# Patient Record
Sex: Female | Born: 1945 | Race: White | Hispanic: No | Smoking: Current some day smoker
Health system: Southern US, Community
[De-identification: ages and names within clinical notes are randomized; demographics above are authoritative.]

## PROBLEM LIST (undated history)

## (undated) ENCOUNTER — Emergency Department (HOSPITAL_COMMUNITY): Admission: EM | Source: Home / Self Care

## (undated) DIAGNOSIS — K219 Gastro-esophageal reflux disease without esophagitis: Secondary | ICD-10-CM

## (undated) DIAGNOSIS — R058 Other specified cough: Secondary | ICD-10-CM

## (undated) DIAGNOSIS — M1990 Unspecified osteoarthritis, unspecified site: Secondary | ICD-10-CM

## (undated) DIAGNOSIS — E785 Hyperlipidemia, unspecified: Secondary | ICD-10-CM

## (undated) DIAGNOSIS — K429 Umbilical hernia without obstruction or gangrene: Secondary | ICD-10-CM

## (undated) DIAGNOSIS — J449 Chronic obstructive pulmonary disease, unspecified: Secondary | ICD-10-CM

## (undated) DIAGNOSIS — F40240 Claustrophobia: Secondary | ICD-10-CM

## (undated) DIAGNOSIS — I739 Peripheral vascular disease, unspecified: Secondary | ICD-10-CM

## (undated) DIAGNOSIS — I82409 Acute embolism and thrombosis of unspecified deep veins of unspecified lower extremity: Secondary | ICD-10-CM

## (undated) DIAGNOSIS — E119 Type 2 diabetes mellitus without complications: Secondary | ICD-10-CM

## (undated) DIAGNOSIS — F32A Depression, unspecified: Secondary | ICD-10-CM

## (undated) DIAGNOSIS — R001 Bradycardia, unspecified: Secondary | ICD-10-CM

## (undated) DIAGNOSIS — R0989 Other specified symptoms and signs involving the circulatory and respiratory systems: Secondary | ICD-10-CM

## (undated) DIAGNOSIS — M79606 Pain in leg, unspecified: Secondary | ICD-10-CM

## (undated) DIAGNOSIS — I2510 Atherosclerotic heart disease of native coronary artery without angina pectoris: Secondary | ICD-10-CM

## (undated) DIAGNOSIS — I6529 Occlusion and stenosis of unspecified carotid artery: Secondary | ICD-10-CM

## (undated) DIAGNOSIS — F329 Major depressive disorder, single episode, unspecified: Secondary | ICD-10-CM

## (undated) DIAGNOSIS — R05 Cough: Secondary | ICD-10-CM

## (undated) DIAGNOSIS — F419 Anxiety disorder, unspecified: Secondary | ICD-10-CM

## (undated) HISTORY — DX: Atherosclerotic heart disease of native coronary artery without angina pectoris: I25.10

## (undated) HISTORY — DX: Pain in leg, unspecified: M79.606

## (undated) HISTORY — DX: Acute embolism and thrombosis of unspecified deep veins of unspecified lower extremity: I82.409

## (undated) HISTORY — DX: Bradycardia, unspecified: R00.1

## (undated) HISTORY — PX: CYSTECTOMY: SUR359

## (undated) HISTORY — DX: Unspecified osteoarthritis, unspecified site: M19.90

## (undated) HISTORY — PX: PAROTIDECTOMY: SUR1003

## (undated) HISTORY — PX: CARDIAC CATHETERIZATION: SHX172

## (undated) HISTORY — DX: Chronic obstructive pulmonary disease, unspecified: J44.9

## (undated) HISTORY — DX: Hyperlipidemia, unspecified: E78.5

## (undated) HISTORY — DX: Occlusion and stenosis of unspecified carotid artery: I65.29

## (undated) HISTORY — PX: TONSILLECTOMY AND ADENOIDECTOMY: SUR1326

## (undated) HISTORY — DX: Peripheral vascular disease, unspecified: I73.9

## (undated) HISTORY — DX: Other specified symptoms and signs involving the circulatory and respiratory systems: R09.89

## (undated) HISTORY — DX: Cough: R05

## (undated) HISTORY — DX: Other specified cough: R05.8

## (undated) HISTORY — PX: OTHER SURGICAL HISTORY: SHX169

---

## 1966-05-29 HISTORY — PX: ABDOMINAL HYSTERECTOMY: SHX81

## 1966-05-29 HISTORY — PX: SALPINGOOPHORECTOMY: SHX82

## 1975-05-30 HISTORY — PX: APPENDECTOMY: SHX54

## 2002-04-23 ENCOUNTER — Ambulatory Visit (HOSPITAL_COMMUNITY): Admission: RE | Admit: 2002-04-23 | Discharge: 2002-04-23 | Payer: Self-pay | Admitting: Pulmonary Disease

## 2002-05-01 ENCOUNTER — Ambulatory Visit (HOSPITAL_COMMUNITY): Admission: RE | Admit: 2002-05-01 | Discharge: 2002-05-01 | Payer: Self-pay | Admitting: Pulmonary Disease

## 2002-12-27 ENCOUNTER — Ambulatory Visit (HOSPITAL_COMMUNITY): Admission: RE | Admit: 2002-12-27 | Discharge: 2002-12-27 | Payer: Self-pay | Admitting: Pulmonary Disease

## 2003-06-06 ENCOUNTER — Emergency Department (HOSPITAL_COMMUNITY): Admission: EM | Admit: 2003-06-06 | Discharge: 2003-06-06 | Payer: Self-pay | Admitting: Emergency Medicine

## 2003-06-06 ENCOUNTER — Encounter: Payer: Self-pay | Admitting: Emergency Medicine

## 2003-06-18 ENCOUNTER — Ambulatory Visit (HOSPITAL_COMMUNITY): Admission: RE | Admit: 2003-06-18 | Discharge: 2003-06-18 | Payer: Self-pay | Admitting: Pulmonary Disease

## 2003-12-27 ENCOUNTER — Ambulatory Visit (HOSPITAL_COMMUNITY): Admission: RE | Admit: 2003-12-27 | Discharge: 2003-12-27 | Payer: Self-pay | Admitting: Pulmonary Disease

## 2005-05-10 ENCOUNTER — Emergency Department (HOSPITAL_COMMUNITY): Admission: EM | Admit: 2005-05-10 | Discharge: 2005-05-10 | Payer: Self-pay | Admitting: Emergency Medicine

## 2005-08-25 ENCOUNTER — Other Ambulatory Visit: Admission: RE | Admit: 2005-08-25 | Discharge: 2005-08-25 | Payer: Self-pay | Admitting: Otolaryngology

## 2005-09-13 ENCOUNTER — Other Ambulatory Visit: Admission: RE | Admit: 2005-09-13 | Discharge: 2005-09-13 | Payer: Self-pay | Admitting: Otolaryngology

## 2005-11-08 ENCOUNTER — Ambulatory Visit (HOSPITAL_COMMUNITY): Admission: RE | Admit: 2005-11-08 | Discharge: 2005-11-08 | Payer: Self-pay | Admitting: Otolaryngology

## 2006-01-24 ENCOUNTER — Ambulatory Visit (HOSPITAL_COMMUNITY): Admission: RE | Admit: 2006-01-24 | Discharge: 2006-01-24 | Payer: Self-pay | Admitting: Otolaryngology

## 2007-01-10 ENCOUNTER — Ambulatory Visit (HOSPITAL_COMMUNITY): Admission: RE | Admit: 2007-01-10 | Discharge: 2007-01-10 | Payer: Self-pay | Admitting: Pulmonary Disease

## 2007-03-25 ENCOUNTER — Ambulatory Visit: Payer: Self-pay | Admitting: Cardiology

## 2007-03-25 ENCOUNTER — Inpatient Hospital Stay (HOSPITAL_COMMUNITY): Admission: EM | Admit: 2007-03-25 | Discharge: 2007-03-27 | Payer: Self-pay | Admitting: Cardiology

## 2007-03-27 ENCOUNTER — Encounter: Payer: Self-pay | Admitting: Cardiology

## 2007-04-03 ENCOUNTER — Encounter: Payer: Self-pay | Admitting: Physician Assistant

## 2007-04-03 ENCOUNTER — Ambulatory Visit: Payer: Self-pay | Admitting: Cardiology

## 2007-05-09 ENCOUNTER — Encounter: Payer: Self-pay | Admitting: Physician Assistant

## 2007-12-03 ENCOUNTER — Encounter: Payer: Self-pay | Admitting: Cardiology

## 2008-01-04 ENCOUNTER — Ambulatory Visit (HOSPITAL_COMMUNITY): Admission: RE | Admit: 2008-01-04 | Discharge: 2008-01-04 | Payer: Self-pay | Admitting: Pulmonary Disease

## 2008-03-11 ENCOUNTER — Encounter (INDEPENDENT_AMBULATORY_CARE_PROVIDER_SITE_OTHER): Payer: Self-pay | Admitting: Interventional Radiology

## 2008-03-11 ENCOUNTER — Ambulatory Visit (HOSPITAL_COMMUNITY): Admission: RE | Admit: 2008-03-11 | Discharge: 2008-03-11 | Payer: Self-pay | Admitting: Otolaryngology

## 2008-05-16 ENCOUNTER — Inpatient Hospital Stay (HOSPITAL_COMMUNITY): Admission: AD | Admit: 2008-05-16 | Discharge: 2008-05-17 | Payer: Self-pay | Admitting: Otolaryngology

## 2008-05-16 ENCOUNTER — Encounter (INDEPENDENT_AMBULATORY_CARE_PROVIDER_SITE_OTHER): Payer: Self-pay | Admitting: Otolaryngology

## 2009-04-14 ENCOUNTER — Ambulatory Visit (HOSPITAL_COMMUNITY): Admission: RE | Admit: 2009-04-14 | Discharge: 2009-04-14 | Payer: Self-pay | Admitting: Pulmonary Disease

## 2009-06-12 IMAGING — CR DG LUMBAR SPINE COMPLETE 4+V
5 series · 5 of 5 positions shown · non-contrast
Comparison: CT abdomen 01/10/2007

CLINICAL DATA: Back pain, bilateral leg pain

LUMBAR SPINE - COMPLETE 4+ VIEW

[view not recorded (1 of 5)]
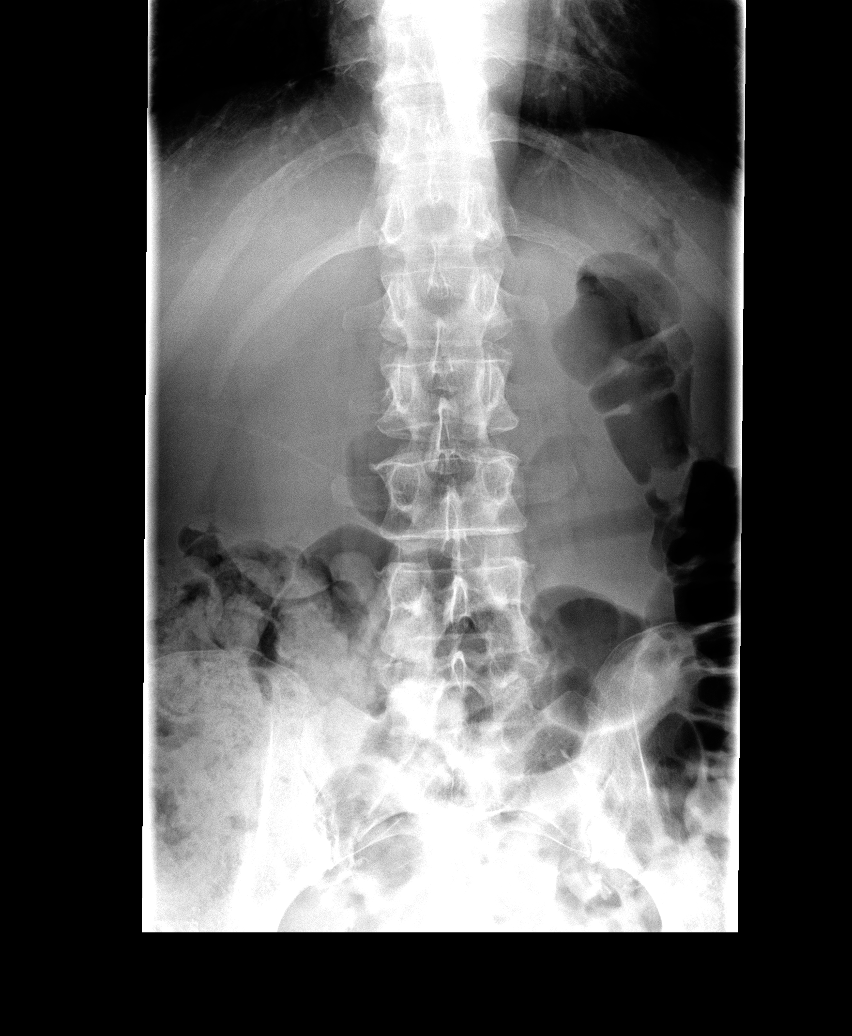

[view not recorded (2 of 5)]
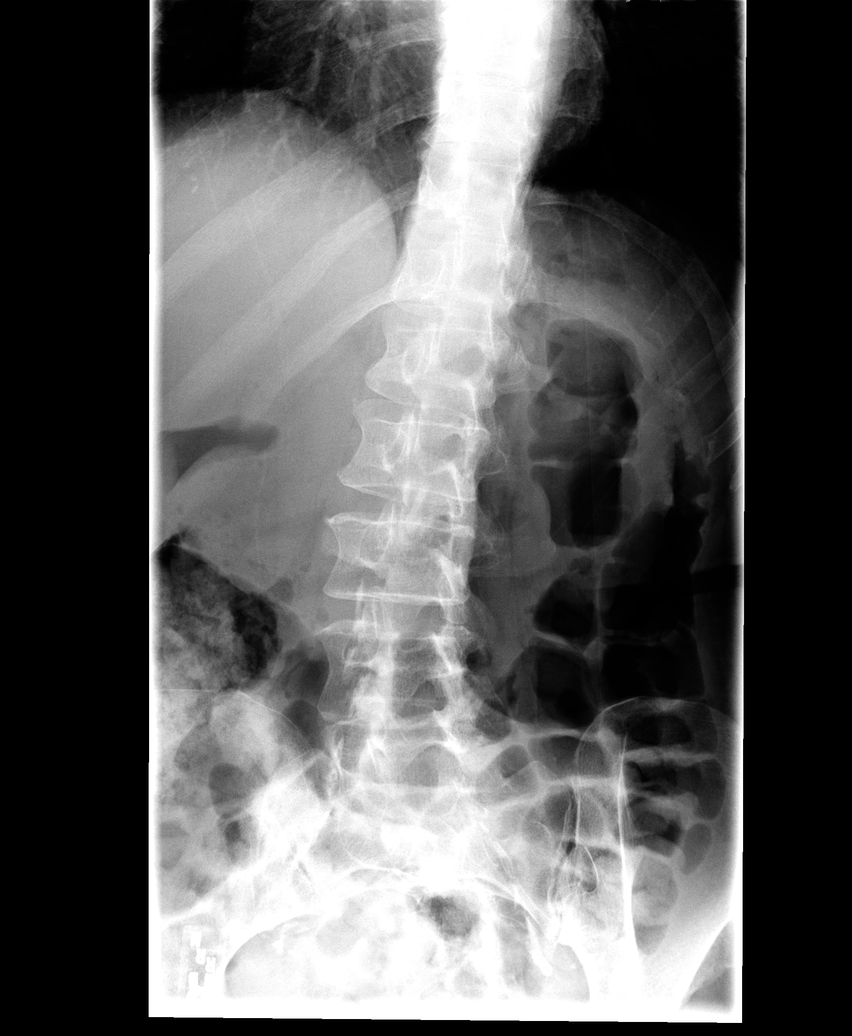

[view not recorded (3 of 5)]
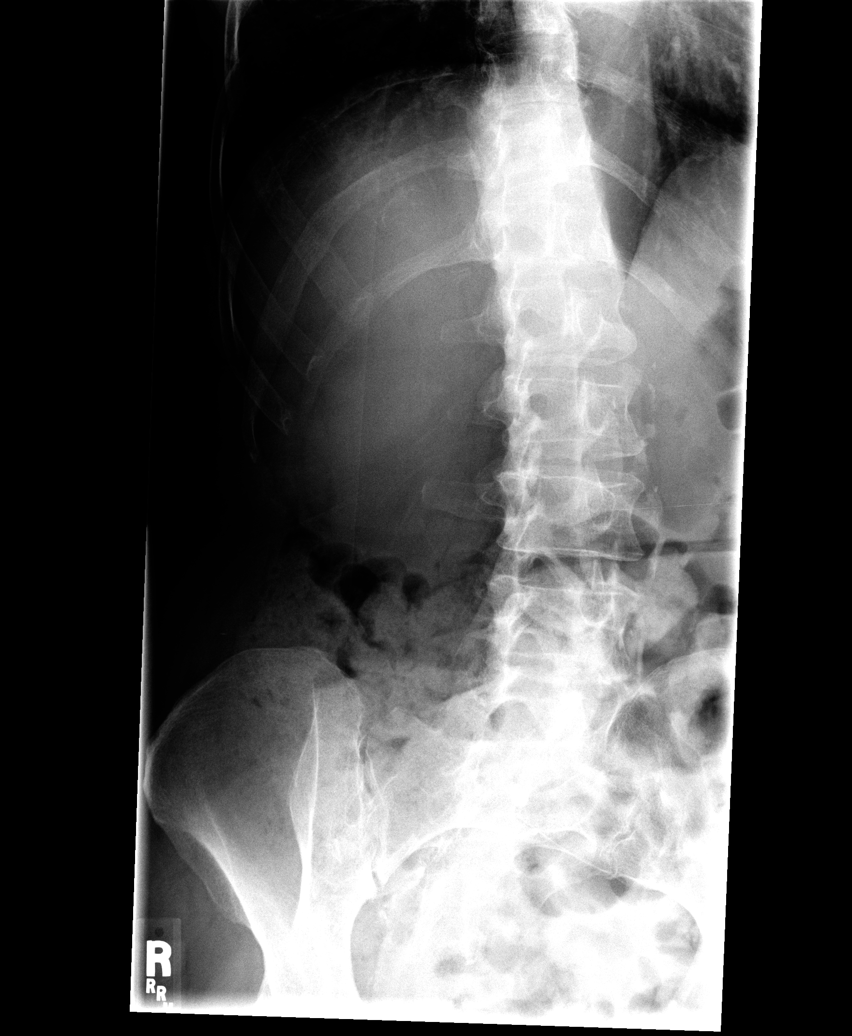

[view not recorded (4 of 5)]
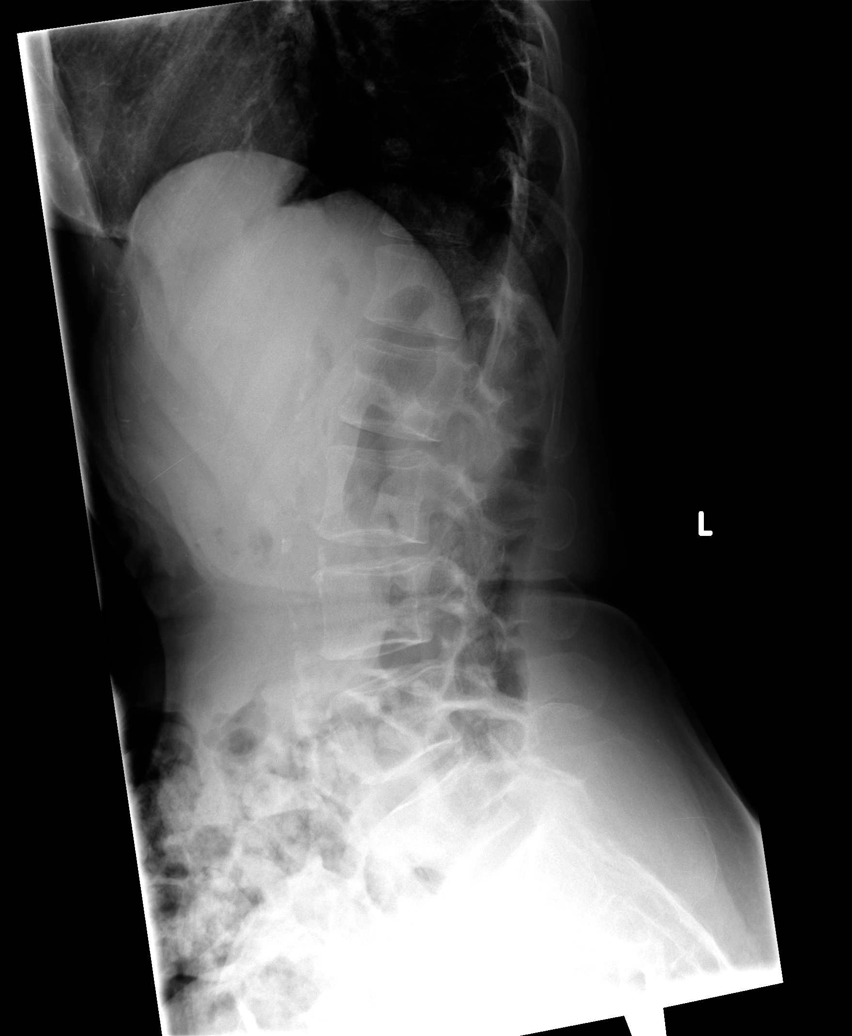

[view not recorded (5 of 5)]
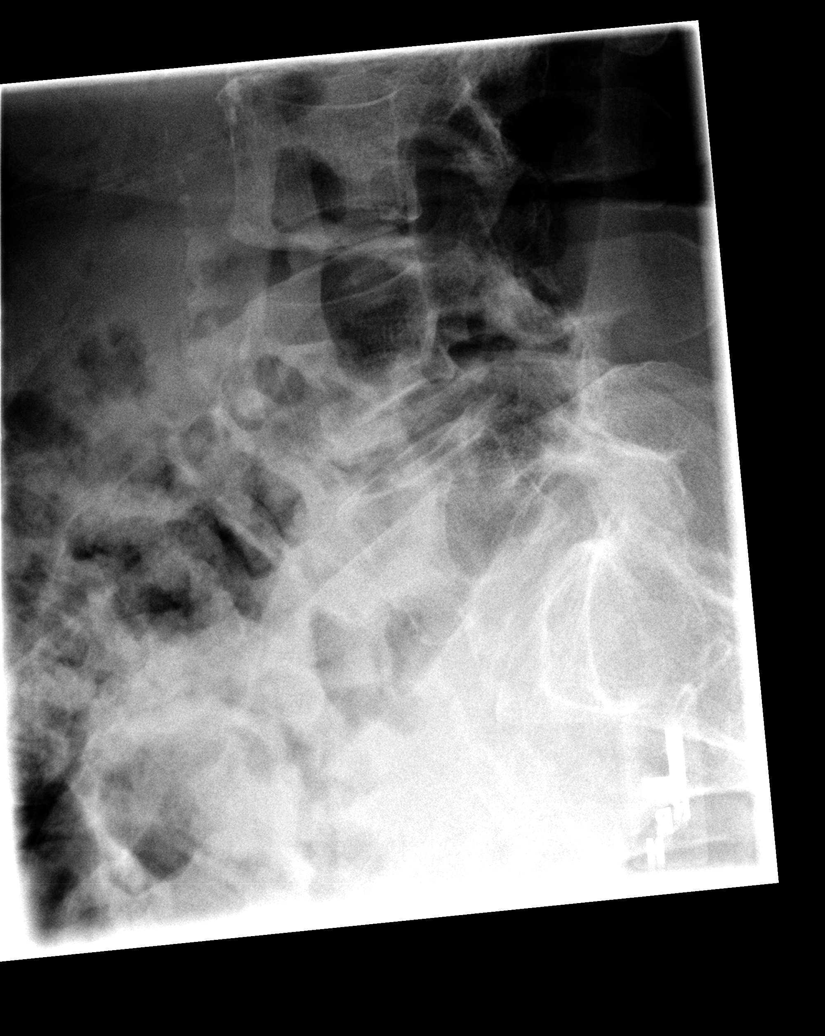

[5 of 5 positions shown; findings below may reference images not displayed]

FINDINGS: There are five lumbar-type vertebra.  No evidence of
subluxation on lateral projection.  No loss of disc height or
vertebral body height.  No evidence of pars defect on lateral
projection.  Calcification abdominal aorta noted.
IMPRESSION: No acute findings of the lumbar spine.

## 2009-07-08 ENCOUNTER — Ambulatory Visit (HOSPITAL_COMMUNITY): Admission: RE | Admit: 2009-07-08 | Discharge: 2009-07-08 | Payer: Self-pay | Admitting: Pulmonary Disease

## 2009-08-06 ENCOUNTER — Encounter: Payer: Self-pay | Admitting: Cardiology

## 2009-08-06 ENCOUNTER — Ambulatory Visit: Payer: Self-pay | Admitting: Vascular Surgery

## 2009-08-22 ENCOUNTER — Ambulatory Visit (HOSPITAL_COMMUNITY): Admission: RE | Admit: 2009-08-22 | Discharge: 2009-08-22 | Payer: Self-pay | Admitting: Vascular Surgery

## 2009-08-22 ENCOUNTER — Ambulatory Visit: Payer: Self-pay | Admitting: Vascular Surgery

## 2009-09-10 ENCOUNTER — Ambulatory Visit: Payer: Self-pay | Admitting: Vascular Surgery

## 2009-09-10 ENCOUNTER — Encounter: Payer: Self-pay | Admitting: Cardiology

## 2010-03-25 ENCOUNTER — Ambulatory Visit: Payer: Self-pay | Admitting: Vascular Surgery

## 2010-04-09 ENCOUNTER — Encounter (INDEPENDENT_AMBULATORY_CARE_PROVIDER_SITE_OTHER): Payer: Self-pay | Admitting: *Deleted

## 2010-04-09 ENCOUNTER — Ambulatory Visit: Payer: Self-pay | Admitting: Cardiology

## 2010-04-09 DIAGNOSIS — R072 Precordial pain: Secondary | ICD-10-CM

## 2010-04-09 DIAGNOSIS — R42 Dizziness and giddiness: Secondary | ICD-10-CM

## 2010-04-09 DIAGNOSIS — E119 Type 2 diabetes mellitus without complications: Secondary | ICD-10-CM | POA: Insufficient documentation

## 2010-04-09 DIAGNOSIS — I6529 Occlusion and stenosis of unspecified carotid artery: Secondary | ICD-10-CM

## 2010-04-09 DIAGNOSIS — I7389 Other specified peripheral vascular diseases: Secondary | ICD-10-CM

## 2010-04-14 ENCOUNTER — Encounter: Payer: Self-pay | Admitting: Cardiology

## 2010-04-14 ENCOUNTER — Ambulatory Visit: Payer: Self-pay | Admitting: Cardiology

## 2010-04-17 ENCOUNTER — Telehealth (INDEPENDENT_AMBULATORY_CARE_PROVIDER_SITE_OTHER): Payer: Self-pay | Admitting: *Deleted

## 2010-05-05 ENCOUNTER — Ambulatory Visit (HOSPITAL_COMMUNITY): Admission: RE | Admit: 2010-05-05 | Discharge: 2010-05-05 | Payer: Self-pay | Admitting: Pulmonary Disease

## 2010-05-21 ENCOUNTER — Ambulatory Visit: Payer: Self-pay | Admitting: Cardiology

## 2010-10-07 ENCOUNTER — Ambulatory Visit (HOSPITAL_COMMUNITY)
Admission: RE | Admit: 2010-10-07 | Discharge: 2010-10-07 | Payer: Self-pay | Source: Home / Self Care | Admitting: Pulmonary Disease

## 2010-10-07 ENCOUNTER — Encounter: Payer: Self-pay | Admitting: Cardiology

## 2010-10-20 ENCOUNTER — Ambulatory Visit: Payer: Self-pay | Admitting: Cardiology

## 2010-10-20 DIAGNOSIS — I82409 Acute embolism and thrombosis of unspecified deep veins of unspecified lower extremity: Secondary | ICD-10-CM | POA: Insufficient documentation

## 2010-12-16 ENCOUNTER — Ambulatory Visit
Admission: RE | Admit: 2010-12-16 | Discharge: 2010-12-16 | Payer: Self-pay | Source: Home / Self Care | Attending: Gastroenterology | Admitting: Gastroenterology

## 2010-12-16 ENCOUNTER — Encounter: Payer: Self-pay | Admitting: Internal Medicine

## 2010-12-16 ENCOUNTER — Ambulatory Visit (HOSPITAL_COMMUNITY)
Admission: RE | Admit: 2010-12-16 | Discharge: 2010-12-16 | Payer: Self-pay | Source: Home / Self Care | Attending: Internal Medicine | Admitting: Internal Medicine

## 2010-12-16 DIAGNOSIS — K5900 Constipation, unspecified: Secondary | ICD-10-CM | POA: Insufficient documentation

## 2010-12-17 ENCOUNTER — Encounter: Payer: Self-pay | Admitting: Internal Medicine

## 2010-12-25 LAB — BASIC METABOLIC PANEL
CO2: 30 mEq/L (ref 19–32)
Calcium: 9.5 mg/dL (ref 8.4–10.5)
Chloride: 95 mEq/L — ABNORMAL LOW (ref 96–112)
GFR calc Af Amer: >60 mL/min (ref >60)
Glucose, Bld: 139 mg/dL — ABNORMAL HIGH (ref 70–99)
Potassium: 4.8 mEq/L (ref 3.5–5.1)
Sodium: 134 mEq/L — ABNORMAL LOW (ref 135–145)

## 2010-12-29 NOTE — Letter (Signed)
Summary: [facility] D/C   [facility] D/C   Imported By: Zachary George 04/09/2010 09:49:42  _____________________________________________________________________  External Attachment:    Type:   Image     Comment:   External Document

## 2010-12-29 NOTE — Progress Notes (Signed)
Summary: panic attacks   Phone Note Call from Patient   Summary of Call: Message left on voice mail wanting to know if anything in stress test would cause her to have panic attacks.  Left message to return call.  Melissa Brunette, LPN  Apr 17, 2010 3:49 PM     Follow-up for Phone Call        Patient notified to f/u with PMD regarding her panic attacks.  States she will see her PMD on Monday.   (Dr. [name])

## 2010-12-29 NOTE — Assessment & Plan Note (Signed)
Summary: f/u - discuss poss. further testing  --agh   Visit Type:  Follow-up Primary Provider:  [name]   History of Present Illness: the patient is a 65 year old female with history of single vessel coronary artery disease with an occlusion of the RCA and distal collaterals. The patient also hypertension diabetes mellitus. She has severe peripheral vascular disease with right leg claudication. She also has left carotid artery disease and she is followed by Dr. Darrick Penna in [place].  The patient reports occasional atypical chest pain. However she reports no angina on exertion. Had a recent myocardial perfusion imaging study done which was somewhat nondiagnostic. There was a small fixed defect in the apical anterior and apical segment. However this was a small defect. There was also small defect in the mid inferior wall consistent with her prior history of RCA occlusion. Ejection fraction however is preserved at 67%. The patient describes some pain in the left side of the chest consistent musculoskeletal pain. She denies any orthopnea PND palpitations or syncope.  Preventive Screening-Counseling & Management  Alcohol-Tobacco     Smoking Status: current     Smoking Cessation Counseling: yes     Packs/Day: 1/2 PPD  Current Medications (verified): 1)  Simvastatin 40 Mg Tabs (Simvastatin) .... Take One Tablet By Mouth Daily At Bedtime 2)  Metformin Hcl 500 Mg Tabs (Metformin Hcl) .... Take 2 Tablet By Mouth Two Times A Day 3)  Aspir-Low 81 Mg Tbec (Aspirin) .... Take 1 Tablet By Mouth Once A Day 4)  Centrum Silver  Tabs (Multiple Vitamins-Minerals) .... Take 1 Tablet By Mouth Once A Day 5)  Hydrocodone-Acetaminophen 7.5-750 Mg Tabs (Hydrocodone-Acetaminophen) .... Take 1 Tablet By Mouth Four Times A Day 6)  Alprazolam 1 Mg Tabs (Alprazolam) .... Take 1 Tablet By Mouth Four Times A Day 7)  Albuterol Sulfate (2.5 Mg/43ml) 0.083% Nebu (Albuterol Sulfate) .... One Neb Tx Four Times A Day As  Needed 8)  Metoprolol Succinate 25 Mg Xr24h-Tab (Metoprolol Succinate) .... Take 1/2 Tablet By Mouth Once A Day 9)  Nitrostat 0.4 Mg Subl (Nitroglycerin) .... Use As Directed As Needed Chest Pain 10)  Furosemide 20 Mg Tabs (Furosemide) .... Take 1 Tablet By Mouth Once A Day As Needed 11)  Potassium Chloride Cr 10 Meq Cr-Caps (Potassium Chloride) .... Take 1 Tablet By Mouth Once A Day As Needed 12)  Proventil Hfa 108 (90 Base) Mcg/act Aers (Albuterol Sulfate) .... 2 Puffs Four Times A Day As Needed  Allergies (verified): 1)  ! Pcn 2)  ! Sulfa 3)  ! Paxil 4)  ! * Azithromycin 5)  ! * Chantix  Comments:  Nurse/Medical Assistant: The patient's medication list and allergies were reviewed with the patient and were updated in the Medication and Allergy Lists.  Past History:  Past Medical History: Last updated: 04/09/2010 severe single-vessel coronary artery disease 100% occluded right coronary artery with distal collateralization. Medical therapy Normal LV function Bilateral carotid bruits Sinus bradycardia Type 2 diabetes mellitus Dyslipidemia Severe COPD Anxiety disorder with history of panic attacks Remote portable as him and deep vein thrombosis  Family History: Last updated: 04/09/2010 noncontributory  Social History: Last updated: 04/09/2010 Retired   Risk Factors: Smoking Status: current (05/21/2010) Packs/Day: 1/2 PPD (05/21/2010)  Social History: Smoking Status:  current Packs/Day:  1/2 PPD  Vital Signs:  Patient profile:   65 year old female Height:      64 inches Weight:      122 pounds Pulse rate:   64 / minute  BP sitting:   150 / 70  (left arm) Cuff size:   regular  Vitals Entered By: Carlye Grippe (May 21, 2010 1:10 PM)  Physical Exam  Additional Exam:  General: Well-developed, well-nourished in no distress head: Normocephalic and atraumatic eyes PERRLA/EOMI intact, conjunctiva and lids normal nose: No deformity or lesions mouth normal  dentition, normal posterior pharynx neck: Supple, no JVD.  No masses, thyromegaly or abnormal cervical nodes, left carotid bruit lungs: Normal breath sounds bilaterally without wheezing.  Normal percussion heart: regular rate and rhythm with normal S1 and S2, no S3 or S4.  PMI is normal.  No pathological murmurs abdomen: Normal bowel sounds, abdomen is soft and nontender without masses, organomegaly or hernias noted.  No hepatosplenomegaly musculoskeletal: Back normal, normal gait muscle strength and tone normal pulsus:unable to palpate pulses dorsalis pedis and posterior tibial bilaterally Extremities: No peripheral pitting edema neurologic: Alert and oriented x 3 skin: Intact without lesions or rashes cervical nodes: No significant adenopathy psychologic: Normal affect    Impression & Recommendations:  Problem # 1:  PVD WITH CLAUDICATION (ICD-443.89) the patient has significant peripheral vascular disease including carotid artery disease. She is followed in [place] and if the patient needs surgery from a cardiac perspective she should be stable.  Problem # 2:  PRECORDIAL PAIN (ICD-786.51) the patient's chest pain is atypical. There may be a mild degree of angina which is associated with stress. The patient occasionally uses nitroglycerin. However her MPI studywas low risk. I will continue to manage her medically. Her updated medication list for this problem includes:    Aspir-low 81 Mg Tbec (Aspirin) .Marland Kitchen... Take 1 tablet by mouth once a day    Metoprolol Succinate 25 Mg Xr24h-tab (Metoprolol succinate) .Marland Kitchen... Take 1/2 tablet by mouth once a day    Nitrostat 0.4 Mg Subl (Nitroglycerin) ..... Use as directed as needed chest pain  Problem # 3:  CAROTID ARTERY DISEASE (ICD-433.10) Assessment: Comment Only  Her updated medication list for this problem includes:    Aspir-low 81 Mg Tbec (Aspirin) .Marland Kitchen... Take 1 tablet by mouth once a day  Problem # 4:  ORTHOSTATIC DIZZINESS  (ICD-780.4) Assessment: Comment Only resolved.  Patient Instructions: 1)  Your physician wants you to follow-up in: 6 months. You will receive a reminder letter in the mail one-two months in advance. If you don't receive a letter, please call our office to schedule the follow-up appointment. 2)  Your physician recommends that you continue on your current medications as directed. Please refer to the Current Medication list given to you today.

## 2010-12-29 NOTE — Letter (Signed)
Summary: External Correspondence/ OFFICE VISIT VASCULAR & VEIN  External Correspondence/ OFFICE VISIT VASCULAR & VEIN   Imported By: Dorise Hiss 04/29/2010 14:49:18  _____________________________________________________________________  External Attachment:    Type:   Image     Comment:   External Document

## 2010-12-29 NOTE — Assessment & Plan Note (Signed)
Summary: 6 MO FU   Visit Type:  Follow-up Primary Melissa Holland:  Melissa Holland   History of Present Illness: 2 weeks ago fell at [facility] 3 days later studies. 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  I discussed this with Dr. Darrick Penna.   100% occluded right coronary artery with distal collateralization. Medical therapy patient had recent carotid Dopplers done April 2011 right internal carotid artery 40-59% left internal carotid artery 60-79%. No significant change from prior study. Chest x-ray November hyperinflation venous Doppler studies done November 2011 with chronic DVT in right common femoral vein prominent peripheral vascular disease with severe stenosis of the popliteal artery. not sure why patient is not on Coumadin   the patient is a 65 year old female with history of single vessel coronary artery disease with an occlusion of the RCA and distal collaterals. The patient also hypertension diabetes mellitus. She has severe peripheral vascular disease with right leg claudication. She also has left carotid artery disease and she is followed by Dr. Darrick Penna in [place]. The patient reports occasional atypical chest pain. However she reports no angina on exertion. Had a recent myocardial perfusion imaging study done which was somewhat nondiagnostic. There was a small fixed defect in the apical anterior and apical segment. However this was a small defect. There was also small defect in the mid inferior wall consistent with her prior history of RCA occlusion. Ejection fraction however is preserved at 67%. The patient describes some pain in the left side of the chest consistent musculoskeletal pain. She denies any orthopnea PND palpitations or syncope.  Preventive Screening-Counseling & Management  Alcohol-Tobacco     Smoking Status: current     Smoking Cessation Counseling: yes     Packs/Day: 1/2 PPD  Current Medications (verified): 1)  Simvastatin 40 Mg  Tabs (Simvastatin) .... Take One Tablet By Mouth Daily At Bedtime 2)  Metformin Hcl 500 Mg Tabs (Metformin Hcl) .... Take 2 Tablet By Mouth Two Times A Day 3)  Aspir-Low 81 Mg Tbec (Aspirin) .... Take 1 Tablet By Mouth Once A Day 4)  Centrum Silver  Tabs (Multiple Vitamins-Minerals) .... Take 1 Tablet By Mouth Once A Day 5)  Hydrocodone-Acetaminophen 7.5-325 Mg Tabs (Hydrocodone-Acetaminophen) .... Take 1 Tablet By Mouth Four Times A Day As Needed 6)  Alprazolam 1 Mg Tabs (Alprazolam) .... Take 1 Tablet By Mouth Four Times A Day 7)  Albuterol Sulfate (2.5 Mg/57ml) 0.083% Nebu (Albuterol Sulfate) .... One Neb Tx Four Times A Day As Needed 8)  Metoprolol Succinate 25 Mg Xr24h-Tab (Metoprolol Succinate) .... Take 1/2 Tablet By Mouth Once A Day 9)  Nitrostat 0.4 Mg Subl (Nitroglycerin) .... Use As Directed As Needed Chest Pain 10)  Furosemide 20 Mg Tabs (Furosemide) .... Take 1 Tablet By Mouth Once A Day As Needed 11)  Potassium Chloride Cr 10 Meq Cr-Caps (Potassium Chloride) .... Take 1 Tablet By Mouth Once A Day As Needed 12)  Proventil Hfa 108 (90 Base) Mcg/act Aers (Albuterol Sulfate) .... 2 Puffs Four Times A Day As Needed 13)  Ibuprofen 800 Mg Tabs (Ibuprofen) .... Take 1 Tablet By Mouth Once A Day As Needed  Allergies (verified): 1)  ! Pcn 2)  ! Sulfa 3)  ! Paxil 4)  ! * Azithromycin 5)  ! * Chantix  Comments:  Nurse/Medical Assistant: The patient's medication list and allergies were reviewed with the patient and were updated in the Medication and Allergy Lists.  Past History:  Past Medical History:  Last updated: 04/09/2010 severe single-vessel coronary artery disease 100% occluded right coronary artery with distal collateralization. Medical therapy Normal LV function Bilateral carotid bruits Sinus bradycardia Type 2 diabetes mellitus Dyslipidemia Severe COPD Anxiety disorder with history of panic attacks Remote portable as him and deep vein thrombosis  Family  History: Last updated: 10/20/2010 noncontributory Negative FH of Diabetes, Hypertension, or Coronary Artery Disease  Social History: Last updated: 04/09/2010 Retired   Risk Factors: Smoking Status: current (10/20/2010) Packs/Day: 1/2 PPD (10/20/2010)  Family History: noncontributory Negative FH of Diabetes, Hypertension, or Coronary Artery Disease  Review of Systems       The patient complains of fatigue and shortness of breath.  The patient denies malaise, fever, weight gain/loss, vision loss, decreased hearing, hoarseness, chest pain, palpitations, prolonged cough, wheezing, sleep apnea, coughing up blood, abdominal pain, blood in stool, nausea, vomiting, diarrhea, heartburn, incontinence, blood in urine, muscle weakness, joint pain, leg swelling, rash, skin lesions, headache, fainting, dizziness, depression, anxiety, enlarged lymph nodes, easy bruising or bleeding, and environmental allergies.    Vital Signs:  Patient profile:   65 year old female Height:      64 inches Weight:      122 pounds Pulse rate:   57 / minute BP sitting:   133 / 76  (left arm) Cuff size:   regular  Vitals Entered By: Carlye Grippe (October 20, 2010 10:39 AM)  Physical Exam  Additional Exam:  General: Well-developed, well-nourished in no distress head: Normocephalic and atraumatic eyes PERRLA/EOMI intact, conjunctiva and lids normal nose: No deformity or lesions mouth normal dentition, normal posterior pharynx neck: Supple, no JVD.  No masses, thyromegaly or abnormal cervical nodes, left carotid bruit lungs: Normal breath sounds bilaterally without wheezing.  Normal percussion heart: regular rate and rhythm with normal S1 and S2, no S3 or S4.  PMI is normal.  No pathological murmurs abdomen: Normal bowel sounds, abdomen is soft and nontender without masses, organomegaly or hernias noted.  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  She is however asymptomatic.  I discussed this with Dr. Darrick Penna.  No indication for Coumadin currently.  Problem # 2:  CAROTID ARTERY DISEASE (ICD-433.10) stable carotid artery disease.  Followed by vascular surgery Her updated medication list for this problem includes:    Aspir-low 81 Mg Tbec (Aspirin) .Marland Kitchen... Take 1 tablet by mouth once a day  Problem # 3:  PRECORDIAL PAIN (ICD-786.51) the patient has a history of coronary artery disease.  However she appears to be stable.  I made no medication changes. Her updated medication list for this problem includes:    Aspir-low 81 Mg Tbec (Aspirin) .Marland Kitchen... Take 1 tablet by mouth once a day    Metoprolol Succinate 25 Mg Xr24h-tab (Metoprolol succinate) .Marland Kitchen... Take 1/2 tablet by mouth once a day    Nitrostat 0.4 Mg Subl (Nitroglycerin) ..... Use as directed as needed chest pain  Other Orders: EKG w/ Interpretation (93000)  Patient Instructions: 1)  Your physician recommends that you continue on your current medications as directed. Please refer to the Current Medication list given to you today. 2)  Follow up in  6 months

## 2010-12-29 NOTE — Assessment & Plan Note (Signed)
Summary: EST-CORN DISEASE   CHEST PAIN   Visit Type:  Follow-up Primary Provider:  [name]  CC:  follow-up visit.  History of Present Illness: the patient is a 65 year old female with a history of single-vessel coronary artery disease, with 100% occlusion of the RCA and distal collaterals. Her cardiac catheterization was several years ago. The patient also has hypertension and diabetes mellitus. She has been diagnosed with lymphadenopathy in the left neck. Currently she had a biopsy. She also describes a possible cyst drainage procedure. I do not have any records on this.  The patient in addition has severe peripheral vascular disease which significant right leg claudication. She saw Dr. Darrick Penna in [place] and not a candidate for percutaneous intervention . She may be a future bypass candidate. She also had recent ABIs and carotid Dopplers done. Unfortunately I do not have those results available either.  The patient has now been referred because of substernal chest pain. The patient describes left-sided chest pain sometimes with radiation to the left neck area and associated with headache. She states other symptoms occur particularly at exertion. She denies any resting chest pain. There is some radiation of the pain in the medial aspect of the left upper arm. There is associated shortness of breath. She denies any diaphoresis. Of note is that the patient has significant emphysema with chronic dyspnea.the patient also describes dizziness upon standing.she also describes upon standing a left-sided neck pain and shooting pain on the left side of the head, followed by pain alternating between both temples. She states when she exerts herself too much or is under stress, actually the pain will start in the chest and moving a similar pattern.  Current Medications (verified): 1)  Simvastatin 40 Mg Tabs (Simvastatin) .... Take One Tablet By Mouth Daily At Bedtime 2)  Metformin Hcl 500 Mg  Tabs (Metformin Hcl) .... Take 2 Tablet By Mouth Two Times A Day 3)  Aspir-Low 81 Mg Tbec (Aspirin) .... Take 1 Tablet By Mouth Once A Day 4)  Centrum Silver  Tabs (Multiple Vitamins-Minerals) .... Take 1 Tablet By Mouth Once A Day 5)  Hydrocodone-Acetaminophen 7.5-750 Mg Tabs (Hydrocodone-Acetaminophen) .... Take 1 Tablet By Mouth Four Times A Day 6)  Alprazolam 1 Mg Tabs (Alprazolam) .... Take 1 Tablet By Mouth Four Times A Day 7)  Albuterol Sulfate (2.5 Mg/6ml) 0.083% Nebu (Albuterol Sulfate) .... One Neb Tx Four Times A Day As Needed 8)  Metoprolol Succinate 25 Mg Xr24h-Tab (Metoprolol Succinate) .... Take 1/2 Tablet By Mouth Once A Day 9)  Nitrostat 0.4 Mg Subl (Nitroglycerin) .... Use As Directed As Needed Chest Pain 10)  Ibuprofen 800 Mg Tabs (Ibuprofen) .... Take 1 Tablet By Mouth Three Times A Day As Needed 11)  Furosemide 20 Mg Tabs (Furosemide) .... Take 1 Tablet By Mouth Once A Day As Needed 12)  Potassium Chloride Cr 10 Meq Cr-Caps (Potassium Chloride) .... Take 1 Tablet By Mouth Once A Day As Needed 13)  Proventil Hfa 108 (90 Base) Mcg/act Aers (Albuterol Sulfate) .... 2 Puffs Four Times A Day As Needed  Allergies (verified): 1)  ! Pcn 2)  ! Sulfa 3)  ! Paxil 4)  ! * Azithromycin 5)  ! * Chantix  Comments:  Nurse/Medical Assistant: The patient's medications and allergies were reviewed with the patient and were updated in the Medication and Allergy Lists. List reviewed.  Past History:  Past Medical History: severe single-vessel coronary artery disease 100% occluded right coronary artery with distal collateralization. Medical  therapy Normal LV function Bilateral carotid bruits Sinus bradycardia Type 2 diabetes mellitus Dyslipidemia Severe COPD Anxiety disorder with history of panic attacks Remote portable as him and deep vein thrombosis  Family History: noncontributory  Social History: Retired   Review of Systems       The patient complains of chest pain and  shortness of breath.  The patient denies fatigue, malaise, fever, weight gain/loss, vision loss, decreased hearing, hoarseness, palpitations, prolonged cough, wheezing, sleep apnea, coughing up blood, abdominal pain, blood in stool, nausea, vomiting, diarrhea, heartburn, incontinence, blood in urine, muscle weakness, joint pain, leg swelling, rash, skin lesions, headache, fainting, dizziness, depression, anxiety, enlarged lymph nodes, easy bruising or bleeding, and environmental allergies.    Vital Signs:  Patient profile:   65 year old female Height:      64 inches Weight:      123 pounds BMI:     21.19 Pulse rate:   57 / minute Pulse (ortho):   55 / minute BP sitting:   125 / 73  (left arm) BP standing:   110 / 69 Cuff size:   regular  Vitals Entered By: Carlye Grippe (Apr 09, 2010 10:59 AM)  Serial Vital Signs/Assessments:  Time      Position  BP       Pulse  Resp  Temp     By 12:15 PM  Lying RA  118/69   52                    [facility], LPN 32:44 PM  Lying [place]  123/75   52                    [place], LPN 01:02 PM  Sitting   108/68   54                    [place], LPN 72:53 PM  Standing  110/69   55                    [place], LPN  Comments: 66:44 PM 2 min:  98/64  58   :  114/73  59  By: Hoover Brunette, LPN   CC: follow-up visit   Physical Exam  Additional Exam:  General: Well-developed, well-nourished in no distress head: Normocephalic and atraumatic eyes PERRLA/EOMI intact, conjunctiva and lids normal nose: No deformity or lesions mouth normal dentition, normal posterior pharynx neck: Supple, no JVD.  No masses, thyromegaly or abnormal cervical nodes lungs: Normal breath sounds bilaterally without wheezing.  Normal percussion heart: regular rate and rhythm with normal S1 and S2, no S3 or S4.  PMI is normal.  No pathological murmurs abdomen: Normal bowel sounds, abdomen is soft and nontender without masses, organomegaly or hernias noted.  No  hepatosplenomegaly musculoskeletal: Back normal, normal gait muscle strength and tone normal pulsus:unable to palpate pulses dorsalis pedis and posterior tibial bilaterally Extremities: No peripheral pitting edema neurologic: Alert and oriented x 3 skin: Intact without lesions or rashes cervical nodes: No significant adenopathy psychologic: Normal affect    EKG  Procedure date:  04/09/2010  Findings:      sinus bradycardia, right bundle branch block.  Impression & Recommendations:  Problem # 1:  PRECORDIAL PAIN (ICD-786.51) patient is known coronary artery disease. Her symptoms appear to be consistent with angina. We will order a nuclear perfusion study to make sure that the patient doesn't have any large area of ischemia.  In this case catheterization may be warranted. Her updated medication list for this problem includes:    Aspir-low 81 Mg Tbec (Aspirin) .Marland Kitchen... Take 1 tablet by mouth once a day    Metoprolol Succinate 25 Mg Xr24h-tab (Metoprolol succinate) .Marland Kitchen... Take 1/2 tablet by mouth once a day    Nitrostat 0.4 Mg Subl (Nitroglycerin) ..... Use as directed as needed chest pain  Orders: Nuclear Med (Nuc Med)  Problem # 2:  CAD (ICD-414.00) Assessment: Comment Only  Her updated medication list for this problem includes:    Aspir-low 81 Mg Tbec (Aspirin) .Marland Kitchen... Take 1 tablet by mouth once a day    Metoprolol Succinate 25 Mg Xr24h-tab (Metoprolol succinate) .Marland Kitchen... Take 1/2 tablet by mouth once a day    Nitrostat 0.4 Mg Subl (Nitroglycerin) ..... Use as directed as needed chest pain  Orders: EKG w/ Interpretation (93000)  Problem # 3:  ORTHOSTATIC DIZZINESS (ICD-780.4) orthostatic blood pressure and heart rates will be performed.  Problem # 4:  CAROTID ARTERY DISEASE (ICD-433.10) followup with Dr. Darrick Penna. Her updated medication list for this problem includes:    Aspir-low 81 Mg Tbec (Aspirin) .Marland Kitchen... Take 1 tablet by mouth once a day  Problem # 5:  PVD WITH CLAUDICATION  (ICD-443.89) details [place] outlined above  Patient Instructions: 1)  Lexiscan stress test 2)  Follow up in  6 months

## 2010-12-29 NOTE — Letter (Signed)
Summary: Lexiscan or Dobutamine Pharmacist, community at [facility]  [address]   [place], [place] [address]   Phone: [phone]  Fax: [phone]      [facility] Cardiovascular Services  Lexiscan or Dobutamine Cardiolite Strss Test    Elliot Cousin  Appointment Date:_  Appointment Time:_  Your doctor has ordered a CARDIOLITE STRESS TEST using a medication to stimulate exercise so that you will not have to walk on the treadmill to determine the condition of your heart during stress. If you take blood pressure medication, ask your doctor if you should take it the day of your test. You should not have anything to eat or drink at least 4 hours before your test is scheduled, and no caffeine, including decaffeinated tea and coffee, chocolate, and soft drinks for 24 hours before your test.  You will need to register at the Outpatient/Main Entrance at the hospital 15 minutes before your appointment time. It is a good idea to bring a copy of your order with you. They will direct you to the Diagnostic Imaging (Radiology) Department.  You will be asked to undress from the waist up and given a hospital gown to wear, so dress comfortably from the waist down for example: Sweat pants, shorts, or skirt Rubber soled lace up shoes (tennis shoes)  Plan on about three hours from registration to release from the hospital

## 2010-12-31 ENCOUNTER — Ambulatory Visit (HOSPITAL_COMMUNITY)
Admission: RE | Admit: 2010-12-31 | Discharge: 2010-12-31 | Disposition: A | Discharge status: Home / Self Care | Payer: Medicare Other | Attending: Internal Medicine | Admitting: Internal Medicine

## 2010-12-31 ENCOUNTER — Encounter: Payer: Self-pay | Admitting: Internal Medicine

## 2010-12-31 ENCOUNTER — Other Ambulatory Visit: Payer: Self-pay | Admitting: Internal Medicine

## 2010-12-31 ENCOUNTER — Encounter: Payer: Medicare Other | Admitting: Internal Medicine

## 2010-12-31 DIAGNOSIS — K5909 Other constipation: Secondary | ICD-10-CM | POA: Insufficient documentation

## 2010-12-31 DIAGNOSIS — R1031 Right lower quadrant pain: Secondary | ICD-10-CM

## 2010-12-31 DIAGNOSIS — Z7982 Long term (current) use of aspirin: Secondary | ICD-10-CM | POA: Insufficient documentation

## 2010-12-31 DIAGNOSIS — J449 Chronic obstructive pulmonary disease, unspecified: Secondary | ICD-10-CM | POA: Insufficient documentation

## 2010-12-31 DIAGNOSIS — E119 Type 2 diabetes mellitus without complications: Secondary | ICD-10-CM | POA: Insufficient documentation

## 2010-12-31 DIAGNOSIS — K5900 Constipation, unspecified: Secondary | ICD-10-CM

## 2010-12-31 DIAGNOSIS — R109 Unspecified abdominal pain: Secondary | ICD-10-CM | POA: Insufficient documentation

## 2010-12-31 DIAGNOSIS — J4489 Other specified chronic obstructive pulmonary disease: Secondary | ICD-10-CM | POA: Insufficient documentation

## 2010-12-31 HISTORY — PX: COLONOSCOPY: SHX174

## 2010-12-31 NOTE — Letter (Signed)
Summary: REFERRAL FROM DR [name]  REFERRAL FROM DR HAWKINS   Imported By: Rexene Alberts 12/17/2010 13:54:49  _____________________________________________________________________  External Attachment:    Type:   Image     Comment:   External Document

## 2010-12-31 NOTE — Letter (Signed)
Summary: [facility] ORDERS  [facility] ORDERS   Imported By: Rexene Alberts 12/16/2010 14:16:11  _____________________________________________________________________  External Attachment:    Type:   Image     Comment:   External Document

## 2010-12-31 NOTE — Letter (Signed)
Summary: [facility] ORDER  [facility] ORDER   Imported By: Ave Filter 12/16/2010 12:41:10  _____________________________________________________________________  External Attachment:    Type:   Image     Comment:   External Document
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
## 2011-01-05 ENCOUNTER — Encounter (HOSPITAL_COMMUNITY): Payer: Self-pay

## 2011-01-05 ENCOUNTER — Ambulatory Visit (HOSPITAL_COMMUNITY)
Admission: RE | Admit: 2011-01-05 | Discharge: 2011-01-05 | Disposition: A | Discharge status: Home / Self Care | Payer: Medicare Other | Source: Ambulatory Visit | Attending: Internal Medicine | Admitting: Internal Medicine

## 2011-01-05 DIAGNOSIS — K862 Cyst of pancreas: Secondary | ICD-10-CM | POA: Insufficient documentation

## 2011-01-05 DIAGNOSIS — E119 Type 2 diabetes mellitus without complications: Secondary | ICD-10-CM | POA: Insufficient documentation

## 2011-01-05 DIAGNOSIS — R1900 Intra-abdominal and pelvic swelling, mass and lump, unspecified site: Secondary | ICD-10-CM | POA: Insufficient documentation

## 2011-01-05 DIAGNOSIS — R1031 Right lower quadrant pain: Secondary | ICD-10-CM

## 2011-01-05 DIAGNOSIS — I708 Atherosclerosis of other arteries: Secondary | ICD-10-CM | POA: Insufficient documentation

## 2011-01-05 MED ORDER — IOHEXOL 300 MG/ML  SOLN
100.0000 mL | Freq: Once | INTRAMUSCULAR | Status: AC | PRN
  Administered 2011-01-05: 100 mL via INTRAVENOUS

## 2011-01-06 ENCOUNTER — Telehealth (INDEPENDENT_AMBULATORY_CARE_PROVIDER_SITE_OTHER): Payer: Self-pay

## 2011-01-06 NOTE — Letter (Signed)
Summary: RAD ORDER CT ABD/PEL W/IV&ORAL CM  RAD ORDER CT ABD/PEL W/IV&ORAL CM   Imported By: Rexene Alberts 12/31/2010 11:15:56  _____________________________________________________________________  External Attachment:    Type:   Image     Comment:   External Document

## 2011-01-06 NOTE — Miscellaneous (Signed)
Summary: Orders Update  Clinical Lists Changes  Problems: Added new problem of SCREENING FOR UNSPECIFIED CONDITION (ICD-V82.9) Orders: Added new Test order of T-Creatinine Blood ([id number]) - Signed 

## 2011-01-14 NOTE — Progress Notes (Addendum)
Summary: phone note/ requests for CT results  Phone Note Call from Patient   Caller: Patient Summary of Call: Pt called and is very anxious to hear from her CT. Said she is so tired of not being able to eat anything but jello. Please advise! Initial call taken by: Cloria Spring LPN,  January 06, 2011 9:37 AM     Appended Document: phone note/ requests for CT results spoke to pt. Very anxious. today is [birth year] birthday. very talkative, difficult to give recommendations as she was focused on constipation. Discussed options to include miralax. Does not want to do miralax, states it is difficult to tolerate. dulcolax worked in past. told her she may do that, enema as needed, will have Dr. [name] review CT for further rec's to come. as of note no large vessel occlusion, but pt is at risk for mesenteric ischemia. no acute findings per radiologist. large stool load. Pt instructed to seek medical attention if severe abd pain, N/V. Further rec's to follow.   Appended Document: phone note/ requests for CT results Nodthing to explain abdominal pain on Ct??constipation, adhesions....doubt ischemia at this time. ned more effective treatment for constipation. Suggest trial of Amitiza ; begin 8 micrograms dialy during BF x 5 days; then increase to two times a day therafter; warn about SE of diarrhea, nausea and ha Rx #60 w 3 refills.  Appended Document: phone note/ requests for CT results Informed pt of the above. Rx called to [facility].   Appended Document: phone note/ requests for CT results spoke to pt extensively. pt called stating amitiza made her sick, despite taking with food. +indigestion, severe nausea. Has resolved since stopping. very upset she had to pay 59$ for prescription. I reviewed multiple options of agents for constipation. She refuses miralax, and actually refuses anything liquid as she states she can't handle it. Only will do pills. Dulcolax has worked in past. Informed to take  dulcolax. Will add probiotic. Pt offered appt, states unable to come because feeling weak. Will leave probiotic at front desk. Pt to call back when ready for appt.

## 2011-01-19 NOTE — Op Note (Signed)
  NAME:  Melissa Holland, MULLAN NO.:  [id number]  MEDICAL RECORD NO.:  [id number]           PATIENT TYPE:  O  LOCATION:  DAY                           FACILITY:  [facility]  PHYSICIAN:  R. Roetta Sessions, M.D. DATE OF BIRTH:  [birth year]  DATE OF PROCEDURE:  12/31/2010 DATE OF DISCHARGE:                              OPERATIVE REPORT   INDICATIONS FOR PROCEDURE:  A 65 year old lady with chronic constipation of nonspecific migratory, abdominal pain worse since reported mesh placed for a pelvic procedure previously.  It has been well over 10 years that she had a colonoscopy.  Recent plain films suggested increased stool of colon consistent with constipation.  Colonoscopy is now being done.  Risks, benefits, limitations, imponderables and alternatives have been discussed because of polypharmacy difficulty with sedation previously she is being done in the OR under propofol.  PROCEDURE NOTE:  I placed her on left lateral decubitus position. Monitoring and propofol per anesthesia staff.  INSTRUMENT:  Pentax video chip system.  FINDINGS:  Digital rectal exam revealed no abnormalities.  Endoscopic finding:  Prep was suboptimal, quite a bit of liquid stool with greasy colonic effluent in the right colon and otherwise the prep was acceptable at the colon.  Colon:  Colonic mucosa was surveyed from the rectosigmoid junction through the left transverse right colon to the appendiceal orifice, ileocecal valve/cecum.  These structures were seen and photographed for the record.  From this level, the scope was slowly and cautiously withdrawn.  All previously mentioned mucosal surfaces were again seen.  The colonic mucosa appeared normal. The colon was elongated and tortuous requiring external abdominal pressure to reach the cecum.  The colonic effluent was greasy on the right side which clouded the scope lens was difficult to wash off as did compromise the exam.  No colonic mucosal  abnormalities were observed.  The scope was pulled down into the rectum, where a thorough examination of the rectal mucosa including retroflexed view of the anal verge demonstrated no abnormalities.  The patient tolerated the procedure well, was taken to PACU in stable condition.  Cecal withdrawal time 10 minutes.  IMPRESSION: 1. Normal rectum. 2. Long tortuous, but otherwise normal-appearing colon.  Suboptimal     prep and exam were difficult.  RECOMMENDATIONS: 1. Abdominal pain, nonspecific maybe related to constipation.  We will     go ahead and do a CT of the abdomen and pelvis.  It may be that her     symptoms are also related to constipation.  A component of     postsurgical pain is not excluded at this time. 2. A contrast CT of the abdomen pelvis in the near future. 3. Increase MiraLax 17 g orally daily p.r.n.  No bowel movement on any     given day. 4. Continue Colace 100 mg orally twice daily. 5. Further recommendations to follow.     Jonathon Bellows, M.D.     RMR/MEDQ  D:  12/31/2010  T:  12/31/2010  Job:  [id number]  cc:   Ramon Dredge L. [name], M.D. Fax: [phone]  Electronically Signed by Lorrin Goodell M.D. on 01/19/2011 02:32:00 PM

## 2011-02-12 ENCOUNTER — Encounter: Payer: Self-pay | Admitting: Internal Medicine

## 2011-02-16 NOTE — Letter (Signed)
Summary: MEDICAL RECORD REQ  MEDICAL RECORD REQ   Imported By: Rexene Alberts 02/12/2011 11:29:44  _____________________________________________________________________  External Attachment:    Type:   Image     Comment:   External Document

## 2011-03-05 LAB — POCT I-STAT, CHEM 8
Creatinine, Ser: 0.8 mg/dL (ref 0.4–1.2)
Glucose, Bld: 151 mg/dL — ABNORMAL HIGH (ref 70–99)
HCT: 42 % (ref 36.0–46.0)
Hemoglobin: 14.3 g/dL (ref 12.0–15.0)
Potassium: 4.3 mEq/L (ref 3.5–5.1)
Sodium: 134 mEq/L — ABNORMAL LOW (ref 135–145)
TCO2: 29 mmol/L (ref 0–100)

## 2011-04-08 ENCOUNTER — Other Ambulatory Visit (INDEPENDENT_AMBULATORY_CARE_PROVIDER_SITE_OTHER): Payer: Medicare Other

## 2011-04-08 ENCOUNTER — Ambulatory Visit (INDEPENDENT_AMBULATORY_CARE_PROVIDER_SITE_OTHER): Payer: Medicare Other | Admitting: Vascular Surgery

## 2011-04-08 ENCOUNTER — Encounter (INDEPENDENT_AMBULATORY_CARE_PROVIDER_SITE_OTHER): Payer: Medicare Other

## 2011-04-08 DIAGNOSIS — I6529 Occlusion and stenosis of unspecified carotid artery: Secondary | ICD-10-CM

## 2011-04-08 DIAGNOSIS — I70219 Atherosclerosis of native arteries of extremities with intermittent claudication, unspecified extremity: Secondary | ICD-10-CM

## 2011-04-08 DIAGNOSIS — I739 Peripheral vascular disease, unspecified: Secondary | ICD-10-CM

## 2011-04-09 NOTE — Assessment & Plan Note (Signed)
OFFICE VISIT  Melissa Holland, Melissa Holland DOB:  1946/11/16                                       04/08/2011 [id number]#:[id number]  CHIEF COMPLAINT:  Neck pain, leg pain and constipation.  HISTORY OF PRESENT ILLNESS:  The patient is a 65 year old female that we had previously followed for peripheral arterial disease consisting primarily of moderate carotid stenosis and claudication symptoms in her right leg.  She was last seen in October of 2010.  She presents today with multiple complaints related to multiple vascular beds.  PROBLEM #1:  The patient complains of intermittent pain and swelling in her left neck.  She had some type of excisional biopsy in her left neck previously by Dr. [name] with ENT.  She states that intermittently she has pain and swelling in her left neck.  She denies any fever or chills. She apparently was seen by Dr. [name] recently and did not really know if any further followup had been scheduled.  She denies any symptoms of TIA, amaurosis or stroke.  She is currently on aspirin 81 mg once a day. She did not know if her previous operation in her left neck had been for cancer.  PROBLEM #2:  She has a history of intermittent claudication in her right lower extremity.  Today she complains of pain in both legs.  She states that the pain is worse in the left leg and she begins to experience some cramping and tight sensation in her calf after walking approximately 1/2 block.  She denies rest pain.  She denies any nonhealing wounds on the foot.  Previous arteriogram had shown a right popliteal artery occlusion with some distal tibial disease.  PROBLEM #3:  The patient has had intermittent episodes of chronic constipation and has been under evaluation for this.  She recently had a colonoscopy by Dr. [name] and has been prescribed several medications to try to improve her constipation symptoms.  Colonoscopy was fairly unremarkable except for being long  and tortuous.  She subsequently had a CT scan of the abdomen and pelvis which showed extensive atherosclerosis and calcification of the abdominal aorta with a large amount of stool throughout the colon.  There was also some calcification near the mesenteric vessels.  The patient denies postprandial abdominal pain.  However, she states she does get some bloating and frequent symptoms of constipation.  She denies food fear.  She denies weight loss.  CHRONIC MEDICAL PROBLEMS:  Include diabetes, COPD both of which are currently controlled and followed by Dr. [name].  She also continues to smoke approximately 1 pack of cigarettes per day.  PAST SURGICAL HISTORY:  Vein stripping procedure, tonsillectomy, left neck operation as mentioned above, hysterectomy, appendectomy.  SOCIAL HISTORY:  She is widowed.  She has 2 children, 1 is deceased. Smoking history is as listed above.  She does not consume alcohol regularly.  FAMILY HISTORY:  She has no history of vascular disease at age less than 16.  REVIEW OF SYSTEMS:  Full 12 point review of systems was performed with the patient today.  She is 5 feet 5 inches, 118 pounds. VASCULAR:  Is as listed above. CARDIAC:  She occasionally has some chest tightness and pressure. GI:  Constipation as mentioned above. NEUROLOGIC:  She has occasional headaches. PULMONARY:  She has a chronic productive cough. URINARY:  She has some frequency. PSYCHIATRIC:  She  has mild anxiety and depression. MUSCULOSKELETAL:  She has multiple joint and arthritis pain. ENT and hematologic review of systems were negative.  Skin was negative.  PHYSICAL EXAM:  Vital signs:  Blood pressure 110/63 in the left arm, heart 65 and regular, respirations 16.  HEENT:  Unremarkable.  Neck: Has 2+ carotid pulses with a left-sided carotid bruit.  She has some pain on palpation over the left carotid artery but there does not seem to be any lymph node palpable in this area.  She has a  well-healed submandibular area of scar.  The carotid pulse is quite prominent bilaterally.  Chest:  Clear to auscultation.  Cardiac:  Regular rate and rhythm without murmur.  Abdomen:  Soft, nontender, nondistended.  No bruits.  No masses.  Musculoskeletal:  She has no major joint deformities.  No significant edema.  Neurological:  She has symmetric upper extremity and lower extremity motor strength which is 5/5.  The patient has pressured speech and tends to drift from idea to idea and is difficult to keep on task as far as the interview is concerned.  Skin: She has no open ulcers or rashes.  Peripheral vascular:  She has 2+ femoral with absent popliteal and pedal pulses bilaterally.  Feet are pink, warm and well-perfused.  MEDICATIONS: 1. Metformin 500 mg twice a day. 2. Aspirin 81 mg once a day. 3. Centrum. 4. Hydrocodone. 5. Alprazolam. 6. Albuterol inhaler. 7. Albuterol nebulizer. 8. Simvastatin. 9. Metoprolol. 10.NitroQuick.  ALLERGIES:  She has an allergy listed to contrast which is actually an interaction with her metformin.  She also has allergy listed to penicillin and sulfa which she states makes it that she cannot breathe and Alupent causes her to hyperventilate.  She had bilateral ABIs performed today which I reviewed and interpreted. ABI on the left side was 0.95, on the right was 0.49.  These are similar to her previous ABIs and essentially unchanged from April of 2011.  She also had a bilateral carotid duplex exam which I have reviewed and interpreted.  It shows a 40%-60% right internal carotid artery stenosis, a 40%-60% left internal carotid artery stenosis which again is really no significant change from April of 2011.  I also reviewed the images from her CT scan of the abdomen and pelvis. This does show some calcification of the abdominal aorta.  However, this was not a dedicated study to look at the mesenteric vessels.  There is some calcification near the  mesenteric vessels but a discrete stenosis cannot be well-defined.  In summary, as far as her peripheral arterial disease and claudication symptoms are concerned I believe her symptoms are stable.  Her ABIs are also stable.  I believe the best option for this is continued conservative management with walking and again emphasized smoking cessation.  As far as her carotid disease is concerned she still has a moderate carotid stenosis.  I do not believe this explains the pain and swelling she occasionally has in her left neck.  This was not present today. Again, I believe continued surveillance with a repeat carotid duplex ultrasound in 6 months and continued antiplatelet therapy with aspirin is the best management.  Finally as far as her chronic constipation symptoms are concerned I do not believe this is necessarily related to mesenteric occlusive disease. However, certainly the patient is at risk from this and does have some anatomic findings that could be suggestive of this.  However, her symptomatology is not really classic for mesenteric ischemia.  I  believe the best option for this would be continued observation.  If her symptoms become progressively worse over time we could obtain a dedicated CT angiogram of the abdomen and pelvis.  Otherwise I have scheduled her for a mesenteric duplex ultrasound in 6 months' time.    Janetta Hora. Kota Ciancio, MD Electronically Signed  CEF/MEDQ  D:  04/09/2011  T:  04/09/2011  Job:  [id number]  cc:   Ramon Dredge L. [name], M.D. Jonathon Bellows, M.D.

## 2011-04-13 NOTE — Assessment & Plan Note (Signed)
OFFICE VISIT   Melissa Holland, Melissa Holland  DOB:  13-Jan-1946                                       08/06/2009  ZOXWR#:[id number]   The patient is a 65 year old female referred by Dr. [name] for  evaluation of lower extremity claudication.  She states that currently  after walking approximately 10 yards she develops tightness in her calf,  right leg greater than left.  She also states that she has pain in her  right foot that wakes her from sleep occasionally.  Her atherosclerotic  risk factors include diabetes, elevated cholesterol and coronary artery  disease.  She also is a current smoker of half a pack of cigarettes per  day.  She has tried to quit on several prior occasions but has been  unsuccessful.  We discussed for greater than 3 minutes today smoking  cessation techniques and the possibility of her quitting in the future.   As far as her coronary artery disease is concerned she still gets chest  pain approximately one to two times per week.  She last saw Dr. Andee Lineman  18 months ago.   She has no history of open ulcerations or nonhealing wounds on the foot.   PAST SURGICAL HISTORY:  She had a left neck mass removed by Dr. [name].  She has had a left leg vein stripping, tonsillectomy, hysterectomy,  appendectomy.   MEDICATIONS:  1. Metformin 500 mg 2 tablets twice a day.  2. Aspirin 81 mg once a day.  3. Centrum Silver once a day.  4. Hydrocodone 7.5/750 four times a day.  5. Alprazolam 4 mg 4 times a day.  6. Albuterol inhaler 2 puffs 4 times a day p.r.n.  7. Albuterol nebulizer 4 times a day p.r.n.  8. Simvastatin 40 mg once in the evening.  9. Metoprolol 25 mg half tablet in the morning.  10.NitroQuick 0.4 mg p.r.n. chest pain.   FAMILY HISTORY:  Unremarkable.   SOCIAL HISTORY:  She is widowed, has one living child.  Smoking history  is as above.  She does not consume alcohol regularly.   REVIEW OF SYSTEMS:  She is 5 feet 5 inches, 120 pounds.   She has had  some weight loss recently.  CARDIAC:  She has occasional chest pain as mentioned above.  PULMONARY:  She has a productive cough with chronic bronchitis, asthma,  wheezing.  GI:  She has a history of umbilical hernia and recurrent constipation.  RENAL:  She has urinary frequency.  NEURO:  She has occasional headaches and dizziness.  ORTHOPEDIC:  She has multiple joint, arthritis and muscle pain.  PSYCHIATRIC:  She has mild depression, anxiety.  ENT and hematologic systems are negative.   ALLERGIES:  Penicillin causes her to have trouble breathing.  Sulfa,  trouble breathing.  Alupent hyperventilation.   PHYSICAL EXAM:  Vital signs:  Heart rate is 80.  HEENT:  Unremarkable.  Neck:  Has 2+ carotid pulses with a left-sided carotid bruit.  Chest:  Clear to auscultation.  Cardiac:  Regular rate and rhythm without  murmur.  Abdomen:  Is soft, nontender, nondistended.  No masses.  Extremities:  She has 2+ radial, 2+ femoral pulses bilaterally.  She has  no popliteal or pedal pulses.  Feet are pink, warm and reasonably well-  perfused although there is some scaling and dryness of the skin  bilaterally.  There are no open ulcerations.  There is no edema.  Neurological:  Shows symmetric upper extremity and lower extremity,  motor strength is 5/5 bilaterally.   She had bilateral ABIs performed at [facility] on August 10  showed an ABI on the right side of 0.44 and on the left of 0.89.   We also performed a carotid duplex scan on her today because of her left  carotid bruit.  This showed a 60-80% left internal carotid artery  stenosis with a 40-60% right internal carotid artery stenosis.   In summary, the patient has bilateral lower extremity claudication,  right greater than left.  Her ABIs on the right side have decreased  significantly and I believe that some of the pain in her right foot is  probably rest pain in origin.  I believe that she needs some sort of   revascularization in the right leg to improve circulation to prevent  limb loss.  We have scheduled her for an aortogram and bilateral lower  extremity runoff on Friday 08/15/2009.  We will consider angioplasty and  stenting of her right leg at that time.  If she is not a percutaneous  candidate and requires open revascularization instead then we would need  to have her revisit Dr. Andee Lineman preoperatively for cardiac risk  stratification.  As far as her carotid disease is concerned she will  need a repeat carotid duplex exam in 6 months to make sure that she has  not progressed greater than 80% since currently her stenosis is  asymptomatic.   Janetta Hora. Fields, MD  Electronically Signed   CEF/MEDQ  D:  08/06/2009  T:  08/07/2009  Job:  [id number]   cc:   Ramon Dredge L. [name], M.D.  Learta Codding, MD,FACC

## 2011-04-13 NOTE — Procedures (Signed)
CAROTID DUPLEX EXAM   INDICATION:  Carotid bruit.   HISTORY:  Diabetes:  Yes.  Cardiac:  Yes.  Hypertension:  No.  Smoking:  Yes.  Previous Surgery:  No carotid surgery.  CV History:  Amaurosis Fugax No, Paresthesias No, Hemiparesis No                                       RIGHT             LEFT  Brachial systolic pressure:         140               138  Brachial Doppler waveforms:         WNL               WNL  Vertebral direction of flow:        Antegrade         Antegrade  DUPLEX VELOCITIES (cm/sec)  CCA peak systolic                   88                114  ECA peak systolic                   54                84  ICA peak systolic                   196               297  ICA end diastolic                   54                87  PLAQUE MORPHOLOGY:                  Mixed             Mixed  PLAQUE AMOUNT:                      Moderate          Moderate / severe  PLAQUE LOCATION:                    ICA/CCA           ICA/CCA   IMPRESSION:  1. Right internal carotid artery shows evidence of 40-59% stenosis      (top end of range).  2. Left internal carotid artery shows evidence of 60-79% stenosis.  3. Bilateral bifurcations appear high L>R.   ___________________________________________  Janetta Hora. Fields, MD   AS/MEDQ  D:  08/06/2009  T:  08/06/2009  Job:  [id number]

## 2011-04-13 NOTE — Assessment & Plan Note (Signed)
OFFICE VISIT   MARGA, GRAMAJO  DOB:  1946-10-25                                       09/10/2009  EAVWU#:[id number]   The patient is a 65 year old female who underwent aortogram with  bilateral lower extremity runoff on 08/22/2009.  This was done for  primarily claudication symptoms in the right leg.  Unfortunately her  arterial tree is such that a percutaneous revascularization was not an  option.  She has primarily an occlusion of the right popliteal artery  with some distal tibial disease.  She would be a candidate for right  femoral to below knee popliteal bypass.  However, for claudication  symptoms alone I would not consider this for now.  If she develops rest  pain or nonhealing ulcerations we could consider this in the future.  I  believe the best option for now would be smoking cessation with a  walking program and overall risk factor modification.   On exam today the patient was seen in the office.  She has no  significant changes in her feet.  She has no open ulcerations on the  feet.  The feet are pink, warm and reasonably well-perfused.  All these  findings were discussed with the patient.  Smoking cessation techniques  were also discussed with the patient at length today greater than 3  minutes.  She will follow with me in six months' time for repeat ABIs.  If she develops rest pain or nonhealing ulcer before then she will  return for followup sooner.   Janetta Hora. Fields, MD  Electronically Signed   CEF/MEDQ  D:  09/10/2009  T:  09/11/2009  Job:  [id number]   cc:   Ramon Dredge L. [name], M.D.  Learta Codding, MD,FACC

## 2011-04-13 NOTE — Op Note (Signed)
NAMEAUDREE, SCHRECENGOST NO.:  [id number]   MEDICAL RECORD NO.:  [id number]          PATIENT TYPE:  OIB   LOCATION:  5125                         FACILITY:  [facility]   PHYSICIAN:  Suzanna Obey, M.D.       DATE OF BIRTH:  [birth year]   DATE OF PROCEDURE:  05/16/2008  DATE OF DISCHARGE:                               OPERATIVE REPORT   PREOPERATIVE DIAGNOSIS:  Left parotid mass.   POSTOPERATIVE DIAGNOSIS:  Left parotid mass.   SURGICAL PROCEDURES:  Left parotidectomy.   ANESTHESIA:  General.   ESTIMATED BLOOD LOSS:  Less than 20 mL.   SURGEON:  Suzanna Obey, MD.   ASSISTANTZola Button T. [facility], MD.   This is a 65 year old has had a mass in the left parotid region in the  tail that has continued to swell and cause her what she perceives as  pain in the area from this mass.  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  She was informed  of the risks and benefits of the procedure and the procedure was  discussed.  Options were discussed.  All her questions were answered and  consent was obtained.   OPERATION:  The patient was taken to the operating room and placed in  supine position after adequate general endotracheal tube anesthesia.  The facial nerve monitor was positioned and calibrated with good  impedance.  The patient was prepped and draped in the usual sterile  manner.  The Blair incision was performed of the from the ear dissecting  around the lobule then into the neck and the anterior flap was dissected  with the electrocautery and then the earlobe was then dissected with a  flap posteriorly.  The dissection was carried along the cartilage of the  ear down to the tympanomastoid suture line and the tragal pointer where  the facial nerve was easily identified.  The trunk of the nerve was  dissected, dissecting up to the pes with the [facility]  The  parotid tissue was taken off laterally and once the  inferior branch was  completely dissected, the mass was dissected off of the  sternocleidomastoid muscle and there was some motor oil color material  coming from the mass which probably was consistent with a Wharton's  tumor.  This was removed nicely with a good margin visually.  The wound  was copiously irrigated with saline.  The facial nerve had been  dissected with intact branches.  The wound was then  closed with interrupted 4-0 chromic.  A #7 JP drain was placed and  secured a 3-0 nylon.  The skin was closed with a running 5-0 nylon.  The  patient was then awakened and brought to recovery in stable condition.  Counts were correct.           ______________________________  Suzanna Obey, M.D.     JB/MEDQ  D:  05/16/2008  T:  05/17/2008  Job:  [id number]   cc:   Ramon Dredge L. [name], M.D.

## 2011-04-13 NOTE — Procedures (Signed)
CAROTID DUPLEX EXAM   INDICATION:  Follow up carotid artery disease.   HISTORY:  Diabetes:  Yes.  Cardiac:  Yes.  Hypertension:  No.  Smoking:  Yes.  Previous Surgery:  No carotid surgery.  CV History:  Asymptomatic.  Amaurosis Fugax No, Paresthesias No, Hemiparesis No.                                       RIGHT             LEFT  Brachial systolic pressure:         128               122  Brachial Doppler waveforms:         WNL               WNL  Vertebral direction of flow:        Antegrade         Antegrade  DUPLEX VELOCITIES (cm/sec)  CCA peak systolic                   97                104  ECA peak systolic                   87                95  ICA peak systolic                   192               311  ICA end diastolic                   52                81  PLAQUE MORPHOLOGY:                  Mixed             Mixed  PLAQUE AMOUNT:                      Moderate          Moderate  PLAQUE LOCATION:                    ICA/CCA           ICA/CCA   IMPRESSION:  1. Right internal carotid artery shows evidence of 40% to 59% stenosis      (top end of range).  2. Left internal carotid artery shows evidence of 60% to 79% stenosis.  3. No significant changes from previous study.   ___________________________________________  Janetta Hora Fields, MD   AS/MEDQ  D:  03/25/2010  T:  03/25/2010  Job:  [id number]

## 2011-04-16 NOTE — Cardiovascular Report (Signed)
[name], Melissa Holland NO.:  [id number]   MEDICAL RECORD NO.:  [id number]          PATIENT TYPE:  INP   LOCATION:  2030                         FACILITY:  [facility]   PHYSICIAN:  Rollene Rotunda, MD, FACCDATE OF BIRTH:  [birth year]   DATE OF PROCEDURE:  03/27/2007  DATE OF DISCHARGE:                            CARDIAC CATHETERIZATION   PRIMARY CARE PHYSICIAN:  Dr. Shaune Pollack.   CARDIOLOGIST:  Dr. Peyton Bottoms.   PROCEDURE:  Left heart catheterization performed to the right femoral  artery.  The artery is cannulated using an anterior wall puncture.  A #6  French arterial sheath was inserted via the modified Seldinger  technique.  Preformed Judkins and a pigtail catheter were utilized.  The  patient tolerated the procedure well and left the lab in stable  condition.   RESULTS:   HEMODYNAMICS:  1. LV 107/9, AO 108/72.  2. Coronaries.  Left main had an ostial calcified 30% stenosis.  The      LAD had long proximal calcification.  There was long proximal 30%      stenosis.  There was mid tandem 30% stenosis.  The vessel wrapped      the apex.  The first diagonal was tiny and normal.  The second      diagonal was small and normal.  The circumflex had proximal      calcification.  There was long proximal 25% stenosis.  There was      mid calcified 25% stenosis.  There was mid 50-60% stenosis before      an obtuse marginal.  Mid obtuse marginal was origin branching.      There was mid 25% stenosis.  The right coronary artery was occluded      in the mid segment.  There were extensive left-to-right      collaterals.   LEFT VENTRICULOGRAM:  The left ventriculogram was obtained in the RAO  projection.  The EF was 60% with normal wall motion.   CONCLUSION:  Severe single-vessel coronary artery disease with excellent  collateral flow.  Nonobstructive extensive disease elsewhere with heavy  calcification.  Well-preserved ejection fraction.   PLAN:  The patient needs  extensive secondary risk reduction.  She is  high risk for future cardiovascular events if she does not stop smoking.  She needs a statin.  She needs good control of her diabetes.  I will  discuss this with Dr. [name].     Rollene Rotunda, MD, [facility]  Electronically Signed    JH/MEDQ  D:  03/27/2007  T:  03/27/2007  Job:  [id number]   cc:   Ramon Dredge L. [name], M.D.

## 2011-04-16 NOTE — Assessment & Plan Note (Signed)
[facility]                                 ON-CALL NOTE   JINA, OLENICK                  MRN:          [id number]  DATE:05/15/2007                            DOB:          February 04, 1946    PRIMARY CARDIOLOGIST:  Dr. Andee Lineman.   PRIMARY CARE PHYSICIAN:  Dr. Shaune Pollack.   Ms. Ledger called this evening because of complaints of shortness of  breath, coughing up a whitish sputum, and volume overload. Her stated  her weight was up 9 pounds and her feet and ankles were swollen. She  stated she was having more wheezing than usual and felt that she was  volume overloaded. She stated that she drinks a lot of water on a  regular basis to try and keep her blood sugars under control. She was  not having any chest pain or other symptoms.   I advised Ms. Lansky that she really should be seen in the emergency  room but she categorically refused to do this. She stated that under no  circumstances would she go to the emergency room. I then advised her  that I would call her in a fluid pill to the pharmacy. I also advised  her that she needed to been seen in our office in the next day or so. Of  note Ms. Mozingo stated that she had called the office 3 times today, and  each time was told someone would get back to her, but never heard from  anyone in our office. I advised Ms. Talley that I would leave a message  with our office so that she should be seen in the [place] office within the  next 24 hours, and they should call her and advise her of her  appointment (which I have done). I then called in a prescription for  Lasix 20 mg a day to her pharmacy. I also advised her to cut the Toprol  XL daily in half, until she is evaluated and we can figure out if this  is contributing to the volume overload. I advised the patient that if  her condition worsened she would have to call 911 and come to the  emergency room, but she felt that if she could take a medication to  help  her get rid of the fluid then that would be all she would need. She  stated she had been on the fluid pill in the past but not in several  years. I reiterated to Ms. Derossett that if her symptoms worsen she should  come to the emergency room but she once again stated that this was not  something she was willing to consider.      Theodore Demark, PA-C  Electronically Signed      Rollene Rotunda, MD, [facility]  Electronically Signed   RB/MedQ  DD: 05/15/2007  DT: 05/16/2007  Job #: [id number]

## 2011-04-16 NOTE — Assessment & Plan Note (Signed)
[facility]                          [facility] OFFICE NOTE   Melissa Holland, Melissa Holland                  MRN:          [id number]  DATE:04/03/2007                            DOB:          Jan 19, 1946    PRIMARY CARDIOLOGIST:  Learta Codding, MD,FACC ([place]).   REASON FOR OFFICE VISIT:  Post hospital followup.   Ms.  Holland is a 65 year old female, with no prior history of coronary  artery disease, who was recently transferred directly from [facility] ER to [facility] for further evaluation of chest pain. She rule  out for myocardial infarction and underwent cardiac catheterization  revealing severe single-vessel CAD with 100% occlusion of the RCA with  distal collateralization.  Residual anatomy notable for 60% mid  circumflex disease but otherwise nonobstructive CAD.  Left ventricular  function was normal with no wall motion abnormalities.   Aggressive risk factor modification was recommended, and the patient was  discharged with the addition of beta blocker and simvastatin.   The patient reports no exertional angina pectoris.  She states that the  precipitating episode was new and awoke her from sleep. She does,  however, complain of occasional weakness but with no associated  presyncope or syncope.  She notes no complications with the right groin  incision site.   Electrocardiogram today reveals sinus bradycardia at 51 beats per minute  with normal axis and no ischemic changes.   CURRENT MEDICATIONS:  1. Aspirin 81 daily.  2. Metoprolol 25 b.i.d.  3. Simvastatin 40 daily.  4. Nebulizer treatments 4 times a day.  5. Albuterol MDI 4 times a day and p.r.n.  6. Alprazolam 1 mg 4 times a day.  7. Hydrocodone 7.5/750 4 times a day.  8. Metformin 500 b.i.d.   PHYSICAL EXAMINATION:  VITAL SIGNS: Blood pressure 114/67, pulse 55 and  regular, weight 118.6.  GENERAL:  A 65 year old female, sitting upright, no distress.  HEENT:   Normocephalic and atraumatic.  NECK:  Palpable bilateral carotid pulses with faint bilateral carotid  bruits.  LUNGS: Diminished breath sounds in the bases but no crackles or wheezes.  HEART:  Regular rate and rhythm (S1, S2).  No significant murmurs.  ABDOMEN:  Soft, nontender, with intact bowel sounds; no palpable  pulsatile mass or epigastric bruit.  Right groin stable with no  hematoma, a small knot but no bruit on auscultation; intact femoral  pulse.  EXTREMITIES:  Diminished peripheral pulses with trace edema.  NEUROLOGIC:  No focal deficit.   IMPRESSION:  1. Severe single-vessel coronary artery disease.      a.     100% occluded right coronary artery with distal       collateralization.  Medical therapy recommended.      b.     Normal left ventricular function.  2. Bilateral carotid bruits.  3. Sinus bradycardia.  4. Type 2 diabetes mellitus.  5. Dyslipidemia.  Total cholesterol 186, triglycerides 212, HDL 82,      LDL 62 by recent profile.  6. Severe chronic obstructive pulmonary disease.  7. 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  8. Remote pulmonary  embolus and deep vein thrombosis.   PLAN:  1. Down titrate beta blocker to Toprol XL 25 daily, particularly in      light of the patient's sinus bradycardia, absence of myocardial      infarction or left ventricular dysfunction, reported recent      fatigue, and severe underlying chronic obstructive pulmonary      disease.  2. Schedule carotid Dopplers for further assessment of bilateral      bruits.  3. Recommended fasting lipids and liver profile in approximately 2      months.  4. Schedule return clinic followup with myself in Dr. Andee Holland in      approximately 3 months.      Gene Serpe, PA-C       Learta Codding, MD,FACC    GS/MedQ  DD: 04/03/2007  DT: 04/03/2007  Job #: [id number]   cc:   Ramon Dredge L. [name], M.D.

## 2011-04-16 NOTE — Discharge Summary (Signed)
NAMEISHANA, BLADES NO.:  [id number]   MEDICAL RECORD NO.:  [id number]          PATIENT TYPE:  INP   LOCATION:  2030                         FACILITY:  [facility]   PHYSICIAN:  Rollene Rotunda, MD, FACCDATE OF BIRTH:  Nov 13, 1946   DATE OF ADMISSION:  03/25/2007  DATE OF DISCHARGE:  03/27/2007                               DISCHARGE SUMMARY   PROCEDURES:  1. Cardiac catheterization.  2. Coronary arteriogram.  3. Left ventriculogram.   TIME AT DISCHARGE:  44 minutes.   PRIMARY DIAGNOSIS:  Acute coronary syndrome.   SECONDARY DIAGNOSES:  1. Diabetes  2. Chronic obstructive pulmonary disease.  3. History of transient ischemic attacks.  4. Family history of coronary artery disease.  5. Five beats of nonsustained ventricular tachycardia.  6. History of panic attacks.  7. Arthritis.  8. Status post tonsillectomy, adenoidectomy, hysterectomy,      appendectomy, cyst removal and vein stripping.  9. Hyponatremia.  10.History of deep vein thrombosis and pulmonary embolus in the 1970s.  11.Hypoglycemia with a blood sugar 49.  12.Allergy or intolerance to PREDNISONE, BENADRYL, SULFA, and      PENICILLIN.  13.Claustrophobia.   HOSPITAL COURSE:  Ms. Fister is a 65 year old female with no previous  history of coronary artery disease.  She was awakened by chest pain and  took Xanax and a pain pill.  It returned and lasted approximately 20  minutes.  She went to [facility] where chest pain did not resolve  and was transferred to [facility] for further evaluation and  treatment.   Her cardiac enzymes showed an elevated CK-MB with a peak of 176.7.4  The  index was elevated at 4.2.  However, her troponins were all within  normal limits between 0.01 and 0.02.  Her sodium was initially low at  131. Hemoglobin A1c was elevated at 7.5.  Her total cholesterol was 186,  triglycerides 212, HDL 82, LDL 62.   After evaluating her for multiple coronary risk  factors, it was decided  that cardiac catheterization was the best option.  This was performed on  March 27, 2007.   The cardiac catheterization showed the left main to have an ostial  calcified lesion of 30%, LAD with a proximal calcified 30% lesion.  Two  diagonals were normal, and the circumflex had a proximal calcified  lesion of 25% and a mid lesion of 50-60% before the OM.  The middle  obtuse marginal was a large branching vessel with the 25% stenosis in  the mid portion.  The RCA was totaled in the mid portion with left-to-  right collaterals.  Her EF was 60% with normal wall motion.   Dr. Antoine Poche evaluated the films and felt that she needed medical  management of coronary artery disease and aggressive secondary risk  factor reduction.  She was started on beta blocker and a statin,  and  she is to have a prescription for sublingual nitroglycerin as well.   Post catheterization, Ms. Canino was without chest pain or shortness of  breath.  If her groin was stable with ambulation, she will be discharged  today with outpatient  followup arranged.   DISCHARGE INSTRUCTIONS:  1. Her activity level is to be increased gradually.  Of note, she is      supposed to go to court as a witness this week and requested a note      stating that she was not able to walk up an entire flight of steps      at this time which she was given.  2. She is to stick to a low-sodium diabetic diet.  3. She is not to use tobacco.  4. She is to call our office for any problems with the catheterization      site.  5. She is to follow up with Dr. Andee Lineman in [place] on Monday, May 5 at      1:15 and with Dr. Shaune Pollack in [place] as needed.   DISCHARGE MEDICATIONS:  1. Aspirin 81 mg daily  2. Metformin 500 mg b.i.d., hold 48 hours post catheterization.  3. Albuterol as prior to admission.  4. Hydrocodone as prior to admission.  5. Xanax as prior to admission.  6. Vitamins as prior to admission.  7.  Nitroglycerin sublingual p.r.n.  8. Metoprolol 25 mg b.i.d.  9. Simvastatin 40 mg daily p.m.      Theodore Demark, PA-C      Rollene Rotunda, MD, [facility]  Electronically Signed    RB/MEDQ  D:  03/27/2007  T:  03/27/2007  Job:  [id number]   cc:   Ramon Dredge L. [name], M.D.  [facility], [place], [place]
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
## 2011-04-17 NOTE — Procedures (Unsigned)
CAROTID DUPLEX EXAM  INDICATION:  Followup carotid artery disease.  HISTORY: Diabetes:  Yes. Cardiac:  Yes. Hypertension:  No. Smoking:  Yes. Previous Surgery:  No. CV History:  Currently asymptomatic. Amaurosis Fugax No, Paresthesias No, Hemiparesis No                                      RIGHT             LEFT Brachial systolic pressure:         150               148 Brachial Doppler waveforms:         Normal            Normal Vertebral direction of flow:        Antegrade         Antegrade DUPLEX VELOCITIES (cm/sec) CCA peak systolic                   79                81 ECA peak systolic                   50                57 ICA peak systolic                   166               174 ICA end diastolic                   50                61 PLAQUE MORPHOLOGY:                  Calcific          Calcific PLAQUE AMOUNT:                      Minimal/moderate  Moderate PLAQUE LOCATION:                    CCA, ICA, ECA     CCA, ICA, ECA  IMPRESSION: 1. Right internal carotid artery velocity suggests 40%-59% stenosis. 2. Left internal carotid artery velocity suggests 40%-59% stenosis. 3. Velocities may be underestimated due to acoustic shadowing.  ___________________________________________ Janetta Hora. Fields, MD  EM/MEDQ  D:  04/08/2011  T:  04/09/2011  Job:  [id number]

## 2011-05-11 ENCOUNTER — Encounter: Payer: Self-pay | Admitting: Cardiology

## 2011-05-28 ENCOUNTER — Ambulatory Visit: Payer: Medicare Other | Admitting: Cardiology

## 2011-05-28 ENCOUNTER — Ambulatory Visit: Payer: Medicare Other | Admitting: Adult Health

## 2011-08-24 LAB — CBC
HCT: 39.6
MCV: 85.1
Platelets: 223
RDW: 13
WBC: 5.9

## 2011-08-26 LAB — CBC
Platelets: 209
RBC: 4.77
WBC: 6.8

## 2011-08-26 LAB — BASIC METABOLIC PANEL
BUN: 12
Calcium: 10
Chloride: 100
Creatinine, Ser: 0.67
GFR calc Af Amer: >60
GFR calc non Af Amer: >60

## 2011-09-10 ENCOUNTER — Encounter: Payer: Self-pay | Admitting: Vascular Surgery

## 2011-10-27 ENCOUNTER — Encounter: Payer: Self-pay | Admitting: Vascular Surgery

## 2011-10-28 ENCOUNTER — Ambulatory Visit (INDEPENDENT_AMBULATORY_CARE_PROVIDER_SITE_OTHER): Payer: Medicare Other | Admitting: Vascular Surgery

## 2011-10-28 ENCOUNTER — Other Ambulatory Visit (INDEPENDENT_AMBULATORY_CARE_PROVIDER_SITE_OTHER): Payer: Medicare Other | Admitting: *Deleted

## 2011-10-28 ENCOUNTER — Encounter: Payer: Self-pay | Admitting: Vascular Surgery

## 2011-10-28 ENCOUNTER — Encounter (INDEPENDENT_AMBULATORY_CARE_PROVIDER_SITE_OTHER): Payer: Medicare Other | Admitting: *Deleted

## 2011-10-28 VITALS — BP 149/77 | HR 54 | Resp 16 | Ht 64.0 in | Wt 118.0 lb

## 2011-10-28 DIAGNOSIS — I6529 Occlusion and stenosis of unspecified carotid artery: Secondary | ICD-10-CM

## 2011-10-28 DIAGNOSIS — R109 Unspecified abdominal pain: Secondary | ICD-10-CM

## 2011-10-28 DIAGNOSIS — I739 Peripheral vascular disease, unspecified: Secondary | ICD-10-CM

## 2011-10-28 NOTE — Progress Notes (Signed)
Patient is a 65-year-old female that we have been following for mesenteric stenosis, carotid stenosis, PAD with claudication. She was last seen in May of 2012.  Problem #1 carotid stenosis-the patient continues to deny any symptoms of TIA amaurosis or stroke.  Problem #2 claudication-the patient denies rest pain in her feet. She complains primarily of nocturnal cramps in the left leg which are not consistent with claudication. This is similar to her previous history in May of 2012. Previous arteriogram showed a right popliteal artery occlusion with some distal tibial disease. She has had no nonhealing ulcerations of her foot.  Problem #3 the patient continues to have intermittent episodes of chronic constipation. However, she also complains of now she is experiencing abdominal pain every time she eats. She denies any weight loss. She denies food fear. She continues to smoke one to 2 packs of cigarettes per day.  Review of systems: Cardiac she has occasional left-sided arm pain and left-sided chest pain she has been followed by Dr. Degent for this. She states some of this is related to anxiety.  Pulmonary she has chronic productive cough and wheezing  Psychiatric she has a history of depression and anxiety  Endocrine she has a history of diabetes but states this is reasonably well-controlled on Glucophage  All other systems are negative  Past Medical History  Diagnosis Date  . Diabetes mellitus   . Coronary artery disease     Severe single-vessel/100% occluded right with distal collateralization.Medical therapy/Normal LV fx  . Carotid bruit     Bilateral  . Sinus bradycardia   . Dyslipidemia   . DVT (deep venous thrombosis)     Remote portable as him   . COPD (chronic obstructive pulmonary disease)   . Arthritis   . Depression with anxiety   . Leg pain   . Productive cough   . Peripheral arterial disease   . Carotid artery occlusion      Past Surgical History  Procedure Date    . Parotidectomy     Left  . Shoulder blade     Cysts x 2  . Tonsillectomy and adenoidectomy   . Partial hysterectomy     Subsequent completion later that year,hx of large fibroid tumor  . Appendectomy   . Aortogram     with bilateral lower extremity run-off  . Abdominal hysterectomy     History  Substance Use Topics  . Smoking status: Current Everyday Smoker -- 1.0 packs/day    Types: Cigarettes  . Smokeless tobacco: Not on file  . Alcohol Use: No   Current Outpatient Prescriptions on File Prior to Visit  Medication Sig Dispense Refill  . albuterol (PROVENTIL HFA) 108 (90 BASE) MCG/ACT inhaler Inhale 2 puffs into the lungs every 6 (six) hours as needed.        . albuterol (PROVENTIL) (2.5 MG/3ML) 0.083% nebulizer solution Take 2.5 mg by nebulization every 6 (six) hours as needed.        . ALPRAZolam (XANAX) 1 MG tablet Take 1 mg by mouth at bedtime as needed.        . aspirin 81 MG tablet Take 81 mg by mouth daily.        . Dextromethorphan-Guaifenesin (TUSSIN DM MAX) 10-200 MG/5ML LIQD Take by mouth.        . HYDROcodone-acetaminophen (NORCO) 7.5-325 MG per tablet Take 1 tablet by mouth every 6 (six) hours as needed.        . ibuprofen (ADVIL,MOTRIN) 800 MG tablet Take 800   mg by mouth every 8 (eight) hours as needed.        . metFORMIN (GLUMETZA) 500 MG (MOD) 24 hr tablet Take 500 mg by mouth daily with breakfast.        . metoprolol succinate (TOPROL-XL) 25 MG 24 hr tablet Take 25 mg by mouth daily.       . multivitamin-iron-minerals-folic acid (CENTRUM) chewable tablet Chew 1 tablet by mouth daily.        . nitroGLYCERIN (NITROSTAT) 0.4 MG SL tablet Place 0.4 mg under the tongue every 5 (five) minutes as needed.        . polyethylene glycol (MIRALAX / GLYCOLAX) packet Take 17 g by mouth daily.        . simvastatin (ZOCOR) 40 MG tablet Take 40 mg by mouth at bedtime.        . doxycycline (VIBRAMYCIN) 100 MG capsule Take 100 mg by mouth 2 (two) times daily.        . furosemide  (LASIX) 20 MG tablet Take 20 mg by mouth 2 (two) times daily.        . potassium chloride (KLOR-CON) 10 MEQ CR tablet Take 10 mEq by mouth daily.           Physical exam: Filed Vitals:   10/28/11 1154 10/28/11 1155  BP: 127/62 149/77  Pulse: 81 54  Resp: 16   Height: 5' 4" (1.626 m)   Weight: 118 lb (53.524 kg)   SpO2: 94% 98%    HEENT: Negative  Neck: She has 2+ carotid pulses with a left-sided carotid bruit  Chest clear to auscultation  Cardiac: Regular rate and rhythm  Abdomen: Soft nontender nondistended with an audible epigastric bruit no mass  Musculoskeletal no major joint deformities no significant edema  Neuro: Symmetric upper extremity and lower extremity strength is 5 over 5 the patient continues to have pressured speech and tends to trip from ID and ID it is difficult to keep all task  Skin: Open ulcers or rashes  Vascular: She has 2+ femoral with absent popliteal and pedal pulses bilaterally feet are pink warm and well-perfused  Data: She had a carotid duplex scan which I reviewed and interpreted today. This shows a 60-80% left internal carotid artery stenosis with some slight progression she also is a 40-60% stenosis of the right internal carotid artery. She is antegrade vertebral flow bilaterally  She had a mesenteric duplex exam which I reviewed and interpreted today which shows 75% stenosis of the celiac artery she also had increased velocity of the inferior mesenteric artery there is no significant stenosis of the superior mesenteric artery  She had bilateral ABIs which I reviewed and interpreted today ABI on the right was 0.41 left was 0.89  Impression: #1 stable asymptomatic moderate carotid stenosis with slight progression, it continue antiplatelet therapy  #2 claudication stable symptoms with slight decline of ABIs but no real change in symptoms continue to follow manage conservatively with encouraging walking and smoking cessation  #3 mesenteric  stenosis the patient's symptoms seem to be progressing. I believe this time she needs an arteriogram for further diagnostic evaluation of her mesenteric circulation. We'll also consider angioplasty and stenting of her mesenteric vessels if necessary at that time. Risks benefits and possible complications of the procedure worse when the patient today including but limited to bleeding infection contrast nephropathy vessel injury. She understands and agrees to proceed. We will have to stop her Glucophage for several days periprocedure. Her arteriogram is scheduled for   December 14 

## 2011-11-01 ENCOUNTER — Ambulatory Visit (INDEPENDENT_AMBULATORY_CARE_PROVIDER_SITE_OTHER): Payer: Medicare Other | Admitting: Cardiology

## 2011-11-01 ENCOUNTER — Encounter: Payer: Self-pay | Admitting: Cardiology

## 2011-11-01 DIAGNOSIS — I2510 Atherosclerotic heart disease of native coronary artery without angina pectoris: Secondary | ICD-10-CM

## 2011-11-01 DIAGNOSIS — I771 Stricture of artery: Secondary | ICD-10-CM

## 2011-11-01 DIAGNOSIS — I82409 Acute embolism and thrombosis of unspecified deep veins of unspecified lower extremity: Secondary | ICD-10-CM

## 2011-11-01 DIAGNOSIS — E7800 Pure hypercholesterolemia, unspecified: Secondary | ICD-10-CM | POA: Insufficient documentation

## 2011-11-01 DIAGNOSIS — R079 Chest pain, unspecified: Secondary | ICD-10-CM

## 2011-11-01 DIAGNOSIS — E119 Type 2 diabetes mellitus without complications: Secondary | ICD-10-CM

## 2011-11-01 DIAGNOSIS — K5900 Constipation, unspecified: Secondary | ICD-10-CM

## 2011-11-01 DIAGNOSIS — K551 Chronic vascular disorders of intestine: Secondary | ICD-10-CM | POA: Insufficient documentation

## 2011-11-01 DIAGNOSIS — I7389 Other specified peripheral vascular diseases: Secondary | ICD-10-CM

## 2011-11-01 MED ORDER — ISOSORBIDE MONONITRATE ER 30 MG PO TB24: 30.0000 mg | ORAL_TABLET | Freq: Every day | ORAL | Status: DC

## 2011-11-01 NOTE — Assessment & Plan Note (Signed)
.  This is related to mesenteric stenosis. 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 Further followup with gastroenterology may be indicated

## 2011-11-01 NOTE — Progress Notes (Signed)
Melissa Bottoms, MD, [facility] ABIM Board Certified in Adult Cardiovascular Medicine,Internal Medicine and Critical Care Medicine    CC: Followup patient with coronary artery disease and significant peripheral vascular disease  HPI:  The patient is a 65 year old female with a history of coronary artery disease, normal ejection fraction of 67%. She reports symptoms of chronic stable angina for the last 2 or 3 months. She states when doing heavy exertion, doing some of her housework and making up her bed causes her to have chest tightness increased shortness of breath. This rapidly resolves. However during this period of time she has not taken any nitroglycerin. Recent blood work also showed that her HDL was 92 and LDL cholesterol 46. At times the patient has a sensation of panic, particularly when she gets startled and then she feels a sharp pain in the left side of the breast going to the left axilla which is not consistent with angina. She is currently being evaluated by Dr. Darrick Penna for mesenteric stenosis and has been scheduled on December 14 for a mesenteric angiogram. She's also closely followed for bilateral carotid artery stenosis as well as peripheral vascular disease with chronically occluded right popliteal artery. She now reports also significant pain in the left lower extremity but this is not consistent with claudication. She had vein stripping in the left leg and she has now developed significant venous insufficiency associated with nocturnal cramps.    PMH: reviewed and listed in Problem List in Electronic Records (and see below) Past Medical History  Diagnosis Date  . Diabetes mellitus   . Coronary artery disease     Severe single-vessel/100% occluded right with distal collateralization.Medical therapy/Normal LV fx  . Carotid bruit     Bilateral  . Sinus bradycardia   . Dyslipidemia   . DVT (deep venous thrombosis)     Remote portable as him   . COPD (chronic obstructive  pulmonary disease)   . Arthritis   . Depression with anxiety   . Leg pain   . Productive cough   . Peripheral arterial disease   . Carotid artery occlusion    Past Surgical History  Procedure Date  . Parotidectomy     Left  . Shoulder blade     Cysts x 2  . Tonsillectomy and adenoidectomy   . Partial hysterectomy     Subsequent completion later that year,hx of large fibroid tumor  . Appendectomy   . Aortogram     with bilateral lower extremity run-off  . Abdominal hysterectomy     Allergies/SH/FHX : available in Electronic Records for review  Medications: Current Outpatient Prescriptions  Medication Sig Dispense Refill  . albuterol (PROVENTIL HFA) 108 (90 BASE) MCG/ACT inhaler Inhale 2 puffs into the lungs every 6 (six) hours as needed.        Marland Kitchen albuterol (PROVENTIL) (2.5 MG/3ML) 0.083% nebulizer solution Take 2.5 mg by nebulization every 6 (six) hours as needed.        . ALPRAZolam (XANAX) 1 MG tablet Take 1 mg by mouth at bedtime as needed.        Marland Kitchen aspirin 81 MG tablet Take 81 mg by mouth daily.        Marland Kitchen docusate sodium (COLACE) 100 MG capsule Take 100 mg by mouth 2 (two) times daily.        . furosemide (LASIX) 20 MG tablet Take 20 mg by mouth as needed.       Marland Kitchen HYDROcodone-acetaminophen (NORCO) 7.5-325 MG per tablet Take  1 tablet by mouth every 6 (six) hours as needed.        Marland Kitchen ibuprofen (ADVIL,MOTRIN) 800 MG tablet Take 800 mg by mouth every 8 (eight) hours as needed.        . metFORMIN (GLUMETZA) 500 MG (MOD) 24 hr tablet Take 1,000 mg by mouth 2 (two) times daily with a meal.       . metoprolol succinate (TOPROL-XL) 25 MG 24 hr tablet Take 25 mg by mouth daily.       . multivitamin-iron-minerals-folic acid (CENTRUM) chewable tablet Chew 1 tablet by mouth daily.        . nitroGLYCERIN (NITROSTAT) 0.4 MG SL tablet Place 0.4 mg under the tongue every 5 (five) minutes as needed.        . potassium chloride (KLOR-CON) 10 MEQ CR tablet Take 10 mEq by mouth as needed.         . simvastatin (ZOCOR) 40 MG tablet Take 40 mg by mouth at bedtime.          ROS: No nausea or vomiting. No fever or chills.No melena or hematochezia.No bleeding.claudication in the right lower extremity but not in the left extremity. Postprandial abdominal pain. Chronic constipation  Physical Exam: BP 127/64  Pulse 60  Resp 18  Ht 5\' 4"  (1.626 m)  Wt 120 lb (54.432 kg)  BMI 20.60 kg/m2 General: Well-nourished white female in no apparent distress. Neck: Normal carotid upstroke with bilateral carotid bruits left greater than right. No thyromegaly nonnodular thyroid Lungs: Diminished breath sounds bilaterally but without wheezing. Some scattered rhonchi. Cardiac: Regular rate and rhythm with normal S1-S2 no murmur rubs or gallops Vascular: No edema. Unable to palpate dorsalis pedis and posterior pulse in the right leg. Faintly palpable pulse dorsalis pedis left leg. Notable varicose veins in the left lower extremity. Skin: Warm and dry  12lead ECG: Normal sinus rhythm with right bundle branch block. Limited bedside ECHO:N/A   Assessment and Plan

## 2011-11-01 NOTE — Assessment & Plan Note (Signed)
Lipid panel at goal. Continue current regimen.

## 2011-11-01 NOTE — Assessment & Plan Note (Signed)
I reinforced again to the patient to check with Dr. fields office regarding her mesenteric angiogram which has been scheduled for December 14.

## 2011-11-01 NOTE — Assessment & Plan Note (Signed)
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 Followed by Dr. Darrick Penna.

## 2011-11-01 NOTE — Patient Instructions (Signed)
   Begin Imdur 30mg  daily  Echo  If the results of your test are normal or stable, you will receive a letter.  If they are abnormal, the nurse will contact you by phone. Call Dr. Darrick Penna office regarding testing for December 14 Your physician wants you to follow up in: 6 months.  You will receive a reminder letter in the mail one-two months in advance.  If you don't receive a letter, please call our office to schedule the follow up appointment

## 2011-11-01 NOTE — Assessment & Plan Note (Signed)
Hemoglobin A1c patient to discuss with her primary care physician.

## 2011-11-01 NOTE — Assessment & Plan Note (Signed)
No evidence of clinic recurrent DVT.

## 2011-11-01 NOTE — Assessment & Plan Note (Signed)
Patient has a history of a chronically occluded RCA with left to right collaterals a normal ejection fraction. Symptoms however are concerning for angina and I added Imdur 30 minutes a day to the patient's medical regimen. We will also get an echocardiogram to assess her LV function. If patient continues to have symptoms of chest pain we will consider a cardiac catheterization.

## 2011-11-02 ENCOUNTER — Other Ambulatory Visit: Payer: Self-pay

## 2011-11-04 ENCOUNTER — Encounter (HOSPITAL_COMMUNITY): Payer: Self-pay

## 2011-11-05 NOTE — Procedures (Unsigned)
CAROTID DUPLEX EXAM  INDICATION:  Stenosis.  HISTORY: Diabetes:  Yes. Cardiac:  Yes. Hypertension:  No. Smoking:  Yes. Previous Surgery:  No. CV History:  Complaint of left neck swelling. Amaurosis Fugax No, Paresthesias No, Hemiparesis No.                                      RIGHT             LEFT Brachial systolic pressure:         131               127 Brachial Doppler waveforms:         Normal            Normal Vertebral direction of flow:        Antegrade         Antegrade DUPLEX VELOCITIES (cm/sec) CCA peak systolic                   86                110 ECA peak systolic                   52                49 ICA peak systolic                   170               243 ICA end diastolic                   55                78 PLAQUE MORPHOLOGY:                  Mixed             Mixed PLAQUE AMOUNT:                      Moderate          Moderate PLAQUE LOCATION:                    ICA/ECA/CCA       ICA/ECA/CCA  IMPRESSION: 1. Doppler velocities suggest a 40% to 59% stenosis of the right     proximal internal carotid artery and a 60% to 79% stenosis of the     left proximal internal carotid artery. 2. Significant increase in the velocities of the left internal carotid     artery when compared to the previous examination on 04/08/2011 with     the right internal carotid artery remaining stable.  ___________________________________________ Janetta Hora. Fields, MD  CH/MEDQ  D:  10/28/2011  T:  10/28/2011  Job:  [id number]

## 2011-11-05 NOTE — Procedures (Unsigned)
MESENTERIC ARTERIAL DUPLEX EVALUATION  INDICATION:  Abdominal pain, constipation, known celiac stenosis.  HISTORY: Diabetes:  Yes. Cardiac:  Yes. Hypertension:  No. Smoking:  Yes.  Mesenteric Duplex Findings: Aorta - Proximal                            69 Aorta - Mid                                 73 Aorta - Distal                              78  Celiac Trunk - Proximal                     273/60 Celiac Trunk - Distal                       331/60  Hepatic Artery                              229 Splenic Artery                              176  Superior Mesenteric Artery-Origin           164/16 Superior Mesenteric Artery-Proximal         182/16 Superior Mesenteric Artery-Mid              140/17 Superior Mesenteric Artery-Distal           122/16  Inferior Mesenteric Artery-Proximal         287/24  IMPRESSION: 1. Doppler velocities suggest a >75% stenosis of the celiac artery. 2. Patent abdominal aorta, common hepatic, splenic, and superior     mesenteric arteries with nonocclusive, diffuse atherosclerosis     noted throughout the abdominal aorta.  Velocity of 287/24 cm/s     noted in the proximal inferior mesenteric artery.  ___________________________________________ Janetta Hora Fields, MD  CH/MEDQ  D:  10/28/2011  T:  10/28/2011  Job:  [id number]

## 2011-11-10 ENCOUNTER — Other Ambulatory Visit (INDEPENDENT_AMBULATORY_CARE_PROVIDER_SITE_OTHER): Payer: Medicare Other | Admitting: *Deleted

## 2011-11-10 DIAGNOSIS — R072 Precordial pain: Secondary | ICD-10-CM

## 2011-11-10 DIAGNOSIS — I2510 Atherosclerotic heart disease of native coronary artery without angina pectoris: Secondary | ICD-10-CM

## 2011-11-12 ENCOUNTER — Ambulatory Visit (HOSPITAL_COMMUNITY)
Admission: RE | Admit: 2011-11-12 | Discharge: 2011-11-12 | Disposition: A | Discharge status: Home / Self Care | Payer: Medicare Other | Source: Ambulatory Visit | Attending: Vascular Surgery | Admitting: Vascular Surgery

## 2011-11-12 ENCOUNTER — Encounter (HOSPITAL_COMMUNITY)
Admission: RE | Disposition: A | Discharge status: Home / Self Care | Payer: Self-pay | Source: Ambulatory Visit | Attending: Vascular Surgery

## 2011-11-12 DIAGNOSIS — I748 Embolism and thrombosis of other arteries: Secondary | ICD-10-CM | POA: Insufficient documentation

## 2011-11-12 DIAGNOSIS — E119 Type 2 diabetes mellitus without complications: Secondary | ICD-10-CM | POA: Insufficient documentation

## 2011-11-12 DIAGNOSIS — I708 Atherosclerosis of other arteries: Secondary | ICD-10-CM

## 2011-11-12 DIAGNOSIS — K551 Chronic vascular disorders of intestine: Secondary | ICD-10-CM | POA: Insufficient documentation

## 2011-11-12 DIAGNOSIS — I739 Peripheral vascular disease, unspecified: Secondary | ICD-10-CM

## 2011-11-12 DIAGNOSIS — I774 Celiac artery compression syndrome: Secondary | ICD-10-CM | POA: Insufficient documentation

## 2011-11-12 HISTORY — PX: VISCERAL ANGIOGRAM: SHX5515

## 2011-11-12 HISTORY — PX: ABDOMINAL ANGIOGRAM: SHX5499

## 2011-11-12 HISTORY — PX: LOWER EXTREMITY ANGIOGRAM: SHX5508

## 2011-11-12 LAB — GLUCOSE, CAPILLARY: Glucose-Capillary: 113 mg/dL — ABNORMAL HIGH (ref 70–99)

## 2011-11-12 SURGERY — ABDOMINAL ANGIOGRAM
Anesthesia: LOCAL

## 2011-11-12 MED ORDER — MIDAZOLAM HCL 2 MG/2ML IJ SOLN
INTRAMUSCULAR | Status: AC
  Filled 2011-11-12: qty 2

## 2011-11-12 MED ORDER — HYDROCODONE-ACETAMINOPHEN 5-325 MG PO TABS
1.0000 | ORAL_TABLET | Freq: Once | ORAL | Status: AC
  Administered 2011-11-12: 1 via ORAL

## 2011-11-12 MED ORDER — METOPROLOL TARTRATE 1 MG/ML IV SOLN: 2.0000 mg | INTRAVENOUS | Status: DC | PRN

## 2011-11-12 MED ORDER — OXYCODONE HCL 5 MG PO TABS: 5.0000 mg | ORAL_TABLET | ORAL | Status: DC | PRN

## 2011-11-12 MED ORDER — HYDROCODONE-ACETAMINOPHEN 7.5-325 MG PO TABS: 1.0000 | ORAL_TABLET | Freq: Once | ORAL | Status: DC

## 2011-11-12 MED ORDER — ACETAMINOPHEN 325 MG RE SUPP: 325.0000 mg | RECTAL | Status: DC | PRN

## 2011-11-12 MED ORDER — HYDROCODONE-ACETAMINOPHEN 5-325 MG PO TABS
ORAL_TABLET | ORAL | Status: AC
  Filled 2011-11-12: qty 1

## 2011-11-12 MED ORDER — SODIUM CHLORIDE 0.9 % IV SOLN: 500.0000 mL | INTRAVENOUS | Status: DC

## 2011-11-12 MED ORDER — HEPARIN (PORCINE) IN NACL 2-0.9 UNIT/ML-% IJ SOLN
INTRAMUSCULAR | Status: AC
  Filled 2011-11-12: qty 1000

## 2011-11-12 MED ORDER — FENTANYL CITRATE 0.05 MG/ML IJ SOLN
INTRAMUSCULAR | Status: AC
  Filled 2011-11-12: qty 2

## 2011-11-12 MED ORDER — GUAIFENESIN-DM 100-10 MG/5ML PO SYRP: 15.0000 mL | ORAL_SOLUTION | ORAL | Status: DC | PRN

## 2011-11-12 MED ORDER — ALPRAZOLAM 0.5 MG PO TABS
0.5000 mg | ORAL_TABLET | Freq: Once | ORAL | Status: AC | PRN
  Administered 2011-11-12: 0.5 mg via ORAL

## 2011-11-12 MED ORDER — HYDRALAZINE HCL 20 MG/ML IJ SOLN: 10.0000 mg | INTRAMUSCULAR | Status: DC | PRN

## 2011-11-12 MED ORDER — LIDOCAINE HCL (PF) 1 % IJ SOLN
INTRAMUSCULAR | Status: AC
  Filled 2011-11-12: qty 30

## 2011-11-12 MED ORDER — PHENOL 1.4 % MT LIQD: 1.0000 | OROMUCOSAL | Status: DC | PRN

## 2011-11-12 MED ORDER — ACETAMINOPHEN 325 MG PO TABS: 325.0000 mg | ORAL_TABLET | ORAL | Status: DC | PRN

## 2011-11-12 MED ORDER — ALPRAZOLAM 0.25 MG PO TABS
ORAL_TABLET | ORAL | Status: AC
  Filled 2011-11-12: qty 2

## 2011-11-12 MED ORDER — SODIUM CHLORIDE 0.9 % IV SOLN: INTRAVENOUS | Status: DC

## 2011-11-12 MED ORDER — LABETALOL HCL 5 MG/ML IV SOLN: 10.0000 mg | INTRAVENOUS | Status: DC | PRN

## 2011-11-12 MED ORDER — MORPHINE SULFATE 2 MG/ML IJ SOLN: 2.0000 mg | INTRAMUSCULAR | Status: DC | PRN

## 2011-11-12 MED ORDER — ONDANSETRON HCL 4 MG/2ML IJ SOLN: 4.0000 mg | Freq: Four times a day (QID) | INTRAMUSCULAR | Status: DC | PRN

## 2011-11-12 NOTE — Interval H&P Note (Signed)
History and Physical Interval Note:  11/12/2011 7:15 AM  Melissa Holland  has presented today for surgery, with the diagnosis of Stenosis  The various methods of treatment have been discussed with the patient and family. After consideration of risks, benefits and other options for treatment, the patient has consented to  Procedure(s): ABDOMINAL ANGIOGRAM as a surgical intervention .  The patients' history has been reviewed, patient examined, no change in status, stable for surgery.  I have reviewed the patients' chart and labs.  Questions were answered to the patient's satisfaction.     Saahil Herbster E

## 2011-11-12 NOTE — Op Note (Signed)
Procedure: Mesenteric angiogram and aortogram with bilateral lower extremity runoff  Preoperative diagnosis: Abdominal pain  Postoperative diagnosis: Same  Anesthesia local with sedation  Operative findings  : 1.  40% celiac stenosis, 0% SMA stenosis, 40% IMA stenosis    2.  exophytic plaque left common femoral artery greater than 90% stenosis, greater than 90% stenosis of left tibial peroneal trunk and anterior tibial artery origin, three-vessel runoff to the left foot with patent left superficial femoral artery     3. Right lower extremity superficial femoral artery occlusion, patent anterior tibial artery, occlusion of right tibial peroneal trunk, three-vessel runoff to the right foot  Operative details: After obtaining informed consent, the patient was taken to the [facility]. The patient was placed in supine position on the Angio table. Both groins were prepped and draped in usual sterile fashion. Local anesthesia was infiltrated over the right common femoral artery. An introducer needle was used to cannulate the right common femoral artery. There was good backbleeding from this. However the guidewire would not advance. There was some calcification in the common femoral artery. Attempts to advance the guidewire were abandoned on the right side. Local anesthesia was infiltrated in the left common femoral artery. An attempt was also made to advance the Versacore wire on this side but this would not easily. There was good pulsatile flow from the needle. I was able to advance a micropuncture wire through the needle into the left iliac system. The needle was removed and a micropuncture sheath placed over the guidewire and an 035 versacore wire threaded in the abdominal aorta under fluoroscopic guidance. The micropuncture sheath was then removed and a 5 French sheath placed over this and thoroughly flushed with heparinized saline. A 5 French pigtail catheter was placed over the guidewire into the abdominal  aorta and abdominal aortogram was obtained. The infrarenal abdominal aorta is patent. The celiac artery has a 40% stenosis near its origin. The superior mesenteric artery is widely patent with no significant stenosis. The pigtail catheter is then moved down below the celiac artery and an oblique view obtained to examine the origin of the inferior mesenteric artery. This also had a 40% stenosis. The left and right common internal and external iliac arteries are patent. The catheter was then pulled up just above the aortic bifurcation and a lower extremity runoff was obtained. This also shows the left to right common femoral arteries are patent but the left common femoral artery has a large calcified exophytic plaque causing a 90% stenosis. This shows a patent common femoral and profunda femoris artery.    In the right lower extremity the right superficial femoral artery is occluded. The popliteal artery is patent but small. The anterior tibial artery is patent throughout its course to the foot. The tibioperoneal trunk is occluded. There is reconstitution of the peroneal and posterior tibial arteries to do fill all the way to the foot.  In the left lower extremity the left superficial femoral artery and popliteal artery is patent. The left tibioperoneal trunk is occluded. The proximal anterior tibial artery is occluded. All 3 tibial vessels however are reconstituted by collaterals and fill the left foot. The pigtail catheter was then pulled back over guidewire and the 5 French sheath thoroughly flushed with heparinized saline. The 5 French sheath was left in place to be pulled in the holding area.  The patient tolerated the procedure well and there were no complications. Patient was taken to the holding area in stable condition.  Fabienne Bruns, MD [facility] Office: [phone] Pager: [phone]  Operative management: Findings were discussed with Dr. Durene Cal. He  has previously done the patient's bypass procedure. He will determine whether or not stenting of the superficial femoral artery lesion versus revision with a femoral to above-knee popliteal bypass with me in the past patient's best interest and will discuss this with him.   Fabienne Bruns, MD [facility] Office: [phone] Pager: [phone]

## 2011-11-12 NOTE — H&P (View-Only) (Signed)
Patient is a 65 year old female that we have been following for mesenteric stenosis, carotid stenosis, PAD with claudication. She was last seen in May of 2012.  Problem #1 carotid stenosis-the patient continues to deny any symptoms of TIA amaurosis or stroke.  Problem #2 claudication-the patient denies rest pain in her feet. She complains primarily of nocturnal cramps in the left leg which are not consistent with claudication. This is similar to her previous history in May of 2012. Previous arteriogram showed a right popliteal artery occlusion with some distal tibial disease. She has had no nonhealing ulcerations of her foot.  Problem #3 the patient continues to have intermittent episodes of chronic constipation. However, she also complains of now she is experiencing abdominal pain every time she eats. She denies any weight loss. She denies food fear. She continues to smoke one to 2 packs of cigarettes per day.  Review of systems: Cardiac she has occasional left-sided arm pain and left-sided chest pain she has been followed by Dr. Andee Lineman for this. She states some of this is related to anxiety.  Pulmonary she has chronic productive cough and wheezing  Psychiatric she has a history of depression and anxiety  Endocrine she has a history of diabetes but states this is reasonably well-controlled on Glucophage  All other systems are negative  Past Medical History  Diagnosis Date  . Diabetes mellitus   . Coronary artery disease     Severe single-vessel/100% occluded right with distal collateralization.Medical therapy/Normal LV fx  . Carotid bruit     Bilateral  . Sinus bradycardia   . Dyslipidemia   . DVT (deep venous thrombosis)     Remote portable as him   . COPD (chronic obstructive pulmonary disease)   . Arthritis   . Depression with anxiety   . Leg pain   . Productive cough   . Peripheral arterial disease   . Carotid artery occlusion      Past Surgical History  Procedure Date    . Parotidectomy     Left  . Shoulder blade     Cysts x 2  . Tonsillectomy and adenoidectomy   . Partial hysterectomy     Subsequent completion later that year,hx of large fibroid tumor  . Appendectomy   . Aortogram     with bilateral lower extremity run-off  . Abdominal hysterectomy     History  Substance Use Topics  . Smoking status: Current Everyday Smoker -- 1.0 packs/day    Types: Cigarettes  . Smokeless tobacco: Not on file  . Alcohol Use: No   Current Outpatient Prescriptions on File Prior to Visit  Medication Sig Dispense Refill  . albuterol (PROVENTIL HFA) 108 (90 BASE) MCG/ACT inhaler Inhale 2 puffs into the lungs every 6 (six) hours as needed.        Marland Kitchen albuterol (PROVENTIL) (2.5 MG/3ML) 0.083% nebulizer solution Take 2.5 mg by nebulization every 6 (six) hours as needed.        . ALPRAZolam (XANAX) 1 MG tablet Take 1 mg by mouth at bedtime as needed.        Marland Kitchen aspirin 81 MG tablet Take 81 mg by mouth daily.        Marland Kitchen Dextromethorphan-Guaifenesin (TUSSIN DM MAX) 10-200 MG/5ML LIQD Take by mouth.        Marland Kitchen HYDROcodone-acetaminophen (NORCO) 7.5-325 MG per tablet Take 1 tablet by mouth every 6 (six) hours as needed.        Marland Kitchen ibuprofen (ADVIL,MOTRIN) 800 MG tablet Take 800  mg by mouth every 8 (eight) hours as needed.        . metFORMIN (GLUMETZA) 500 MG (MOD) 24 hr tablet Take 500 mg by mouth daily with breakfast.        . metoprolol succinate (TOPROL-XL) 25 MG 24 hr tablet Take 25 mg by mouth daily.       . multivitamin-iron-minerals-folic acid (CENTRUM) chewable tablet Chew 1 tablet by mouth daily.        . nitroGLYCERIN (NITROSTAT) 0.4 MG SL tablet Place 0.4 mg under the tongue every 5 (five) minutes as needed.        . polyethylene glycol (MIRALAX / GLYCOLAX) packet Take 17 g by mouth daily.        . simvastatin (ZOCOR) 40 MG tablet Take 40 mg by mouth at bedtime.        Marland Kitchen doxycycline (VIBRAMYCIN) 100 MG capsule Take 100 mg by mouth 2 (two) times daily.        . furosemide  (LASIX) 20 MG tablet Take 20 mg by mouth 2 (two) times daily.        . potassium chloride (KLOR-CON) 10 MEQ CR tablet Take 10 mEq by mouth daily.           Physical exam: Filed Vitals:   10/28/11 1154 10/28/11 1155  BP: 127/62 149/77  Pulse: 81 54  Resp: 16   Height: 5\' 4"  (1.626 m)   Weight: 118 lb (53.524 kg)   SpO2: 94% 98%    HEENT: Negative  Neck: She has 2+ carotid pulses with a left-sided carotid bruit  Chest clear to auscultation  Cardiac: Regular rate and rhythm  Abdomen: Soft nontender nondistended with an audible epigastric bruit no mass  Musculoskeletal no major joint deformities no significant edema  Neuro: Symmetric upper extremity and lower extremity strength is 5 over 5 the patient continues to have pressured speech and tends to trip from ID and ID it is difficult to keep all task  Skin: Open ulcers or rashes  Vascular: She has 2+ femoral with absent popliteal and pedal pulses bilaterally feet are pink warm and well-perfused  Data: She had a carotid duplex scan which I reviewed and interpreted today. This shows a 60-80% left internal carotid artery stenosis with some slight progression she also is a 40-60% stenosis of the right internal carotid artery. She is antegrade vertebral flow bilaterally  She had a mesenteric duplex exam which I reviewed and interpreted today which shows 75% stenosis of the celiac artery she also had increased velocity of the inferior mesenteric artery there is no significant stenosis of the superior mesenteric artery  She had bilateral ABIs which I reviewed and interpreted today ABI on the right was 0.41 left was 0.89  Impression: #1 stable asymptomatic moderate carotid stenosis with slight progression, it continue antiplatelet therapy  #2 claudication stable symptoms with slight decline of ABIs but no real change in symptoms continue to follow manage conservatively with encouraging walking and smoking cessation  #3 mesenteric  stenosis the patient's symptoms seem to be progressing. I believe this time she needs an arteriogram for further diagnostic evaluation of her mesenteric circulation. We'll also consider angioplasty and stenting of her mesenteric vessels if necessary at that time. Risks benefits and possible complications of the procedure worse when the patient today including but limited to bleeding infection contrast nephropathy vessel injury. She understands and agrees to proceed. We will have to stop her Glucophage for several days periprocedure. Her arteriogram is scheduled for  December 14

## 2011-11-16 LAB — POCT I-STAT, CHEM 8
BUN: 14 mg/dL (ref 6–23)
Calcium, Ion: 1.2 mmol/L (ref 1.12–1.32)
Chloride: 100 mEq/L (ref 96–112)
Creatinine, Ser: 0.8 mg/dL (ref 0.50–1.10)
TCO2: 28 mmol/L (ref 0–100)

## 2012-03-20 ENCOUNTER — Ambulatory Visit (INDEPENDENT_AMBULATORY_CARE_PROVIDER_SITE_OTHER): Payer: Medicare Other | Admitting: Cardiology

## 2012-03-20 ENCOUNTER — Encounter: Payer: Self-pay | Admitting: Cardiology

## 2012-03-20 VITALS — BP 118/64 | HR 55 | Ht 64.0 in | Wt 121.0 lb

## 2012-03-20 DIAGNOSIS — E7800 Pure hypercholesterolemia, unspecified: Secondary | ICD-10-CM

## 2012-03-20 DIAGNOSIS — F17200 Nicotine dependence, unspecified, uncomplicated: Secondary | ICD-10-CM

## 2012-03-20 DIAGNOSIS — I2510 Atherosclerotic heart disease of native coronary artery without angina pectoris: Secondary | ICD-10-CM

## 2012-03-20 DIAGNOSIS — Z720 Tobacco use: Secondary | ICD-10-CM

## 2012-03-20 DIAGNOSIS — I7389 Other specified peripheral vascular diseases: Secondary | ICD-10-CM

## 2012-03-20 MED ORDER — ISOSORBIDE MONONITRATE ER 60 MG PO TB24: 60.0000 mg | ORAL_TABLET | ORAL | Status: DC

## 2012-03-20 NOTE — Assessment & Plan Note (Signed)
Given her severe vascular disease and ongoing risk factors I would suggest a goal LDL less than 70. I don't have recent labs and will defer to Fredirick Maudlin, MD

## 2012-03-20 NOTE — Assessment & Plan Note (Signed)
I will send this note to Dr. Darrick Penna. She's quite confused about the procedure she had late last year. She wonders if there is a plan for revascularization. She's having worsening symptoms.

## 2012-03-20 NOTE — Progress Notes (Signed)
HPI  The patient presents for evaluation of chest discomfort. She saw Dr. [name] recently and mentioned this. She has known coronary disease with her catheterization from 2008 described below. She continues to smoke cigarettes. She has severe peripheral vascular disease. She saw Dr. Darrick Penna late last year and had lower extremity arteriogram. This demonstrated 40% celiac stenosis, 0% SMA stenosis, 40% IMA stenosis, exophytic plaque left common femoral artery greater than 90% stenosis, greater than 90% stenosis of left tibial peroneal trunk and anterior tibial artery origin, three-vessel runoff to the left foot with patent left superficial femoral artery and right lower extremity superficial femoral artery occlusion, patent anterior tibial artery, occlusion of right tibial peroneal trunk, three-vessel runoff to the right foot. She's been quite concerned since this procedure she says she's had worsening left leg symptoms. She has new varicose veins she reports in her left leg. She has severe leg weakness. She's having some resting leg pain.   From a cardiovascular standpoint she is describing extreme fatigue. She has left upper chest discomfort and shoulder discomfort anytime she becomes fatigued or when she is anxious. This sometimes happens at night. She has not taken sublingual mitral glycerin. She'll take comments, Sprite and Xanax and her symptoms will resolve. She says she is very fatigued with any activity such as vacuuming. She gets anxious. She's not sure that she can bring on the chest discomfort with activities. Sometimes this happens at other times not. She has chronic dyspnea but is not describing PND or orthopnea. She denies any palpitations, presyncope or syncope. She's not describing associated symptoms with her chest discomfort. She unfortunately has been unable to quit smoking cigarettes.  Allergies  Allergen Reactions  . Penicillins Anaphylaxis  . Sulfonamide Derivatives Anaphylaxis  .  Alupent (Metaproterenol Sulfate) Other (See Comments)    Makes pt "nervous"  . Azithromycin Other (See Comments)    unknown  . Omnipaque (Iohexol) Other (See Comments)    unknown  . Paroxetine Other (See Comments)    unknown  . Varenicline Tartrate Other (See Comments)    unknown  . Benadryl (Altaryl) Other (See Comments)    Can't breath  . Hydrocortisone Hives  . Prednisone Hives    Current Outpatient Prescriptions  Medication Sig Dispense Refill  . albuterol (PROVENTIL HFA) 108 (90 BASE) MCG/ACT inhaler Inhale 2 puffs into the lungs every 6 (six) hours as needed. For shortness of breath.      Marland Kitchen albuterol (PROVENTIL) (2.5 MG/3ML) 0.083% nebulizer solution Take 2.5 mg by nebulization every 6 (six) hours as needed. For shortness of breath.      . ALPRAZolam (XANAX) 1 MG tablet Take 1 mg by mouth 4 (four) times daily as needed. For nerves      . aspirin 81 MG tablet Take 81 mg by mouth daily.        . bisacodyl (DULCOLAX) 5 MG EC tablet Take 20 mg by mouth daily as needed. For bowel movement.       . furosemide (LASIX) 20 MG tablet Take 20 mg by mouth as needed. For swelling.      Marland Kitchen HYDROcodone-acetaminophen (NORCO) 7.5-325 MG per tablet Take 1 tablet by mouth every 6 (six) hours as needed. For pain.      . isosorbide mononitrate (IMDUR) 30 MG 24 hr tablet Take 1 tablet (30 mg total) by mouth daily.  90 tablet  3  . lactose free nutrition (BOOST) LIQD Take 1 Container by mouth daily.      . metFORMIN (  GLUCOPHAGE) 500 MG tablet Take 1,000 mg by mouth 2 (two) times daily with a meal.        . metoprolol succinate (TOPROL-XL) 25 MG 24 hr tablet Take 12.5 mg by mouth every morning.       . nitroGLYCERIN (NITROSTAT) 0.4 MG SL tablet Place 0.4 mg under the tongue every 5 (five) minutes as needed. For chest pain.      . potassium chloride (KLOR-CON) 10 MEQ CR tablet Take 10 mEq by mouth as needed. When taking lasix.      Marland Kitchen simvastatin (ZOCOR) 40 MG tablet Take 40 mg by mouth at bedtime.            Past Medical History  Diagnosis Date  . Diabetes mellitus   . Coronary artery disease     Severe single-vessel/100% occluded right with distal collateralization.Medical therapy/Normal LV fx  . Carotid bruit     Bilateral  . Sinus bradycardia   . Dyslipidemia   . DVT (deep venous thrombosis)     Remote portable as him   . COPD (chronic obstructive pulmonary disease)   . Arthritis   . Depression with anxiety   . Leg pain   . Productive cough   . Peripheral arterial disease   . Carotid artery occlusion     Past Surgical History  Procedure Date  . Parotidectomy     Left  . Shoulder blade     Cysts x 2  . Tonsillectomy and adenoidectomy   . Partial hysterectomy     Subsequent completion later that year,hx of large fibroid tumor  . Appendectomy   . Aortogram     with bilateral lower extremity run-off  . Abdominal hysterectomy     ROS:  As stated in the HPI and negative for all other systems.  PHYSICAL EXAM BP 118/64  Pulse 55  Ht 5\' 4"  (1.626 m)  Wt 121 lb (54.885 kg)  BMI 20.77 kg/m2  SpO2 98% GENERAL:  Frail appearing HEENT:  Pupils equal round and reactive, fundi not visualized, oral mucosa unremarkable, edentulous NECK:  No jugular venous distention, waveform within normal limits, carotid upstroke brisk and symmetric, left greater than right bruits, no thyromegaly LYMPHATICS:  No cervical, inguinal adenopathy LUNGS:  Clear to auscultation bilaterally BACK:  No CVA tenderness CHEST:  Unremarkable HEART:  PMI not displaced or sustained,S1 and S2 within normal limits, no S3, no S4, no clicks, no rubs, no murmurs ABD:  Flat, positive bowel sounds normal in frequency in pitch, no bruits, no rebound, no guarding, no midline pulsatile mass, no hepatomegaly, no splenomegaly EXT:  2 plus pulses upper, 2+ blood cells feet is, absent left posterior tibialis, absent right dorsalis pedis and posterior tibialis, bilateral femoral bruits, rubor dependent of the right foot,  no edema, no cyanosis no clubbing SKIN:  No rashes no nodules NEURO:  Cranial nerves II through XII grossly intact, motor grossly intact throughout PSYCH:  Cognitively intact, oriented to person place and time  EKG:  Sinus rhythm, right bundle branch block, rate 57, no acute ST-T wave changes.03/20/2012   ASSESSMENT AND PLAN

## 2012-03-20 NOTE — Patient Instructions (Signed)
Follow up in 2 weeks. Increase Imdur (isosorbide) to 60 mg. You may take _2_ of your _30_ mg tablets daily until gone, and then get new prescription filled for _60_ mg tablets. A new prescription was sent to your pharmacy to reflect this change.

## 2012-03-20 NOTE — Assessment & Plan Note (Signed)
The patient has known coronary disease and symptoms as described. It's unclear whether this represents worsening angina. I'm going to pursue medical management first did see her back syndrome to reevaluate. I will increase her Imdur. Her heart rate is low so I don't think she would tolerate a higher dose of beta blocker. We may need to repeat cardiac catheterization. I have reviewed her recent lower angiogram procedure note and it sounds like this might be very difficult from a femoral approach. She might however be a radial approach candidate.

## 2012-03-20 NOTE — Assessment & Plan Note (Signed)
She has had multiple educational episodes and cannot quit smoking.

## 2012-03-21 ENCOUNTER — Other Ambulatory Visit: Payer: Self-pay | Admitting: *Deleted

## 2012-03-21 ENCOUNTER — Telehealth: Payer: Self-pay | Admitting: *Deleted

## 2012-03-21 DIAGNOSIS — I6529 Occlusion and stenosis of unspecified carotid artery: Secondary | ICD-10-CM

## 2012-03-21 DIAGNOSIS — I739 Peripheral vascular disease, unspecified: Secondary | ICD-10-CM

## 2012-03-21 DIAGNOSIS — Z48812 Encounter for surgical aftercare following surgery on the circulatory system: Secondary | ICD-10-CM

## 2012-03-21 NOTE — Telephone Encounter (Signed)
Message left on nurse' s voicemail that she can't take proventil and wanted it taken off her med list. Corrections made to profile per patient's request. Called and informed patient that med list corrected and if she wanted a copy it would be available for her.

## 2012-03-30 ENCOUNTER — Ambulatory Visit: Payer: Medicare Other | Admitting: Vascular Surgery

## 2012-03-30 ENCOUNTER — Other Ambulatory Visit: Payer: Medicare Other

## 2012-04-06 ENCOUNTER — Encounter: Payer: Self-pay | Admitting: Physician Assistant

## 2012-04-06 ENCOUNTER — Ambulatory Visit (INDEPENDENT_AMBULATORY_CARE_PROVIDER_SITE_OTHER): Payer: Medicare Other | Admitting: Physician Assistant

## 2012-04-06 VITALS — BP 122/63 | HR 69 | Ht 64.0 in | Wt 121.0 lb

## 2012-04-06 DIAGNOSIS — E7800 Pure hypercholesterolemia, unspecified: Secondary | ICD-10-CM

## 2012-04-06 DIAGNOSIS — I2510 Atherosclerotic heart disease of native coronary artery without angina pectoris: Secondary | ICD-10-CM

## 2012-04-06 DIAGNOSIS — E119 Type 2 diabetes mellitus without complications: Secondary | ICD-10-CM

## 2012-04-06 DIAGNOSIS — I7389 Other specified peripheral vascular diseases: Secondary | ICD-10-CM

## 2012-04-06 MED ORDER — AMLODIPINE BESYLATE 5 MG PO TABS: 5.0000 mg | ORAL_TABLET | Freq: Every day | ORAL | Status: DC

## 2012-04-06 MED ORDER — NITROGLYCERIN 0.4 MG SL SUBL: 0.4000 mg | SUBLINGUAL_TABLET | SUBLINGUAL | Status: AC | PRN

## 2012-04-06 MED ORDER — ATORVASTATIN CALCIUM 40 MG PO TABS: 40.0000 mg | ORAL_TABLET | Freq: Every evening | ORAL | Status: DC

## 2012-04-06 NOTE — Assessment & Plan Note (Signed)
Patient is scheduled to follow with Dr. Fabienne Bruns next week.

## 2012-04-06 NOTE — Assessment & Plan Note (Signed)
Followed by primary M.D. 

## 2012-04-06 NOTE — Patient Instructions (Addendum)
   Nitroglycerin refill sent to pharmacy.    Begin Norvasc 5mg  daily  Stop Zocor   Begin Lipitor 40mg  every evening  Your physician recommends that you go to the [facility] lab for FLP / LFT in 12 weeks.  Will mail reminder when time.   Follow up in  2 weeks

## 2012-04-06 NOTE — Assessment & Plan Note (Signed)
Will discontinue simvastatin and start Lipitor 40 mg daily, so as to overlay possible interaction between amlodipine and simvastatin. We'll reassess lipid status in 12 weeks. Aggressive management recommended with target LDL 70 or less, if feasible.

## 2012-04-06 NOTE — Assessment & Plan Note (Signed)
I discussed the options with her of either proceeding with a diagnostic coronary angiogram or a stress test. I also indicated that my preference would be for diagnostic catheterization, for definitive exclusion of any CAD progression. Of note, she has documented 100% occlusion of the RCA with distal collateralization, by prior study 2008. She has been treated medically since then, and had a low risk Cardiolite, here at [facility], in 2011. However, she does suggest some worsening of her symptoms, including significant associated DOE. I suggested adjusting her current medication regimen by adding low-dose amlodipine, and scheduling early followup with me/Dr. Degent in 2 weeks for reassessment. If she continues to have symptoms, I strongly recommend that she be scheduled for a cardiac catheterization at that time. NTG will also be reviewed. The patient was in agreement with this recommendation.

## 2012-04-06 NOTE — Progress Notes (Addendum)
HPI: Patient presents for early scheduled followup, per Dr. Antoine Poche, following recent office visit for evaluation of CP, with known CAD.  At that time, Dr. Antoine Poche recommended optimizing medication regimen and increased Imdur to 60 mg daily. Up titration of beta blocker was precluded by SB 55 bpm. He also indicated that patient might need a repeat catheterization, last studied in 2008. Given her known PAD, he suggested she would require a radial approach. Of note, patient is scheduled to see Dr. Fabienne Bruns next week, and is scheduled for surveillance carotid Dopplers as well as ABIs.  Unfortunately, she is a very difficult historian. It is not clear if her symptoms have improved, since her last OV. She suggest symptoms occurring both at rest and with ambulation. There is some suggestion of progression over the past 2-3 months. She also complains of CP during "stress", followed by intense headaches.    Allergies  Allergen Reactions  . Penicillins Anaphylaxis  . Sulfonamide Derivatives Anaphylaxis  . Alupent (Metaproterenol Sulfate) Other (See Comments)    Makes pt "nervous"  . Azithromycin Other (See Comments)    unknown  . Omnipaque (Iohexol) Other (See Comments)    unknown  . Paroxetine Other (See Comments)    unknown  . Varenicline Tartrate Other (See Comments)    unknown  . Benadryl (Diphenhydramine Hcl) Other (See Comments)    Can't breath  . Hydrocortisone Hives  . Prednisone Hives    Current Outpatient Prescriptions  Medication Sig Dispense Refill  . albuterol (2.5 MG/3ML) 0.083% NEBU 3 mL, albuterol (5 MG/ML) 0.5% NEBU 0.5 mL Inhale 2.5 mg into the lungs every 6 (six) hours.      Marland Kitchen albuterol (VENTOLIN HFA) 108 (90 BASE) MCG/ACT inhaler Inhale 2 puffs into the lungs every 6 (six) hours as needed.      . ALPRAZolam (XANAX) 1 MG tablet Take 1 mg by mouth 4 (four) times daily as needed. For nerves      . aspirin 81 MG tablet Take 81 mg by mouth daily.        . bisacodyl  (DULCOLAX) 5 MG EC tablet Take 20 mg by mouth daily as needed. For bowel movement.       . furosemide (LASIX) 20 MG tablet Take 20 mg by mouth as needed. For swelling.      Marland Kitchen HYDROcodone-acetaminophen (NORCO) 7.5-325 MG per tablet Take 1 tablet by mouth every 6 (six) hours as needed. For pain.      . isosorbide mononitrate (IMDUR) 60 MG 24 hr tablet Take 1 tablet (60 mg total) by mouth every morning.  90 tablet  2  . lactose free nutrition (BOOST) LIQD Take 1 Container by mouth daily. Calorie Smart      . metFORMIN (GLUCOPHAGE) 500 MG tablet Take 1,000 mg by mouth 2 (two) times daily with a meal.        . metoprolol succinate (TOPROL-XL) 25 MG 24 hr tablet Take 12.5 mg by mouth every morning.       . nitroGLYCERIN (NITROSTAT) 0.4 MG SL tablet Place 0.4 mg under the tongue every 5 (five) minutes as needed. For chest pain.      . potassium chloride (KLOR-CON) 10 MEQ CR tablet Take 10 mEq by mouth as needed. When taking lasix.      Marland Kitchen simvastatin (ZOCOR) 40 MG tablet Take 40 mg by mouth at bedtime.          Past Medical History  Diagnosis Date  . Diabetes mellitus   .  Coronary artery disease     Severe single-vessel/100% occluded right with distal collateralization.Medical therapy/Normal LV fx  . Carotid bruit     Bilateral  . Sinus bradycardia   . Dyslipidemia   . DVT (deep venous thrombosis)     Remote portable as him   . COPD (chronic obstructive pulmonary disease)   . Arthritis   . Depression with anxiety   . Leg pain   . Productive cough   . Peripheral arterial disease   . Carotid artery occlusion     Past Surgical History  Procedure Date  . Parotidectomy     Left  . Shoulder blade     Cysts x 2  . Tonsillectomy and adenoidectomy   . Partial hysterectomy     Subsequent completion later that year,hx of large fibroid tumor  . Appendectomy   . Aortogram     with bilateral lower extremity run-off  . Abdominal hysterectomy     History   Social History  . Marital Status:  Widowed    Spouse Name: N/A    Number of Children: N/A  . Years of Education: N/A   Occupational History  . Not on file.   Social History Main Topics  . Smoking status: Current Everyday Smoker -- 1.0 packs/day for 52 years    Types: Cigarettes  . Smokeless tobacco: Never Used  . Alcohol Use: No  . Drug Use: Not on file  . Sexually Active: Not on file   Other Topics Concern  . Not on file   Social History Narrative  . No narrative on file    Family History  Problem Relation Age of Onset  . Cancer Maternal Grandmother     liver  . Cancer Maternal Grandfather     lung    ROS: no nausea, vomiting; no fever, chills; no melena, hematochezia; no claudication  PHYSICAL EXAM: BP 122/63  Pulse 69  Ht 5\' 4"  (1.626 m)  Wt 121 lb (54.885 kg)  BMI 20.77 kg/m2 GENERAL: 66 year-old female; NAD HEENT: NCAT, PERRLA, EOMI; sclera clear; no xanthelasma NECK: bilateral carotid bruits; no JVD; no TM LUNGS: CTA bilaterally CARDIAC: RRR (S1, S2); soft, grade 2/6 systolic murmur; no rubs or gallops ABDOMEN: soft, non-tender; intact BS EXTREMETIES: intact distal pulses; no significant peripheral edema SKIN: warm/dry; no obvious rash/lesions MUSCULOSKELETAL: no joint deformity NEURO: no focal deficit; NL affect   EKG:    ASSESSMENT & PLAN:  Patient seen and examined with Gene Rufus Beske, PA-C.  Counseling was provided regarding the current medical condition and included: . Diagnosis, impressions, prognosis, recommended diagnostic studies  . Risks and benefits of treatment options  . Instructions for management, treatment and/or follow-up care  . Importance of compliance with treatment, risk factor reduction  . Patient and/or family education    Time spent counseling was 15 minutes and recorded in the Problem List documeted by Gene Milicent Acheampong , PA-C   Alvin Critchley [facility] 04/10/2012 12:59 PM

## 2012-04-12 ENCOUNTER — Encounter: Payer: Self-pay | Admitting: Vascular Surgery

## 2012-04-13 ENCOUNTER — Other Ambulatory Visit (INDEPENDENT_AMBULATORY_CARE_PROVIDER_SITE_OTHER): Payer: Medicare Other | Admitting: *Deleted

## 2012-04-13 ENCOUNTER — Other Ambulatory Visit: Payer: Medicare Other

## 2012-04-13 ENCOUNTER — Ambulatory Visit: Payer: Medicare Other | Admitting: Vascular Surgery

## 2012-04-13 ENCOUNTER — Ambulatory Visit (INDEPENDENT_AMBULATORY_CARE_PROVIDER_SITE_OTHER): Payer: Medicare Other | Admitting: Vascular Surgery

## 2012-04-13 ENCOUNTER — Encounter (INDEPENDENT_AMBULATORY_CARE_PROVIDER_SITE_OTHER): Payer: Medicare Other | Admitting: *Deleted

## 2012-04-13 ENCOUNTER — Encounter: Payer: Self-pay | Admitting: Vascular Surgery

## 2012-04-13 VITALS — BP 114/61 | HR 51 | Temp 97.1°F | Ht 64.0 in | Wt 121.6 lb

## 2012-04-13 DIAGNOSIS — I6529 Occlusion and stenosis of unspecified carotid artery: Secondary | ICD-10-CM

## 2012-04-13 DIAGNOSIS — Z48812 Encounter for surgical aftercare following surgery on the circulatory system: Secondary | ICD-10-CM

## 2012-04-13 DIAGNOSIS — I739 Peripheral vascular disease, unspecified: Secondary | ICD-10-CM

## 2012-04-13 DIAGNOSIS — I7092 Chronic total occlusion of artery of the extremities: Secondary | ICD-10-CM
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
## 2012-04-21 ENCOUNTER — Encounter: Payer: Self-pay | Admitting: Physician Assistant

## 2012-04-21 ENCOUNTER — Ambulatory Visit (INDEPENDENT_AMBULATORY_CARE_PROVIDER_SITE_OTHER): Payer: Medicare Other | Admitting: Physician Assistant

## 2012-04-21 VITALS — BP 101/64 | HR 73 | Ht 64.0 in | Wt 119.4 lb

## 2012-04-21 DIAGNOSIS — I2510 Atherosclerotic heart disease of native coronary artery without angina pectoris: Secondary | ICD-10-CM

## 2012-04-21 NOTE — Patient Instructions (Signed)
Your physician recommends that you schedule a follow-up appointment in: 1 month. You will be given this appointment at the check out desk.  Your physician has recommended you make the following change in your medication: STOP AMLODIPINE OR NORVASC STOP ATORVASTATIN OR LIPITOR  NO STATIN UNTIL SEEN IN FOLLOW UP.

## 2012-04-21 NOTE — Progress Notes (Signed)
HPI: Patient returns for early scheduled followup.  When last seen, patient opted for initial medical therapy versus proceeding with diagnostic cardiac catheterization, to rule out significant disease progression since 2008. I added amlodipine and substituted simvastatin with Lipitor, so as to avoid possible adverse interaction. NTG prescription was refilled.  Patient presents today quite distraught and tearful. She complains of generalized muscle ache and joint pain. She has also documented significantly increased serum glucose levels, since last OV. She feels that this is all due to the recent medication adjustments, where I substituted simvastatin with Lipitor and added amlodipine.  With regards to chest pain, she still has CP with/without activity or exertion, and also cites ongoing "stress", which appears significant.   Allergies  Allergen Reactions  . Penicillins Anaphylaxis  . Sulfonamide Derivatives Anaphylaxis  . Alupent (Metaproterenol Sulfate) Other (See Comments)    Makes pt "nervous"  . Azithromycin Other (See Comments)    unknown  . Omnipaque (Iohexol) Other (See Comments)    unknown  . Paroxetine Other (See Comments)    unknown  . Varenicline Tartrate Other (See Comments)    unknown  . Benadryl (Diphenhydramine Hcl) Other (See Comments)    Can't breath  . Hydrocortisone Hives  . Prednisone Hives    Current Outpatient Prescriptions  Medication Sig Dispense Refill  . albuterol (2.5 MG/3ML) 0.083% NEBU 3 mL, albuterol (5 MG/ML) 0.5% NEBU 0.5 mL Inhale 2.5 mg into the lungs every 6 (six) hours.      Marland Kitchen albuterol (VENTOLIN HFA) 108 (90 BASE) MCG/ACT inhaler Inhale 2 puffs into the lungs every 6 (six) hours as needed.      . ALPRAZolam (XANAX) 1 MG tablet Take 1 mg by mouth 4 (four) times daily as needed. For nerves      . aspirin 81 MG tablet Take 81 mg by mouth daily.        . bisacodyl (DULCOLAX) 5 MG EC tablet Take 20 mg by mouth daily as needed. For bowel movement.       . furosemide (LASIX) 20 MG tablet Take 20 mg by mouth as needed. For swelling.      Marland Kitchen HYDROcodone-acetaminophen (NORCO) 7.5-325 MG per tablet Take 1 tablet by mouth every 6 (six) hours as needed. For pain.      . isosorbide mononitrate (IMDUR) 60 MG 24 hr tablet Take 1 tablet (60 mg total) by mouth every morning.  90 tablet  2  . lactose free nutrition (BOOST) LIQD Take 1 Container by mouth daily. Calorie Smart      . metFORMIN (GLUCOPHAGE) 500 MG tablet Take 1,000 mg by mouth 2 (two) times daily with a meal.        . metoprolol succinate (TOPROL-XL) 25 MG 24 hr tablet Take 12.5 mg by mouth every morning.       . nitroGLYCERIN (NITROSTAT) 0.4 MG SL tablet Place 1 tablet (0.4 mg total) under the tongue every 5 (five) minutes as needed. For chest pain.  25 tablet  3  . potassium chloride (KLOR-CON) 10 MEQ CR tablet Take 10 mEq by mouth as needed. When taking lasix.        Past Medical History  Diagnosis Date  . Diabetes mellitus   . Coronary artery disease     Severe single-vessel/100% occluded right with distal collateralization.Medical therapy/Normal LV fx  . Carotid bruit     Bilateral  . Sinus bradycardia   . Dyslipidemia   . DVT (deep venous thrombosis)     Remote portable  as him   . COPD (chronic obstructive pulmonary disease)   . Arthritis   . Depression with anxiety   . Leg pain   . Productive cough   . Peripheral arterial disease   . Carotid artery occlusion     Past Surgical History  Procedure Date  . Parotidectomy     Left  . Shoulder blade     Cysts x 2  . Tonsillectomy and adenoidectomy   . Partial hysterectomy     Subsequent completion later that year,hx of large fibroid tumor  . Appendectomy   . Aortogram     with bilateral lower extremity run-off  . Abdominal hysterectomy     History   Social History  . Marital Status: Widowed    Spouse Name: N/A    Number of Children: N/A  . Years of Education: N/A   Occupational History  . Not on file.    Social History Main Topics  . Smoking status: Current Everyday Smoker -- 1.0 packs/day for 52 years    Types: Cigarettes  . Smokeless tobacco: Never Used   Comment: pt states she will be starting patches today  . Alcohol Use: No  . Drug Use: No  . Sexually Active: Not on file   Other Topics Concern  . Not on file   Social History Narrative  . No narrative on file   Social History Narrative  . No narrative on file    Problem Relation Age of Onset  . Cancer Maternal Grandmother     liver  . Cancer Maternal Grandfather     lung  . Diabetes Mother   . Diabetes Son     ROS: no nausea, vomiting; no fever, chills; no melena, hematochezia; no claudication  PHYSICAL EXAM: BP 101/64  Pulse 73  Ht 5\' 4"  (1.626 m)  Wt 119 lb 6.4 oz (54.159 kg)  BMI 20.49 kg/m2 GENERAL: 66 year-old female; NAD  HEENT: NCAT, PERRLA, EOMI; sclera clear; no xanthelasma  NECK: bilateral carotid bruits; no JVD; no TM  LUNGS: CTA bilaterally  CARDIAC: RRR (S1, S2); soft, grade 2/6 systolic murmur; no rubs or gallops  ABDOMEN: soft, non-tender; intact BS  EXTREMETIES: intact distal pulses; no significant peripheral edema  SKIN: warm/dry; no obvious rash/lesions  MUSCULOSKELETAL: no joint deformity  NEURO: no focal deficit; NL affect    EKG:    ASSESSMENT & PLAN:  CAD Patient is to distraught currently to provide any clear, coherent history, much less discuss further evaluation with an elective cardiac catheterization. She is complaining of generalized muscle ache, as well as joint pain, which may be due to the Lipitor which I recently placed her on. She is also concerned that her serum glucose levels have risen dramatically, following our recent medication adjustments. Therefore, will stop both Lipitor and amlodipine, and also not resume previous dose simvastatin, until she returns to me in followup, in the next several weeks.     Gene Alvenia Treese, PAC

## 2012-04-21 NOTE — Assessment & Plan Note (Signed)
Patient is to distraught currently to provide any clear, coherent history, much less discuss further evaluation with an elective cardiac catheterization. She is complaining of generalized muscle ache, as well as joint pain, which may be due to the Lipitor which I recently placed her on. She is also concerned that her serum glucose levels have risen dramatically, following our recent medication adjustments. Therefore, will stop both Lipitor and amlodipine, and also not resume previous dose simvastatin, until she returns to me in followup, in the next several weeks.

## 2012-04-25 ENCOUNTER — Telehealth: Payer: Self-pay | Admitting: *Deleted

## 2012-04-25 NOTE — Telephone Encounter (Signed)
Message left on voice mail - question if they do heart cath at [facility] or [facility].  Returned call - informed patient that neither of the hospitals above do that type of procedure.  Would have to go to [facility] & that will be looked at again when she comes back for follow up 6/27.    Patient also c/o red spots popping up on her ankles.  Advised her to see PMD [name]) about this as she does not report edema in that area.  She verbalized understanding.

## 2012-05-04 ENCOUNTER — Ambulatory Visit: Payer: Medicare Other | Admitting: Vascular Surgery

## 2012-05-04 ENCOUNTER — Other Ambulatory Visit: Payer: Medicare Other

## 2012-05-25 ENCOUNTER — Encounter: Payer: Self-pay | Admitting: Physician Assistant

## 2012-05-25 ENCOUNTER — Ambulatory Visit (INDEPENDENT_AMBULATORY_CARE_PROVIDER_SITE_OTHER): Payer: Medicare Other | Admitting: Physician Assistant

## 2012-05-25 ENCOUNTER — Telehealth: Payer: Self-pay | Admitting: *Deleted

## 2012-05-25 VITALS — BP 107/58 | HR 62 | Ht 64.0 in | Wt 120.0 lb

## 2012-05-25 DIAGNOSIS — E119 Type 2 diabetes mellitus without complications: Secondary | ICD-10-CM

## 2012-05-25 DIAGNOSIS — E7800 Pure hypercholesterolemia, unspecified: Secondary | ICD-10-CM

## 2012-05-25 DIAGNOSIS — I2510 Atherosclerotic heart disease of native coronary artery without angina pectoris: Secondary | ICD-10-CM

## 2012-05-25 MED ORDER — SIMVASTATIN 40 MG PO TABS: 40.0000 mg | ORAL_TABLET | Freq: Every evening | ORAL | Status: DC

## 2012-05-25 NOTE — Telephone Encounter (Signed)
Patient called to give pharmacy information for mail order pharmacy. Mail prescriptions to the following address or fax to : [facility] [address] [place], [place] [address] Or fax [phone] Phone [phone]

## 2012-05-25 NOTE — Assessment & Plan Note (Signed)
No further workup currently indicated. Patient has significant anxiety disorder, which has been exacerbated recently by her documentation of elevated blood glucose levels. She attributes this to having been placed on Imdur. Therefore, we will discontinue this medication today. She will thus be on a simplified regimen of low-dose ASA, low-dose Toprol, and a statin. We will schedule her to return in 3 months, to resume followup with Dr. Andee Lineman.

## 2012-05-25 NOTE — Progress Notes (Signed)
HPI: Patient presents for early scheduled followup.  Patient remains distraught and concerned that her blood glucose levels are uncontrolled. She attributes this to being placed on Imdur. Also, despite the fact that I stopped her Lipitor at last OV, she continues to complain of "cramps" in her legs. She does not take Lasix daily, and attributes this to her PAD. She continues to complain of "weakness", which he feels has worsened since last OV. However, she denies any exertional CP. Her salient complaint remains that of uncontrolled blood glucose levels, which has been a source of significant anxiety for her.  Allergies  Allergen Reactions  . Penicillins Anaphylaxis  . Sulfonamide Derivatives Anaphylaxis  . Alupent (Metaproterenol Sulfate) Other (See Comments)    Makes pt "nervous"  . Azithromycin Other (See Comments)    unknown  . Omnipaque (Iohexol) Other (See Comments)    unknown  . Paroxetine Other (See Comments)    unknown  . Varenicline Tartrate Other (See Comments)    unknown  . Benadryl (Diphenhydramine Hcl) Other (See Comments)    Can't breath  . Hydrocortisone Hives  . Prednisone Hives    Current Outpatient Prescriptions  Medication Sig Dispense Refill  . albuterol (2.5 MG/3ML) 0.083% NEBU 3 mL, albuterol (5 MG/ML) 0.5% NEBU 0.5 mL Inhale 2.5 mg into the lungs every 6 (six) hours as needed.       Marland Kitchen albuterol (VENTOLIN HFA) 108 (90 BASE) MCG/ACT inhaler Inhale 2 puffs into the lungs every 6 (six) hours as needed.      . ALPRAZolam (XANAX) 1 MG tablet Take 1 mg by mouth 4 (four) times daily as needed. For nerves      . aspirin 81 MG tablet Take 81 mg by mouth daily.        . bisacodyl (DULCOLAX) 5 MG EC tablet Take 20 mg by mouth daily as needed. For bowel movement.      . furosemide (LASIX) 20 MG tablet Take 20 mg by mouth as needed. For swelling.      Marland Kitchen HYDROcodone-acetaminophen (NORCO) 7.5-325 MG per tablet Take 1 tablet by mouth every 6 (six) hours as needed. For pain.        Marland Kitchen lactose free nutrition (BOOST) LIQD Take 1 Container by mouth daily. Calorie Smart      . metFORMIN (GLUCOPHAGE) 500 MG tablet Take 1,000 mg by mouth 2 (two) times daily with a meal.        . metoprolol succinate (TOPROL-XL) 25 MG 24 hr tablet Take 12.5 mg by mouth every morning.       . nitroGLYCERIN (NITROSTAT) 0.4 MG SL tablet Place 1 tablet (0.4 mg total) under the tongue every 5 (five) minutes as needed. For chest pain.  25 tablet  3  . potassium chloride (KLOR-CON) 10 MEQ CR tablet Take 10 mEq by mouth as needed. When taking lasix.      Marland Kitchen simvastatin (ZOCOR) 40 MG tablet Take 1 tablet (40 mg total) by mouth every evening.  90 tablet  3    Past Medical History  Diagnosis Date  . Diabetes mellitus   . Coronary artery disease     Severe single-vessel/100% occluded right with distal collateralization.Medical therapy/Normal LV fx  . Carotid bruit     Bilateral  . Sinus bradycardia   . Dyslipidemia   . DVT (deep venous thrombosis)     Remote portable as him   . COPD (chronic obstructive pulmonary disease)   . Arthritis   . Depression with anxiety   .  Leg pain   . Productive cough   . Peripheral arterial disease   . Carotid artery occlusion     Past Surgical History  Procedure Date  . Parotidectomy     Left  . Shoulder blade     Cysts x 2  . Tonsillectomy and adenoidectomy   . Partial hysterectomy     Subsequent completion later that year,hx of large fibroid tumor  . Appendectomy   . Aortogram     with bilateral lower extremity run-off  . Abdominal hysterectomy     History   Social History  . Marital Status: Widowed    Spouse Name: N/A    Number of Children: N/A  . Years of Education: N/A   Occupational History  . Not on file.   Social History Main Topics  . Smoking status: Current Everyday Smoker -- 1.0 packs/day for 52 years    Types: Cigarettes  . Smokeless tobacco: Never Used   Comment: pt states she will be starting patches today  . Alcohol Use: No   . Drug Use: No  . Sexually Active: Not on file   Other Topics Concern  . Not on file   Social History Narrative  . No narrative on file    Family History  Problem Relation Age of Onset  . Cancer Maternal Grandmother     liver  . Cancer Maternal Grandfather     lung  . Diabetes Mother   . Diabetes Son     ROS: no nausea, vomiting; no fever, chills; no melena, hematochezia; no claudication  PHYSICAL EXAM: BP 107/58  Pulse 62  Ht 5\' 4"  (1.626 m)  Wt 120 lb (54.432 kg)  BMI 20.60 kg/m2 GENERAL: 66 year-old female; NAD  HEENT: NCAT, PERRLA, EOMI; sclera clear; no xanthelasma  NECK: bilateral carotid bruits; no JVD; no TM  LUNGS: CTA bilaterally  CARDIAC: RRR (S1, S2); soft, grade 2/6 systolic murmur; no rubs or gallops  ABDOMEN: soft, non-tender; intact BS  EXTREMETIES: intact distal pulses; no significant peripheral edema  SKIN: warm/dry; no obvious rash/lesions  MUSCULOSKELETAL: no joint deformity  NEURO: no focal deficit; NL affect    EKG:    ASSESSMENT & PLAN:  CAD No further workup currently indicated. Patient has significant anxiety disorder, which has been exacerbated recently by her documentation of elevated blood glucose levels. She attributes this to having been placed on Imdur. Therefore, we will discontinue this medication today. She will thus be on a simplified regimen of low-dose ASA, low-dose Toprol, and a statin. We will schedule her to return in 3 months, to resume followup with Dr. Andee Lineman.  DM Followed by Dr. [name]. Patient is convinced that after being placed on Imdur, her blood glucose levels have reached new highs. As noted, we will discontinue this medication, and she is to continue close follow up with her primary physician.  Hypercholesterolemia Per her request, I will resume simvastatin at 40 mg daily. I stopped Lipitor one month ago. She is due to have surveillance labs, including lipid profile, with Dr. [name] in the next few weeks.  We'll request copies of his lab results, at time of next OV. She will continue to need aggressive lipid management with target LDL 70 or less, if feasible.    Gene Inocente Krach, PAC

## 2012-05-25 NOTE — Patient Instructions (Addendum)
Your physician recommends that you schedule a follow-up appointment in: 3 months with Dr. Andee Lineman.  Your physician has recommended you make the following change in your medication: stop isosorbide mononitrate(imdur). Start simvastatin 40 mg one at bedtime. You have been given a prescription to send to your mail order pharmacy and we sent a 30 day supply prescription to [facility]. All other medications will remain the same.

## 2012-05-25 NOTE — Assessment & Plan Note (Signed)
Per her request, I will resume simvastatin at 40 mg daily. I stopped Lipitor one month ago. She is due to have surveillance labs, including lipid profile, with Dr. [name] in the next few weeks. We'll request copies of his lab results, at time of next OV. She will continue to need aggressive lipid management with target LDL 70 or less, if feasible.

## 2012-05-25 NOTE — Assessment & Plan Note (Signed)
Followed by Dr. [name]. Patient is convinced that after being placed on Imdur, her blood glucose levels have reached new highs. As noted, we will discontinue this medication, and she is to continue close follow up with her primary physician.

## 2012-06-13 ENCOUNTER — Ambulatory Visit (HOSPITAL_COMMUNITY)
Admission: RE | Admit: 2012-06-13 | Discharge: 2012-06-13 | Disposition: A | Discharge status: Home / Self Care | Payer: Medicare Other | Source: Ambulatory Visit | Attending: Pulmonary Disease | Admitting: Pulmonary Disease

## 2012-06-13 ENCOUNTER — Other Ambulatory Visit (HOSPITAL_COMMUNITY): Payer: Self-pay | Admitting: Pulmonary Disease

## 2012-06-13 DIAGNOSIS — J449 Chronic obstructive pulmonary disease, unspecified: Secondary | ICD-10-CM | POA: Insufficient documentation

## 2012-06-13 DIAGNOSIS — R0602 Shortness of breath: Secondary | ICD-10-CM | POA: Insufficient documentation

## 2012-06-13 DIAGNOSIS — F17200 Nicotine dependence, unspecified, uncomplicated: Secondary | ICD-10-CM | POA: Insufficient documentation

## 2012-06-13 DIAGNOSIS — R05 Cough: Secondary | ICD-10-CM

## 2012-06-13 DIAGNOSIS — R059 Cough, unspecified: Secondary | ICD-10-CM | POA: Insufficient documentation

## 2012-06-13 DIAGNOSIS — J4489 Other specified chronic obstructive pulmonary disease: Secondary | ICD-10-CM | POA: Insufficient documentation

## 2012-06-21 ENCOUNTER — Telehealth: Payer: Self-pay | Admitting: Cardiology

## 2012-06-21 NOTE — Telephone Encounter (Signed)
Patient called in and complained of swelling of her legs and some fluid retention; and said that she wanted to inform us that she had started taking the lasix.

## 2012-07-12 ENCOUNTER — Other Ambulatory Visit (HOSPITAL_COMMUNITY): Payer: Self-pay | Admitting: Pulmonary Disease

## 2012-07-12 DIAGNOSIS — M79606 Pain in leg, unspecified: Secondary | ICD-10-CM

## 2012-07-13 ENCOUNTER — Ambulatory Visit (HOSPITAL_COMMUNITY)
Admission: RE | Admit: 2012-07-13 | Discharge: 2012-07-13 | Disposition: A | Discharge status: Home / Self Care | Payer: Medicare Other | Source: Ambulatory Visit | Attending: Pulmonary Disease | Admitting: Pulmonary Disease

## 2012-07-13 DIAGNOSIS — M79606 Pain in leg, unspecified: Secondary | ICD-10-CM

## 2012-07-13 DIAGNOSIS — Z86718 Personal history of other venous thrombosis and embolism: Secondary | ICD-10-CM | POA: Insufficient documentation

## 2012-07-13 DIAGNOSIS — M79609 Pain in unspecified limb: Secondary | ICD-10-CM | POA: Insufficient documentation

## 2012-07-24 ENCOUNTER — Other Ambulatory Visit (HOSPITAL_COMMUNITY): Payer: Self-pay | Admitting: Pulmonary Disease

## 2012-07-24 DIAGNOSIS — R29890 Loss of height: Secondary | ICD-10-CM

## 2012-07-26 NOTE — Progress Notes (Signed)
Patient ID: EZRI FANGUY, female   DOB: [birth year], 66 y.o.   MRN: [id number] Dr.Hawkins's office sent over a referral for this Pt and she has been here in the past but she would like to see Rehman. I called Hawkins's office and made them aware.

## 2012-07-28 ENCOUNTER — Ambulatory Visit (INDEPENDENT_AMBULATORY_CARE_PROVIDER_SITE_OTHER): Payer: Medicare Other | Admitting: Cardiology

## 2012-07-28 ENCOUNTER — Other Ambulatory Visit (HOSPITAL_COMMUNITY): Payer: Medicare Other

## 2012-07-28 ENCOUNTER — Encounter: Payer: Self-pay | Admitting: Cardiology

## 2012-07-28 VITALS — BP 115/66 | HR 64 | Ht 64.0 in | Wt 121.0 lb

## 2012-07-28 DIAGNOSIS — E7800 Pure hypercholesterolemia, unspecified: Secondary | ICD-10-CM

## 2012-07-28 DIAGNOSIS — I6529 Occlusion and stenosis of unspecified carotid artery: Secondary | ICD-10-CM

## 2012-07-28 DIAGNOSIS — R079 Chest pain, unspecified: Secondary | ICD-10-CM

## 2012-07-28 DIAGNOSIS — I82409 Acute embolism and thrombosis of unspecified deep veins of unspecified lower extremity: Secondary | ICD-10-CM

## 2012-07-28 DIAGNOSIS — I2510 Atherosclerotic heart disease of native coronary artery without angina pectoris: Secondary | ICD-10-CM

## 2012-07-28 DIAGNOSIS — I7092 Chronic total occlusion of artery of the extremities: Secondary | ICD-10-CM

## 2012-07-28 MED ORDER — TRAZODONE HCL 100 MG PO TABS: 100.0000 mg | ORAL_TABLET | Freq: Every day | ORAL | Status: DC

## 2012-07-28 MED ORDER — CLONAZEPAM 0.5 MG PO TBDP: 0.5000 mg | ORAL_TABLET | Freq: Two times a day (BID) | ORAL | Status: DC

## 2012-07-28 MED ORDER — CLONAZEPAM 0.5 MG PO TABS: 0.5000 mg | ORAL_TABLET | Freq: Two times a day (BID) | ORAL | Status: DC

## 2012-07-28 NOTE — Patient Instructions (Addendum)
   Clonazepam 0.5mg  twice a day    Trazodone 100mg  every evening as needed for sleep (90 day supply faxed to [facility] also)  Compression stockings  07/28/2012 - 5:16 - Dr. Andee Lineman is aware that patient was on Xanax also.  GD intended for pt to take Xanax 1/2 tab as needed only for panic attack with intent of weaning off Xanax all together.  Informed [facility] of this so they will fill her Clonazepam this evening.  Patient aware.

## 2012-08-01 ENCOUNTER — Ambulatory Visit (HOSPITAL_COMMUNITY)
Admission: RE | Admit: 2012-08-01 | Discharge: 2012-08-01 | Disposition: A | Discharge status: Home / Self Care | Payer: Medicare Other | Source: Ambulatory Visit | Attending: Pulmonary Disease | Admitting: Pulmonary Disease

## 2012-08-01 DIAGNOSIS — M899 Disorder of bone, unspecified: Secondary | ICD-10-CM | POA: Insufficient documentation

## 2012-08-01 DIAGNOSIS — M949 Disorder of cartilage, unspecified: Secondary | ICD-10-CM | POA: Insufficient documentation

## 2012-08-01 DIAGNOSIS — R29890 Loss of height: Secondary | ICD-10-CM

## 2012-08-04 NOTE — Assessment & Plan Note (Signed)
Lower extremity edema likely secondary to venous insufficiency and some degree of the postphlebitic syndrome.  I recommend compression stockings.  No evidence of heart failure.

## 2012-08-04 NOTE — Assessment & Plan Note (Signed)
As outlined above followed by Dr. Darrick Penna.

## 2012-08-04 NOTE — Assessment & Plan Note (Signed)
Followed by her primary care physician 

## 2012-08-04 NOTE — Assessment & Plan Note (Signed)
Followup regarding carotid artery disease and lower extremity claudication or vascular surgery.  Continue risk factor modification.

## 2012-08-04 NOTE — Progress Notes (Signed)
Melissa Bottoms, MD, [facility] ABIM Board Certified in Adult Cardiovascular Medicine,Internal Medicine and Critical Care Medicine    CC: followup patient with coronary artery disease                                                                                 HPI:        The patient currently reports no unstable symptoms of chest pain.  She has no orthopnea or PND.  She does continue to have significant social anxiety.  Sometimes is associated with brief episodes of memory loss.  When she bends over she also experienced some throbbing in the left neck.  As well as shooting pain in the temple.  From a cardiac standpoint however she reports no palpitations presyncope or syncope. She has known carotid artery disease and peripheral vascular disease which is closely followed by Dr. Darrick Penna.  The patient also was counseled regarding her tobacco use.  PMH: reviewed and listed in Problem List in Electronic Records (and see below) Past Medical History  Diagnosis Date  . Diabetes mellitus   . Coronary artery disease     Severe single-vessel/100% occluded right with distal collateralization.Medical therapy/Normal LV fx  . Carotid bruit     Bilateral  . Sinus bradycardia   . Dyslipidemia   . DVT (deep venous thrombosis)     Remote portable as him   . COPD (chronic obstructive pulmonary disease)   . Arthritis   . Depression with anxiety   . Leg pain   . Productive cough   . Peripheral arterial disease   . Carotid artery occlusion    Past Surgical History  Procedure Date  . Parotidectomy     Left  . Shoulder blade     Cysts x 2  . Tonsillectomy and adenoidectomy   . Partial hysterectomy     Subsequent completion later that year,hx of large fibroid tumor  . Appendectomy   . Aortogram     with bilateral lower extremity run-off  . Abdominal hysterectomy     Allergies/SH/FHX : available in Electronic Records for review  Allergies  Allergen Reactions  . Penicillins Anaphylaxis  .  Sulfonamide Derivatives Anaphylaxis  . Alupent (Metaproterenol Sulfate) Other (See Comments)    Makes pt "nervous"  . Azithromycin Other (See Comments)    unknown  . Omnipaque (Iohexol) Other (See Comments)    unknown  . Paroxetine Other (See Comments)    unknown  . Varenicline Tartrate Other (See Comments)    unknown  . Benadryl (Diphenhydramine Hcl) Other (See Comments)    Can't breath  . Hydrocortisone Hives  . Prednisone Hives   History   Social History  . Marital Status: Widowed    Spouse Name: N/A    Number of Children: N/A  . Years of Education: N/A   Occupational History  . Not on file.   Social History Main Topics  . Smoking status: Current Everyday Smoker -- 1.0 packs/day for 52 years    Types: Cigarettes  . Smokeless tobacco: Never Used   Comment: pt states she will be starting patches today  . Alcohol Use: No  . Drug Use: No  . Sexually  Active: Not on file   Other Topics Concern  . Not on file   Social History Narrative  . No narrative on file   Family History  Problem Relation Age of Onset  . Cancer Maternal Grandmother     liver  . Cancer Maternal Grandfather     lung  . Diabetes Mother   . Diabetes Son     Medications: Current Outpatient Prescriptions  Medication Sig Dispense Refill  . albuterol (2.5 MG/3ML) 0.083% NEBU 3 mL, albuterol (5 MG/ML) 0.5% NEBU 0.5 mL Inhale 2.5 mg into the lungs every 6 (six) hours as needed.       Marland Kitchen albuterol (VENTOLIN HFA) 108 (90 BASE) MCG/ACT inhaler Inhale 2 puffs into the lungs every 6 (six) hours as needed.      . ALPRAZolam (XANAX) 1 MG tablet Take 1 mg by mouth 4 (four) times daily as needed. For nerves      . aspirin 81 MG tablet Take 81 mg by mouth daily.        . bisacodyl (DULCOLAX) 5 MG EC tablet Take 20 mg by mouth daily as needed. For bowel movement.      Marland Kitchen HYDROcodone-acetaminophen (NORCO) 7.5-325 MG per tablet Take 1 tablet by mouth every 6 (six) hours as needed. For pain.      Marland Kitchen lactose free  nutrition (BOOST) LIQD Take 1 Container by mouth daily. Calorie Smart      . metFORMIN (GLUCOPHAGE) 500 MG tablet Take 1,000 mg by mouth 2 (two) times daily with a meal.        . metoprolol succinate (TOPROL-XL) 25 MG 24 hr tablet Take 12.5 mg by mouth every morning.       . nitroGLYCERIN (NITROSTAT) 0.4 MG SL tablet Place 1 tablet (0.4 mg total) under the tongue every 5 (five) minutes as needed. For chest pain.  25 tablet  3  . potassium chloride (KLOR-CON) 10 MEQ CR tablet Take 10 mEq by mouth as needed. When taking torsemide      . simvastatin (ZOCOR) 40 MG tablet Take 1 tablet (40 mg total) by mouth every evening.  90 tablet  3  . torsemide (DEMADEX) 100 MG tablet Take 50 mg by mouth daily as needed.      . clonazePAM (KLONOPIN) 0.5 MG tablet Take 1 tablet (0.5 mg total) by mouth 2 (two) times daily.  60 tablet  1  . traZODone (DESYREL) 100 MG tablet Take 1 tablet (100 mg total) by mouth at bedtime.  90 tablet  0  . traZODone (DESYREL) 100 MG tablet Take 1 tablet (100 mg total) by mouth at bedtime.  30 tablet  0    ROS: No nausea or vomiting. No fever or chills.No melena or hematochezia.No bleeding.No claudication  Physical Exam: BP 115/66  Pulse 64  Ht 5\' 4"  (1.626 m)  Wt 121 lb (54.885 kg)  BMI 20.77 kg/m2 General:well-nourished. Neck:normal carotid upstroke with bilateral carotid bruits Lungs:history of breast bilaterally without wheezing Cardiac:regular rate and rhythm with normal S1 and S2 no pathological murmurs Vascular:no edema.  Unable to palpate dorsalis pedis and posterior tibial pulses bilaterally Skin:warm and dry Physcologic:normal affect  12lead [id number] sinus rhythm with right bundle branch block Limited bedside ECHO:N/A No images are attached to the encounter.   I reviewed and summarized the old records. I reviewed ECG and prior blood work.  Assessment and Plan  CAROTID ARTERY DISEASE Followup regarding carotid artery disease and lower extremity  claudication or vascular surgery.  Continue  risk factor modification.  Chronic total occlusion of artery of the extremities As outlined above followed by Dr. Darrick Penna.  CAD Known coronary artery disease but currently no chest pain.  Continue risk factor modification  Hypercholesterolemia Followed by her primary care physician.  DEEP VENOUS THROMBOPHLEBITIS, CHRONIC Lower extremity edema likely secondary to venous insufficiency and some degree of the postphlebitic syndrome.  I recommend compression stockings.  No evidence of heart failure.  Anxiety and insomnia Patient continues to have significant anxiety and asked her to cut down on Xanax and try to stay on a low dose of clonazepam and only uses Xanax just severe panic attacks.  I told her about highly addictive potential of Xanax.  She also has severe insomnia and have given her a prescription for trazodone.    Patient Active Problem List  Diagnosis  . DM  . CAD  . CAROTID ARTERY DISEASE  . PVD WITH CLAUDICATION  . DEEP VENOUS THROMBOPHLEBITIS, CHRONIC  . ORTHOSTATIC DIZZINESS  . PRECORDIAL PAIN  . CONSTIPATION  . Mesenteric artery stenosis  . Hypercholesterolemia  . Tobacco abuse  . Chronic total occlusion of artery of the extremities

## 2012-08-04 NOTE — Assessment & Plan Note (Signed)
Known coronary artery disease but currently no chest pain.  Continue risk factor modification

## 2012-08-25 ENCOUNTER — Ambulatory Visit: Payer: Medicare Other | Admitting: Cardiology

## 2012-09-13 ENCOUNTER — Other Ambulatory Visit (HOSPITAL_COMMUNITY): Payer: Self-pay | Admitting: Pulmonary Disease

## 2012-09-13 DIAGNOSIS — I739 Peripheral vascular disease, unspecified: Secondary | ICD-10-CM

## 2012-10-02 ENCOUNTER — Ambulatory Visit (HOSPITAL_COMMUNITY)
Admission: RE | Admit: 2012-10-02 | Discharge: 2012-10-02 | Disposition: A | Discharge status: Home / Self Care | Payer: Medicare Other | Source: Ambulatory Visit | Attending: Pulmonary Disease | Admitting: Pulmonary Disease

## 2012-10-02 DIAGNOSIS — I739 Peripheral vascular disease, unspecified: Secondary | ICD-10-CM | POA: Insufficient documentation

## 2012-10-19 ENCOUNTER — Ambulatory Visit: Payer: Medicare Other | Admitting: Neurosurgery

## 2012-10-19 ENCOUNTER — Other Ambulatory Visit: Payer: Medicare Other

## 2012-10-23 ENCOUNTER — Ambulatory Visit: Payer: Medicare Other | Admitting: Neurosurgery

## 2012-10-23 ENCOUNTER — Other Ambulatory Visit: Payer: Medicare Other

## 2012-11-30 IMAGING — US US CAROTID DUPLEX BILAT
1 series · 13 of 24 positions shown · non-contrast
Comparison: Prior carotid duplex ultrasound 10/07/2010

CLINICAL DATA: Peripheral arterial disease

BILATERAL CAROTID DUPLEX ULTRASOUND
TECHNIQUE: Gray scale imaging, color Doppler and duplex ultrasound
was performed of bilateral carotid and vertebral arteries in the
neck.

[Series 1: us carotid duplex bilat · 0.05mm/px · 13 of 86 slices shown]
[im 1/86]
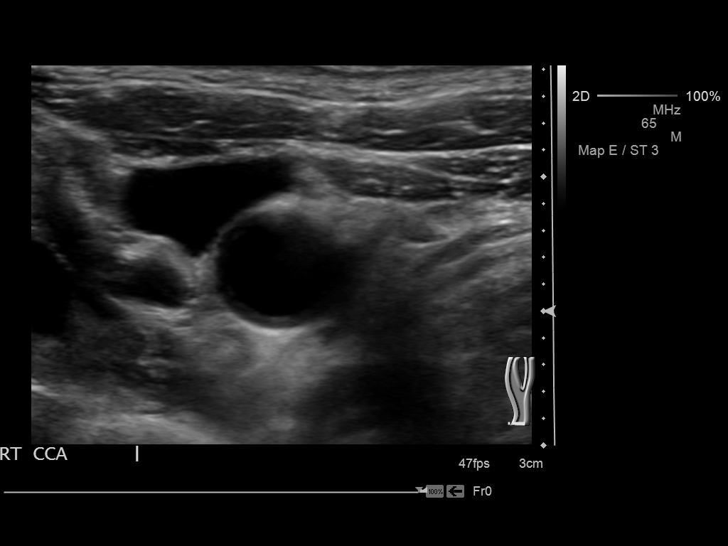
[im 8/86]
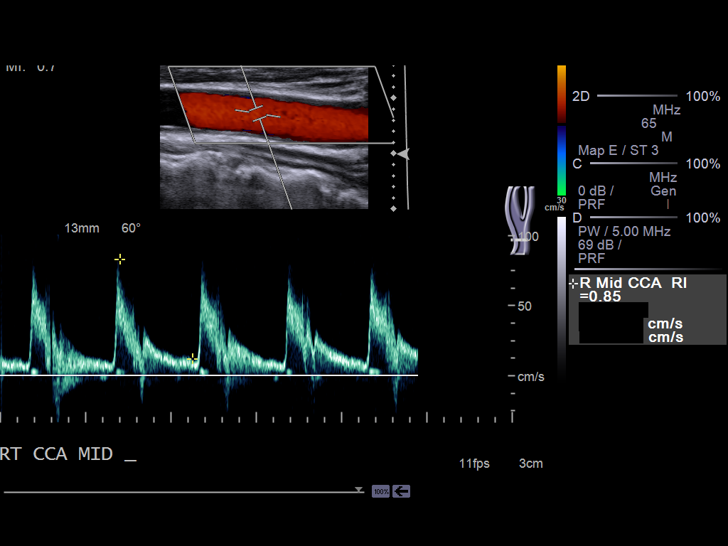
[im 15/86]
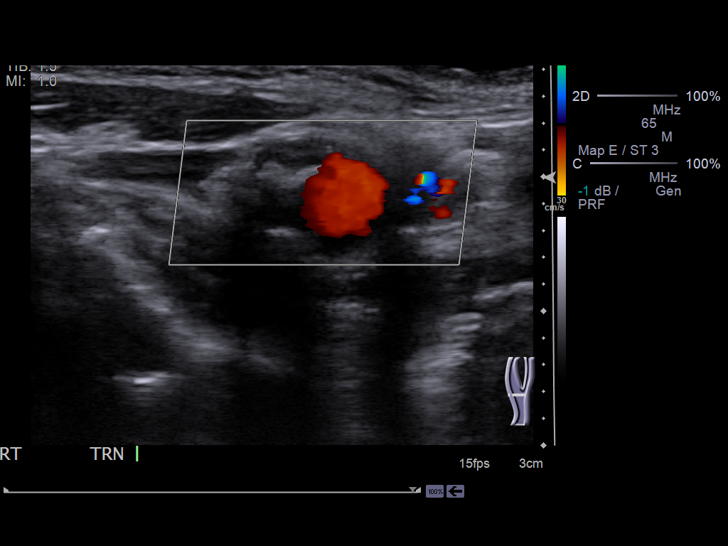
[im 23/86]
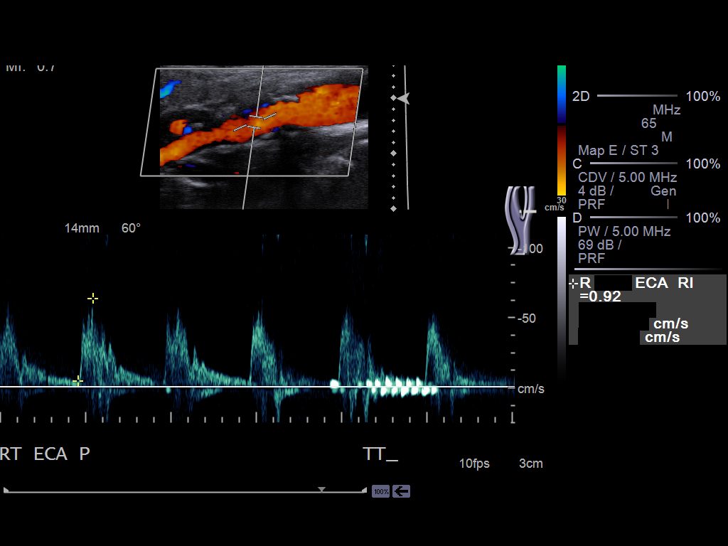
[im 30/86]
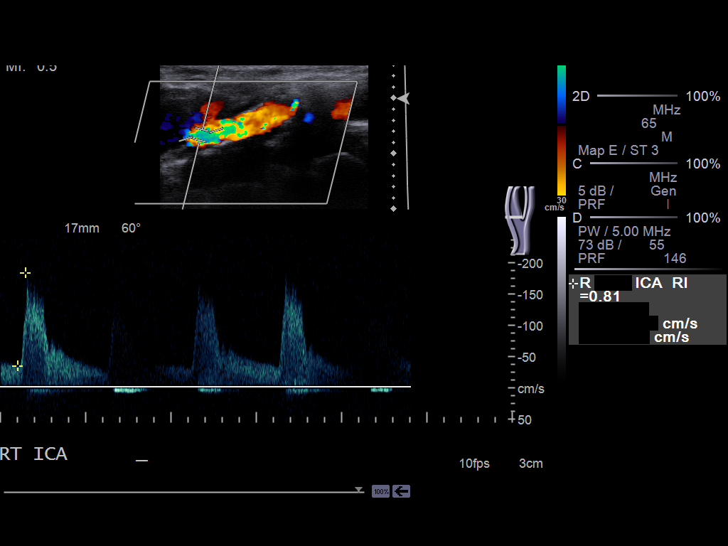
[im 37/86]
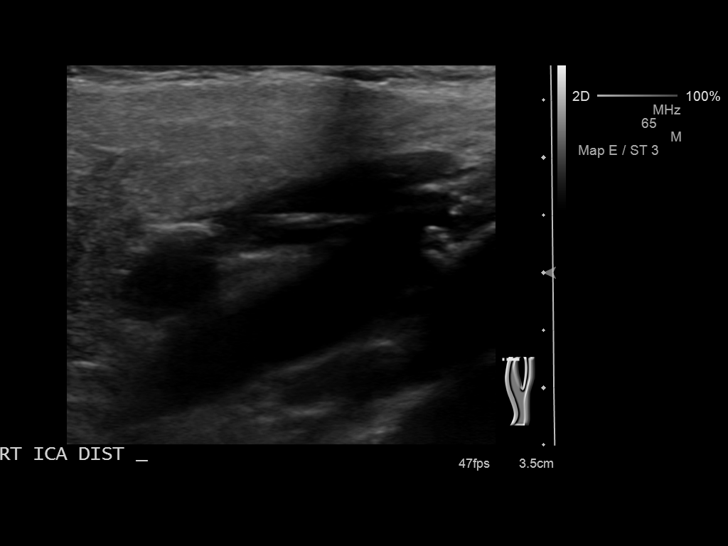
[im 45/86]
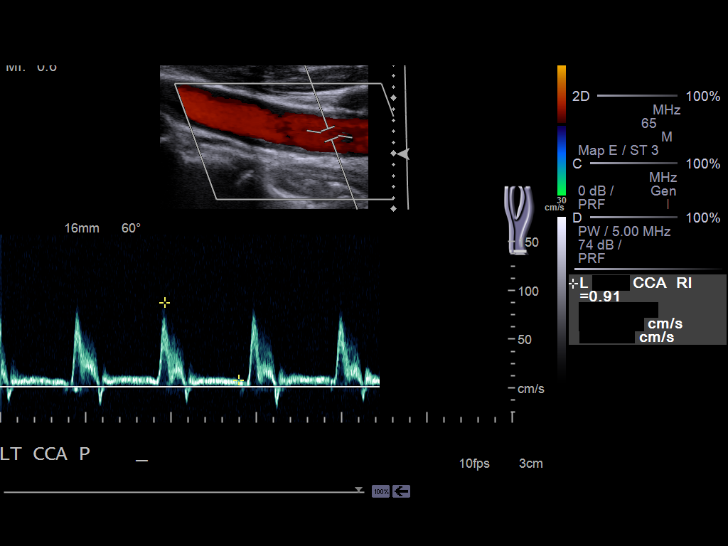
[im 49/86]
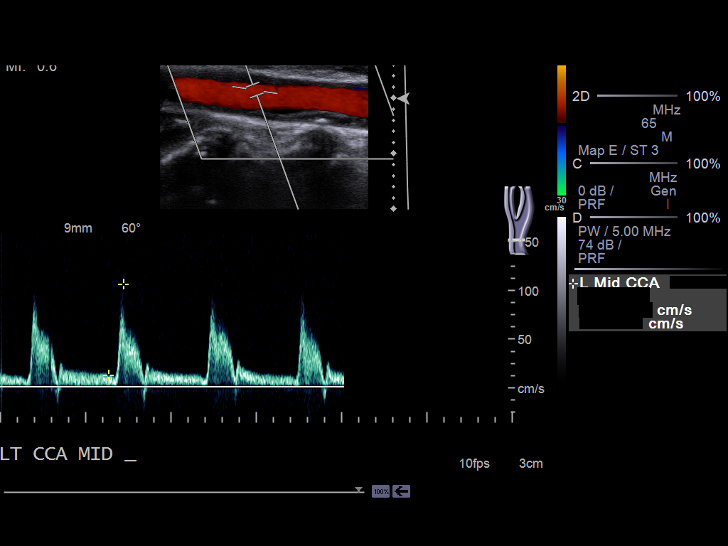
[im 56/86]
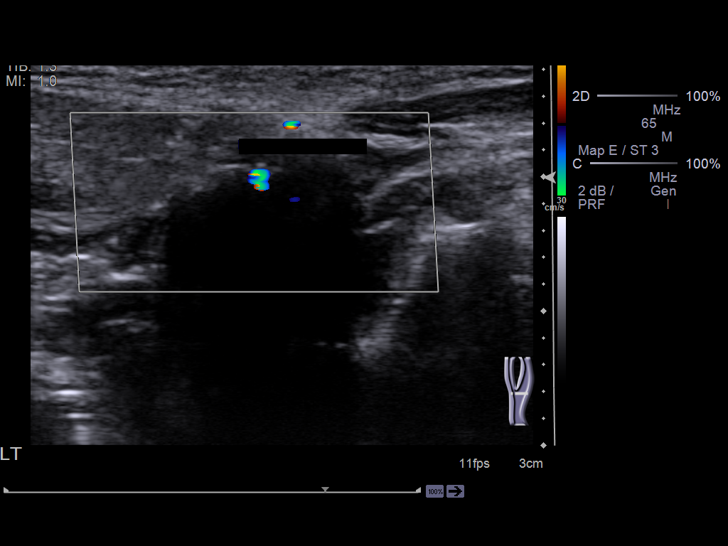
[im 63/86]
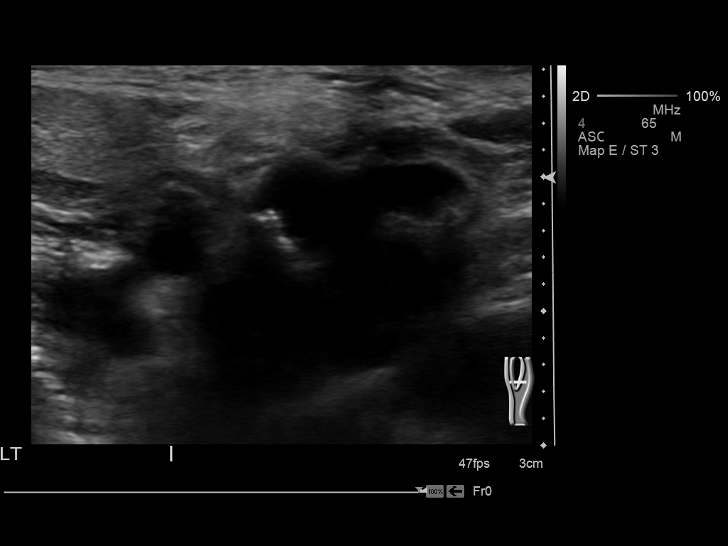
[im 71/86]
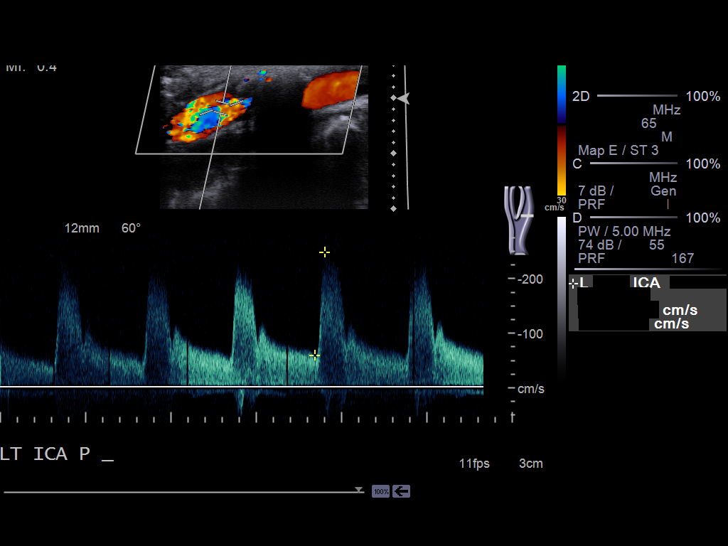
[im 78/86]
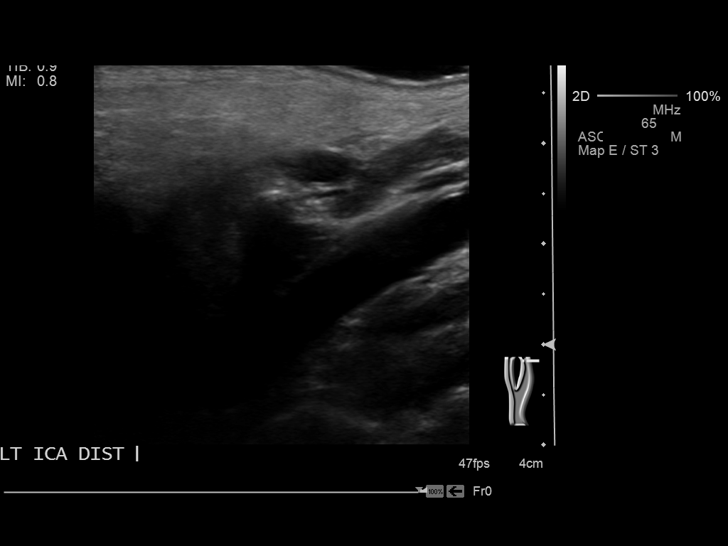
[im 86/86]
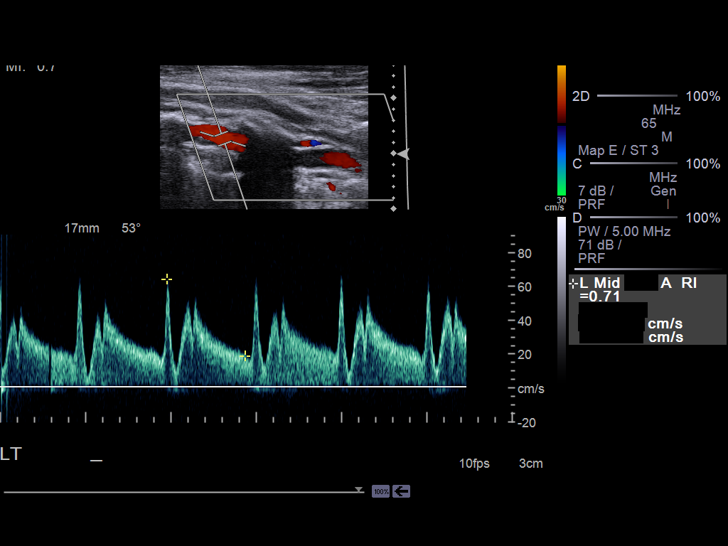

[13 of 24 positions shown; findings below may reference images not displayed]

Criteria:  Quantification of carotid stenosis is based on velocity
parameters that correlate the residual internal carotid diameter
with NASCET-based stenosis levels, using the diameter of the distal
internal carotid lumen as the denominator for stenosis measurement.

The following velocity measurements were obtained:

                 PEAK SYSTOLIC/END DIASTOLIC
RIGHT
ICA:                        187/35cm/sec
CCA:                        83/12cm/sec
SYSTOLIC ICA/CCA RATIO:
DIASTOLIC ICA/CCA RATIO:
ECA:                        64cm/sec

LEFT
ICA:                        249/60cm/sec
CCA:                        107/13cm/sec
SYSTOLIC ICA/CCA RATIO:
DIASTOLIC ICA/CCA RATIO:
ECA:                        88cm/sec
FINDINGS: RIGHT CAROTID ARTERY: Extensive calcified and noncalcified
atherosclerotic vascular plaque in the carotid bulb extending into
the proximal internal carotid artery.  There is turbulent flow with
a high velocity jet and spectral broadening in the proximal
internal carotid artery.

RIGHT VERTEBRAL ARTERY:  Patent with normal antegrade flow

LEFT CAROTID ARTERY: Extensive predominately calcified plaque in
the carotid bulb. Shadowing artifact from the calcified plaque
limits evaluation.  There is some irregularity of the plaque with
evidence of flow on color imaging which may represent calcium
artifact, or possibly plaque ulceration.  Calcium artifact is
favored.  The plaque extends into the origin of the internal
carotid artery.  There is a focal high velocity jets, turbulence
and spectral broadening with the maximal systolic velocity
indicating a hemodynamically significant stenosis.

LEFT VERTEBRAL ARTERY:  Patent with normal antegrade flow.
IMPRESSION: 1.  Heterogeneous calcified and noncalcified atherosclerotic plaque
in the right carotid bulb and proximal right internal carotid
artery results in a [DATE]% diameter stenosis.  No significant
interval change in the degree of stenosis compared to 10/07/2010
although subjectively there is increased calcification of the
plaque.

2.  Interval progression of heterogeneous predominately calcified
atherosclerotic plaque in the left carotid bulb and proximal
internal carotid artery resulting in a greater than 70% diameter
stenosis.

[REDACTED]

## 2013-09-19 ENCOUNTER — Other Ambulatory Visit (HOSPITAL_COMMUNITY): Payer: Self-pay | Admitting: Pulmonary Disease

## 2013-09-19 ENCOUNTER — Ambulatory Visit (HOSPITAL_COMMUNITY)
Admission: RE | Admit: 2013-09-19 | Discharge: 2013-09-19 | Disposition: A | Discharge status: Home / Self Care | Payer: Medicare Other | Source: Ambulatory Visit | Attending: Pulmonary Disease | Admitting: Pulmonary Disease

## 2013-09-19 DIAGNOSIS — J449 Chronic obstructive pulmonary disease, unspecified: Secondary | ICD-10-CM

## 2013-09-19 DIAGNOSIS — J4489 Other specified chronic obstructive pulmonary disease: Secondary | ICD-10-CM | POA: Insufficient documentation

## 2013-09-21 ENCOUNTER — Encounter: Payer: Self-pay | Admitting: Cardiology

## 2013-09-21 ENCOUNTER — Ambulatory Visit (INDEPENDENT_AMBULATORY_CARE_PROVIDER_SITE_OTHER): Payer: Medicare Other | Admitting: Cardiology

## 2013-09-21 VITALS — BP 107/63 | HR 53 | Ht 63.0 in | Wt 110.4 lb

## 2013-09-21 DIAGNOSIS — R079 Chest pain, unspecified: Secondary | ICD-10-CM

## 2013-09-21 NOTE — Progress Notes (Signed)
Clinical Summary Melissa Holland is a 67 y.o.female seen for follow up for the following problems.    1. CAD - cath 2008 with occluded RCA with good left to right collaterals, diffuse non-obstructive disease otherwise - LVEF 55-60% by echo in 2012 - reports some occasional chest discomfort with activity. Tightness in left chest, + SOB. Only occurs with exertion or stress. Has been going on for the last few months, notes increase in frequency over the last months. Better with rest. Better with nitroglycerin  2. HL - compliant with statin - no recent panel in our system, patient reports is followed by Dr [name]  3. PAD/Carotid disease - followed by vascular  Past Medical History  Diagnosis Date  . Diabetes mellitus   . Coronary artery disease     Severe single-vessel/100% occluded right with distal collateralization.Medical therapy/Normal LV fx  . Carotid bruit     Bilateral  . Sinus bradycardia   . Dyslipidemia   . DVT (deep venous thrombosis)     Remote portable as him   . COPD (chronic obstructive pulmonary disease)   . Arthritis   . Depression with anxiety   . Leg pain   . Productive cough   . Peripheral arterial disease   . Carotid artery occlusion      Allergies  Allergen Reactions  . Penicillins Anaphylaxis  . Sulfonamide Derivatives Anaphylaxis  . Alupent [Metaproterenol Sulfate] Other (See Comments)    Makes pt "nervous"  . Azithromycin Other (See Comments)    unknown  . Omnipaque [Iohexol] Other (See Comments)    unknown  . Paroxetine Other (See Comments)    unknown  . Varenicline Tartrate Other (See Comments)    unknown  . Benadryl [Diphenhydramine Hcl] Other (See Comments)    Can't breath  . Hydrocortisone Hives  . Prednisone Hives     Current Outpatient Prescriptions  Medication Sig Dispense Refill  . albuterol (2.5 MG/3ML) 0.083% NEBU 3 mL, albuterol (5 MG/ML) 0.5% NEBU 0.5 mL Inhale 2.5 mg into the lungs every 6 (six) hours as needed.         Marland Kitchen albuterol (VENTOLIN HFA) 108 (90 BASE) MCG/ACT inhaler Inhale 2 puffs into the lungs every 6 (six) hours as needed.      . ALPRAZolam (XANAX) 1 MG tablet Take 1 mg by mouth 4 (four) times daily as needed. For nerves      . aspirin 81 MG tablet Take 81 mg by mouth daily.        . bisacodyl (DULCOLAX) 5 MG EC tablet Take 20 mg by mouth daily as needed. For bowel movement.      . clonazePAM (KLONOPIN) 0.5 MG tablet Take 1 tablet (0.5 mg total) by mouth 2 (two) times daily.  60 tablet  1  . HYDROcodone-acetaminophen (NORCO) 7.5-325 MG per tablet Take 1 tablet by mouth every 6 (six) hours as needed. For pain.      Marland Kitchen lactose free nutrition (BOOST) LIQD Take 1 Container by mouth daily. Calorie Smart      . metFORMIN (GLUCOPHAGE) 500 MG tablet Take 1,000 mg by mouth 2 (two) times daily with a meal.        . metoprolol succinate (TOPROL-XL) 25 MG 24 hr tablet Take 12.5 mg by mouth every morning.       . nitroGLYCERIN (NITROSTAT) 0.4 MG SL tablet Place 1 tablet (0.4 mg total) under the tongue every 5 (five) minutes as needed. For chest pain.  25 tablet  3  . potassium chloride (KLOR-CON) 10 MEQ CR tablet Take 10 mEq by mouth as needed. When taking torsemide      . simvastatin (ZOCOR) 40 MG tablet Take 1 tablet (40 mg total) by mouth every evening.  90 tablet  3  . torsemide (DEMADEX) 100 MG tablet Take 50 mg by mouth daily as needed.      . traZODone (DESYREL) 100 MG tablet Take 1 tablet (100 mg total) by mouth at bedtime.  90 tablet  0  . traZODone (DESYREL) 100 MG tablet Take 1 tablet (100 mg total) by mouth at bedtime.  30 tablet  0   No current facility-administered medications for this visit.     Past Surgical History  Procedure Laterality Date  . Parotidectomy      Left  . Shoulder blade      Cysts x 2  . Tonsillectomy and adenoidectomy    . Partial hysterectomy      Subsequent completion later that year,hx of large fibroid tumor  . Appendectomy    . Aortogram      with bilateral lower  extremity run-off  . Abdominal hysterectomy       Allergies  Allergen Reactions  . Penicillins Anaphylaxis  . Sulfonamide Derivatives Anaphylaxis  . Alupent [Metaproterenol Sulfate] Other (See Comments)    Makes pt "nervous"  . Azithromycin Other (See Comments)    unknown  . Omnipaque [Iohexol] Other (See Comments)    unknown  . Paroxetine Other (See Comments)    unknown  . Varenicline Tartrate Other (See Comments)    unknown  . Benadryl [Diphenhydramine Hcl] Other (See Comments)    Can't breath  . Hydrocortisone Hives  . Prednisone Hives      Family History  Problem Relation Age of Onset  . Cancer Maternal Grandmother     liver  . Cancer Maternal Grandfather     lung  . Diabetes Mother   . Diabetes Son      Social History [name] reports that she has been smoking Cigarettes.  She has a 52 pack-year smoking history. She has never used smokeless tobacco. [name] reports that she does not drink alcohol.   Review of Systems CONSTITUTIONAL: No weight loss, fever, chills, weakness or fatigue.  HEENT: Eyes: No visual loss, blurred vision, double vision or yellow sclerae.No hearing loss, sneezing, congestion, runny nose or sore throat.  SKIN: No rash or itching.  CARDIOVASCULAR: per HPI RESPIRATORY: per HPI  GASTROINTESTINAL: No anorexia, nausea, vomiting or diarrhea. No abdominal pain or blood.  GENITOURINARY: No burning on urination, no polyuria NEUROLOGICAL: No headache, dizziness, syncope, paralysis, ataxia, numbness or tingling in the extremities. No change in bowel or bladder control.  MUSCULOSKELETAL: chronic bilateral leg pain LYMPHATICS: No enlarged nodes. No history of splenectomy.  PSYCHIATRIC: No history of depression or anxiety.  ENDOCRINOLOGIC: No reports of sweating, cold or heat intolerance. No polyuria or polydipsia.  Marland Kitchen   Physical Examination p 53 bp 107/63 Wt 110 lbs BMI 20  Gen: resting comfortably, no acute distress HEENT: no scleral  icterus, pupils equal round and reactive, no palptable cervical adenopathy,  CV: RRR, no m/r/g, no JVD, bilateral carotid bruit Resp: Clear to auscultation bilaterally GI: abdomen is soft, non-tender, non-distended, normal bowel sounds, no hepatosplenomegaly MSK: extremities are warm, no edema.  Skin: warm, no rash Neuro:  no focal deficits Psych: appropriate affect   Diagnostic Studies 12/2006 Cath 1. LV 107/9, AO 108/72.  2. Coronaries. Left main had an  ostial calcified 30% stenosis. The  LAD had long proximal calcification. There was long proximal 30%  stenosis. There was mid tandem 30% stenosis. The vessel wrapped  the apex. The first diagonal was tiny and normal. The second  diagonal was small and normal. The circumflex had proximal  calcification. There was long proximal 25% stenosis. There was  mid calcified 25% stenosis. There was mid 50-60% stenosis before  an obtuse marginal. Mid obtuse marginal was origin branching.  There was mid 25% stenosis. The right coronary artery was occluded  in the mid segment. There were extensive left-to-right  collaterals.  LEFT VENTRICULOGRAM: The left ventriculogram was obtained in the RAO  projection. The EF was 60% with normal wall motion.   CONCLUSION: Severe single-vessel coronary artery disease with excellent  collateral flow. Nonobstructive extensive disease elsewhere with heavy  calcification. Well-preserved ejection fraction.  PLAN: The patient needs extensive secondary risk reduction. She is  high risk for future cardiovascular events if she does not stop smoking.  She needs a statin. She needs good control of her diabetes. I will  discuss this with Dr. [name].   12/12 Echo LVEF 55-60%, no WMAs   09/2012 Carotid US:  IMPRESSION:  1. Heterogeneous calcified and noncalcified atherosclerotic plaque in the right carotid bulb and proximal right internal carotid artery results in a 50 - 69% diameter stenosis. No  significant interval change in the degree of stenosis compared to 10/07/2010 although subjectively there is increased calcification of the plaque.  2. Interval progression of heterogeneous predominately calcified atherosclerotic plaque in the left carotid bulb and proximal internal carotid artery resulting in a greater than 70% diameter stenosis.  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 CAD - recent episodes of chest pain associated with stress and exertion, concerning for progression of her CAD - will get lexiscan MPI to evalute for ischemia and risk stratigy  2. HL - management per PCP, continue current statin  3. PAD/Carotid stenosis - patient will continue her regular follow up with vascular    Antoine Poche, M.D., F.A.C.C.

## 2013-09-21 NOTE — Patient Instructions (Signed)
Your physician recommends that you schedule a follow-up appointment in: 1 month with Dr. Wyline Mood. This appointment will be scheduled today before you leave.  Your physician recommends that you continue on your current medications as directed. Please refer to the Current Medication list given to you today.  Your physician has requested that you have a lexiscan myoview. For further information please visit [email or web address] Please follow instruction sheet, as given.

## 2013-09-24 ENCOUNTER — Telehealth: Payer: Self-pay | Admitting: Cardiology

## 2013-09-24 NOTE — Telephone Encounter (Signed)
Patient has questions about having to take her stress test. She is concerned about her Anxiety attacks, her medications. Wants to know if she can drink water.

## 2013-09-24 NOTE — Telephone Encounter (Signed)
Spoke with patient about her stress test instructions. Informed pt that she can take her medications like she normally does with a sip of water and that she can have water to drink prior to her test. Also pt wanted Clonazepam (Klonpin) removed from a medication list she said she had never taken it and she doesn't know why it is there.

## 2013-10-02 ENCOUNTER — Inpatient Hospital Stay (HOSPITAL_COMMUNITY): Admission: RE | Admit: 2013-10-02 | Payer: Medicare Other | Source: Ambulatory Visit

## 2013-10-02 ENCOUNTER — Encounter (HOSPITAL_COMMUNITY): Payer: Medicare Other

## 2013-10-10 ENCOUNTER — Encounter (HOSPITAL_COMMUNITY)
Admission: RE | Admit: 2013-10-10 | Discharge: 2013-10-10 | Disposition: A | Discharge status: Home / Self Care | Payer: Medicare Other | Source: Ambulatory Visit | Attending: Cardiology | Admitting: Cardiology

## 2013-10-10 ENCOUNTER — Encounter (HOSPITAL_COMMUNITY): Payer: Self-pay

## 2013-10-10 DIAGNOSIS — R9439 Abnormal result of other cardiovascular function study: Secondary | ICD-10-CM | POA: Insufficient documentation

## 2013-10-10 DIAGNOSIS — R079 Chest pain, unspecified: Secondary | ICD-10-CM | POA: Insufficient documentation

## 2013-10-10 DIAGNOSIS — I2510 Atherosclerotic heart disease of native coronary artery without angina pectoris: Secondary | ICD-10-CM | POA: Insufficient documentation

## 2013-10-10 MED ORDER — REGADENOSON 0.4 MG/5ML IV SOLN
INTRAVENOUS | Status: AC
  Administered 2013-10-10: 0.4 mg via INTRAVENOUS
  Filled 2013-10-10: qty 5

## 2013-10-10 MED ORDER — SODIUM CHLORIDE 0.9 % IJ SOLN
INTRAMUSCULAR | Status: AC
  Administered 2013-10-10: 10 mL via INTRAVENOUS
  Filled 2013-10-10: qty 10

## 2013-10-10 MED ORDER — TECHNETIUM TC 99M SESTAMIBI - CARDIOLITE
30.0000 | Freq: Once | INTRAVENOUS | Status: AC | PRN
  Administered 2013-10-10: 30 via INTRAVENOUS

## 2013-10-10 MED ORDER — TECHNETIUM TC 99M SESTAMIBI GENERIC - CARDIOLITE
10.0000 | Freq: Once | INTRAVENOUS | Status: AC | PRN
  Administered 2013-10-10: 10 via INTRAVENOUS

## 2013-10-10 NOTE — Progress Notes (Signed)
Stress Lab Nurses Notes - [name]  Melissa Holland 10/10/2013 Reason for doing test: Chest Pain Type of test: Marlane Hatcher Nurse performing test: Parke Poisson, RN Nuclear Medicine Tech: Lou Cal Echo Tech: Not Applicable MD performing test: Branch / K.Lawrence NP Family MD: [name] Test explained and consent signed: yes IV started: 22g jelco, Saline lock flushed, No redness or edema and Saline lock started in radiology Symptoms: headache Treatment/Intervention: None Reason test stopped: protocol completed After recovery IV was: Discontinued via X-ray tech and No redness or edema Patient to return to Nuc. Med at : 12:00 Patient discharged: Home Patient's Condition upon discharge was: stable Comments: During test BP 133/72 & HR 99.  Recovery BP 143/68 & HR 70. Symptoms resolved in recovery. [name]

## 2013-10-12 ENCOUNTER — Telehealth: Payer: Self-pay | Admitting: Cardiology

## 2013-10-12 DIAGNOSIS — R079 Chest pain, unspecified: Secondary | ICD-10-CM

## 2013-10-12 NOTE — Telephone Encounter (Signed)
Pt informed of results. Pt wanted to know if the pumping function being decreased could be the cause of chest pain and her dizzines? Reminded pt of appt scheduled for Tuesday 10-16-13. And informed pt that I would contact her when her echo is scheduled.

## 2013-10-12 NOTE — Telephone Encounter (Signed)
Message copied by Burnice Logan on Fri Oct 12, 2013  5:03 PM ------      Message from: [place] F      Created: Fri Oct 12, 2013  9:51 AM       Please let patient know her stress tests shows evidence of prior blockages, no severe new blockages. It does suggest that the pumping function of her heart is decreased, she needs a repeat echo to further evaluate this. Please order a 2D echo without contrast for chest pain.                  Dina Rich MD ------

## 2013-10-15 ENCOUNTER — Other Ambulatory Visit: Payer: Self-pay | Admitting: Cardiology

## 2013-10-15 NOTE — Telephone Encounter (Signed)
It may. Its unclear if her pumping function is truly decreased, the type of stress test she had is only moderately accurate in evaluating the pumping function. The echo which we have ordered will tell us for sure if its decreased.   Dina Rich MD

## 2013-10-15 NOTE — Telephone Encounter (Signed)
Pt informed why second test is needed. Pt verbalized that she understands and that she would like it scheduled after the 1st of December.

## 2013-10-16 ENCOUNTER — Encounter: Payer: Self-pay | Admitting: Cardiology

## 2013-10-16 ENCOUNTER — Ambulatory Visit (INDEPENDENT_AMBULATORY_CARE_PROVIDER_SITE_OTHER): Payer: Medicare Other | Admitting: Cardiology

## 2013-10-16 VITALS — BP 145/74 | HR 63 | Ht 63.0 in | Wt 109.0 lb

## 2013-10-16 DIAGNOSIS — R079 Chest pain, unspecified: Secondary | ICD-10-CM

## 2013-10-16 DIAGNOSIS — I2510 Atherosclerotic heart disease of native coronary artery without angina pectoris: Secondary | ICD-10-CM

## 2013-10-16 MED ORDER — ISOSORBIDE MONONITRATE ER 30 MG PO TB24: 30.0000 mg | ORAL_TABLET | Freq: Every day | ORAL | Status: DC

## 2013-10-16 NOTE — Progress Notes (Signed)
Clinical Summary Ms. Chicoine is a 67 y.o.female comes in today for follow up, this is a focused visit on her CAD. For more detailed visit see my note 09/21/13  1. CAD  - cath 2008 with occluded RCA with good left to right collaterals, diffuse non-obstructive disease otherwise  - LVEF 55-60% by echo in 2012  - reports some occasional chest discomfort with activity. Tightness in left chest, + SOB. Only occurs with exertion or stress. Has been going on for the last few months, notes increase in frequency over the last months. Better with rest. Better with nitroglycerin   - symptoms unchanged since our last visit.     Past Medical History  Diagnosis Date  . Diabetes mellitus   . Coronary artery disease     Severe single-vessel/100% occluded right with distal collateralization.Medical therapy/Normal LV fx  . Carotid bruit     Bilateral  . Sinus bradycardia   . Dyslipidemia   . DVT (deep venous thrombosis)     Remote portable as him   . COPD (chronic obstructive pulmonary disease)   . Arthritis   . Depression with anxiety   . Leg pain   . Productive cough   . Peripheral arterial disease   . Carotid artery occlusion      Allergies  Allergen Reactions  . Penicillins Anaphylaxis  . Sulfonamide Derivatives Anaphylaxis  . Alupent [Metaproterenol Sulfate] Other (See Comments)    Makes pt "nervous"  . Azithromycin Other (See Comments)    unknown  . Omnipaque [Iohexol] Other (See Comments)    unknown  . Paroxetine Other (See Comments)    unknown  . Varenicline Tartrate Other (See Comments)    unknown  . Benadryl [Diphenhydramine Hcl] Other (See Comments)    Can't breath  . Hydrocortisone Hives  . Prednisone Hives     Current Outpatient Prescriptions  Medication Sig Dispense Refill  . albuterol (2.5 MG/3ML) 0.083% NEBU 3 mL, albuterol (5 MG/ML) 0.5% NEBU 0.5 mL Inhale 2.5 mg into the lungs every 6 (six) hours as needed.       Marland Kitchen albuterol (VENTOLIN HFA) 108 (90 BASE)  MCG/ACT inhaler Inhale 2 puffs into the lungs every 6 (six) hours as needed.      . ALPRAZolam (XANAX) 1 MG tablet Take 1 mg by mouth 4 (four) times daily as needed. For nerves      . aspirin 81 MG tablet Take 81 mg by mouth daily.        . bisacodyl (DULCOLAX) 5 MG EC tablet Take 20 mg by mouth daily as needed. For bowel movement.      . furosemide (LASIX) 20 MG tablet Take 20 mg by mouth as needed.      . metFORMIN (GLUCOPHAGE) 500 MG tablet Take 1,000 mg by mouth 2 (two) times daily with a meal.        . metoprolol succinate (TOPROL-XL) 25 MG 24 hr tablet Take 12.5 mg by mouth every morning.       . nitroGLYCERIN (NITROSTAT) 0.4 MG SL tablet Place 1 tablet (0.4 mg total) under the tongue every 5 (five) minutes as needed. For chest pain.  25 tablet  3  . oxyCODONE-acetaminophen (PERCOCET) 10-325 MG per tablet Take 1 tablet by mouth every 4 (four) hours as needed for pain.      . potassium chloride (KLOR-CON) 10 MEQ CR tablet Take 10 mEq by mouth as needed. When taking Furosemide      . simvastatin (ZOCOR)  40 MG tablet Take 1 tablet (40 mg total) by mouth every evening.  90 tablet  3   No current facility-administered medications for this visit.     Past Surgical History  Procedure Laterality Date  . Parotidectomy      Left  . Shoulder blade      Cysts x 2  . Tonsillectomy and adenoidectomy    . Partial hysterectomy      Subsequent completion later that year,hx of large fibroid tumor  . Appendectomy    . Aortogram      with bilateral lower extremity run-off  . Abdominal hysterectomy       Allergies  Allergen Reactions  . Penicillins Anaphylaxis  . Sulfonamide Derivatives Anaphylaxis  . Alupent [Metaproterenol Sulfate] Other (See Comments)    Makes pt "nervous"  . Azithromycin Other (See Comments)    unknown  . Omnipaque [Iohexol] Other (See Comments)    unknown  . Paroxetine Other (See Comments)    unknown  . Varenicline Tartrate Other (See Comments)    unknown  .  Benadryl [Diphenhydramine Hcl] Other (See Comments)    Can't breath  . Hydrocortisone Hives  . Prednisone Hives      Family History  Problem Relation Age of Onset  . Cancer Maternal Grandmother     liver  . Cancer Maternal Grandfather     lung  . Diabetes Mother   . Diabetes Son      Social History Ms. Shaneyfelt reports that she has been smoking Cigarettes.  She has a 52 pack-year smoking history. She has never used smokeless tobacco. Ms. Habeck reports that she does not drink alcohol.   Review of Systems CONSTITUTIONAL: No weight loss, fever, chills, weakness or fatigue.  HEENT: Eyes: No visual loss, blurred vision, double vision or yellow sclerae.No hearing loss, sneezing, congestion, runny nose or sore throat.  SKIN: No rash or itching.  CARDIOVASCULAR: per HPI RESPIRATORY: per HPI.  GASTROINTESTINAL: No anorexia, nausea, vomiting or diarrhea. No abdominal pain or blood.  GENITOURINARY: No burning on urination, no polyuria NEUROLOGICAL: No headache, dizziness, syncope, paralysis, ataxia, numbness or tingling in the extremities. No change in bowel or bladder control.  LYMPHATICS: No enlarged nodes. No history of splenectomy.  PSYCHIATRIC: No history of depression or anxiety.  ENDOCRINOLOGIC: No reports of sweating, cold or heat intolerance. No polyuria or polydipsia.  Marland Kitchen   Physical Examination p 63 bp 145/74 Wt 109 lbs BMI19 Gen: resting comfortably, no acute distress HEENT: no scleral icterus, pupils equal round and reactive, no palptable cervical adenopathy,  CV: RRR, no m/r/g,no JVD, no carotid bruits Resp: Clear to auscultation bilaterally GI: abdomen is soft, non-tender, non-distended, normal bowel sounds, no hepatosplenomegaly MSK: extremities are warm, no edema.  Skin: warm, no rash Neuro:  no focal deficits Psych: appropriate affect   Diagnostic Studies 12/2006 Cath  1. LV 107/9, AO 108/72.  2. Coronaries. Left main had an ostial calcified 30% stenosis. The   LAD had long proximal calcification. There was long proximal 30%  stenosis. There was mid tandem 30% stenosis. The vessel wrapped  the apex. The first diagonal was tiny and normal. The second  diagonal was small and normal. The circumflex had proximal  calcification. There was long proximal 25% stenosis. There was  mid calcified 25% stenosis. There was mid 50-60% stenosis before  an obtuse marginal. Mid obtuse marginal was origin branching.  There was mid 25% stenosis. The right coronary artery was occluded  in the mid segment. There  were extensive left-to-right  collaterals.  LEFT VENTRICULOGRAM: The left ventriculogram was obtained in the RAO  projection. The EF was 60% with normal wall motion.  CONCLUSION: Severe single-vessel coronary artery disease with excellent  collateral flow. Nonobstructive extensive disease elsewhere with heavy  calcification. Well-preserved ejection fraction.  PLAN: The patient needs extensive secondary risk reduction. She is  high risk for future cardiovascular events if she does not stop smoking.  She needs a statin. She needs good control of her diabetes. I will  discuss this with Dr. [name].   12/12 Echo  LVEF 55-60%, no WMAs   09/2012 Carotid US:  IMPRESSION:  1. Heterogeneous calcified and noncalcified atherosclerotic plaque in the right carotid bulb and proximal right internal carotid artery results in a 50 - 69% diameter stenosis. No significant interval change in the degree of stenosis compared to 10/07/2010 although subjectively there is increased calcification of the plaque.  2. Interval progression of heterogeneous predominately calcified atherosclerotic plaque in the left carotid bulb and proximal internal carotid artery resulting in a greater than 70% diameter stenosis.  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 Abnormal lexiscan MPI. There is a fixed  apical defect consistent with prior infarction. There is an inferolateral wall defect with mild peri-infarct ischemia, the interpretation of this defect is limited by significant subdiaphragmatic attenuation adjacent to inferolateral wall. There is hypokinesis of this area, so likely there is a true prior infarct. Interpretation of peri-infarct ischemia of this territory may be limited by subdiaphragmatic attenuation. Correlate findings clinically.  2. Decrased left ventricular systolic function      Assessment and Plan  1. 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  - MPI also indicates decreased systolic function, will obtain echo to further evalute - will add imdur as antianginal and follow medically, pending response to therapy and follow up evaluation of her systolic function, she may need a heart cath at some point.    Antoine Poche, M.D., F.A.C.C.

## 2013-10-16 NOTE — Patient Instructions (Signed)
Your physician recommends that you schedule a follow-up appointment in: 2 months with Dr. Wyline Mood. This appointment will be scheduled today before you leave.  Your physician has recommended you make the following change in your medication:  Start Imdur 30 MG once daily  Continue all other medications the same.

## 2013-10-17 ENCOUNTER — Other Ambulatory Visit: Payer: Self-pay | Admitting: Cardiology

## 2013-10-17 MED ORDER — ISOSORBIDE MONONITRATE ER 30 MG PO TB24: 30.0000 mg | ORAL_TABLET | Freq: Every day | ORAL | Status: DC

## 2013-10-17 NOTE — Telephone Encounter (Signed)
Pt informed that medication refill was sent to [facility]

## 2013-10-31 ENCOUNTER — Other Ambulatory Visit (INDEPENDENT_AMBULATORY_CARE_PROVIDER_SITE_OTHER): Payer: Medicare Other

## 2013-10-31 ENCOUNTER — Other Ambulatory Visit: Payer: Self-pay

## 2013-10-31 DIAGNOSIS — R079 Chest pain, unspecified: Secondary | ICD-10-CM

## 2013-10-31 DIAGNOSIS — I059 Rheumatic mitral valve disease, unspecified: Secondary | ICD-10-CM

## 2013-10-31 DIAGNOSIS — I359 Nonrheumatic aortic valve disorder, unspecified: Secondary | ICD-10-CM

## 2013-11-06 ENCOUNTER — Telehealth: Payer: Self-pay | Admitting: Cardiology

## 2013-11-06 NOTE — Telephone Encounter (Signed)
Patient states when she over exerts herself while doing house work she notices that's when she becomes short of breath and chest pain that spreads into her left arm and into her neck, jaw, temple and she then becomes light headed. These symptoms are on her left side. She wants to know if this could be because of the prior blockages she has had? I informed pt that if symptoms got worse she needed to call 911 or go to ER. Pt verbalized understanding but she said she doesn't like going to ER but would go if she felt like it was life threatening.

## 2013-11-06 NOTE — Telephone Encounter (Signed)
Message copied by Burnice Logan on Tue Nov 06, 2013  2:55 PM ------      Message from: [place] F      Created: Mon Nov 05, 2013  4:44 PM       Please let patient know that echo shows no new findings. She has evidence of prior blockages that we have seen before, but no new findings                  Dina Rich MD ------

## 2013-11-06 NOTE — Telephone Encounter (Signed)
Pt informed of results.

## 2013-11-07 NOTE — Telephone Encounter (Signed)
Without further evaluation unable to tell if related to blockage. If symptoms progress she needs to go to the ER. Please see if she can see me in clinic as an add on this week.     Dina Rich MD

## 2013-11-08 ENCOUNTER — Encounter: Payer: Self-pay | Admitting: Cardiology

## 2013-11-08 ENCOUNTER — Ambulatory Visit (INDEPENDENT_AMBULATORY_CARE_PROVIDER_SITE_OTHER): Payer: Medicare Other | Admitting: Cardiology

## 2013-11-08 ENCOUNTER — Telehealth: Payer: Self-pay | Admitting: Cardiology

## 2013-11-08 VITALS — BP 128/66 | HR 52 | Ht 63.0 in | Wt 110.0 lb

## 2013-11-08 DIAGNOSIS — R079 Chest pain, unspecified: Secondary | ICD-10-CM

## 2013-11-08 DIAGNOSIS — I2510 Atherosclerotic heart disease of native coronary artery without angina pectoris: Secondary | ICD-10-CM

## 2013-11-08 DIAGNOSIS — R072 Precordial pain: Secondary | ICD-10-CM

## 2013-11-08 NOTE — Telephone Encounter (Signed)
:   Left Heart Cath with Coronary angiograph DX: Chest Pain Date: Tuesday 11-13-13

## 2013-11-08 NOTE — Patient Instructions (Signed)
Your physician has requested that you have a cardiac catheterization. Cardiac catheterization is used to diagnose and/or treat various heart conditions. Doctors may recommend this procedure for a number of different reasons. The most common reason is to evaluate chest pain. Chest pain can be a symptom of coronary artery disease (CAD), and cardiac catheterization can show whether plaque is narrowing or blocking your heart's arteries. This procedure is also used to evaluate the valves, as well as measure the blood flow and oxygen levels in different parts of your heart. For further information please visit [email or web address] Please follow instruction sheet, as given.  You will be given a follow up appointment at discharge from hospital after CATH.  If your symptoms worse over the weekend go to emergency room or call 911.

## 2013-11-08 NOTE — Progress Notes (Signed)
   Clinical Summary Melissa Holland is a 67 y.o.female comes in today as an add on appontment, this is a focused visit on her CAD. For more detailed visit see my note 09/21/13   1. CAD  - cath 2008 with occluded RCA with good left to right collaterals, diffuse non-obstructive disease otherwise  - LVEF 55-60% by echo in 2012  - 09/21/13 MPI inferior infarct, technically difficult to evaluate peri-infarct ischemia but appears to be mild   - reports chest pain is increasing in severity over the last few weeks. Feels heart thumping, then throbbing pain left chest up to left neck to left jaw. Pain is moderate. + SOB + Lightheadedness. Often brought on with vacuuming, mopping, or changing sheets it can occur. Notes increase in frequency in symptoms since weather has gotten colder. She has not taken any NG because cause headaches. Can sometimes be improved with tums. Symptoms occur every 2-3 days.  Associated with generalized fatigue. Increased DOE.  - last visit started on imdur without significant improvement.  Past Medical History  Diagnosis Date  . Diabetes mellitus   . Coronary artery disease     Severe single-vessel/100% occluded right with distal collateralization.Medical therapy/Normal LV fx  . Carotid bruit     Bilateral  . Sinus bradycardia   . Dyslipidemia   . DVT (deep venous thrombosis)     Remote portable as him   . COPD (chronic obstructive pulmonary disease)   . Arthritis   . Depression with anxiety   . Leg pain   . Productive cough   . Peripheral arterial disease   . Carotid artery occlusion      Allergies  Allergen Reactions  . Penicillins Anaphylaxis  . Sulfonamide Derivatives Anaphylaxis  . Alupent [Metaproterenol Sulfate] Other (See Comments)    Makes pt "nervous"  . Azithromycin Other (See Comments)    unknown  . Omnipaque [Iohexol] Other (See Comments)    unknown  . Paroxetine Other (See Comments)    unknown  . Varenicline Tartrate Other (See Comments)    unknown  . Benadryl [Diphenhydramine Hcl] Other (See Comments)    Can't breath  . Hydrocortisone Hives  . Prednisone Hives     Current Outpatient Prescriptions  Medication Sig Dispense Refill  . albuterol (2.5 MG/3ML) 0.083% NEBU 3 mL, albuterol (5 MG/ML) 0.5% NEBU 0.5 mL Inhale 2.5 mg into the lungs every 6 (six) hours as needed.       . albuterol (VENTOLIN HFA) 108 (90 BASE) MCG/ACT inhaler Inhale 2 puffs into the lungs every 6 (six) hours as needed.      . ALPRAZolam (XANAX) 1 MG tablet Take 1 mg by mouth 4 (four) times daily as needed. For nerves      . aspirin 81 MG tablet Take 81 mg by mouth daily.        . bisacodyl (DULCOLAX) 5 MG EC tablet Take 20 mg by mouth daily as needed. For bowel movement.      . furosemide (LASIX) 20 MG tablet Take 20 mg by mouth as needed.      . isosorbide mononitrate (IMDUR) 30 MG 24 hr tablet Take 1 tablet (30 mg total) by mouth daily.  90 tablet  3  . metFORMIN (GLUCOPHAGE) 500 MG tablet Take 1,000 mg by mouth 2 (two) times daily with a meal.        . metoprolol succinate (TOPROL-XL) 25 MG 24 hr tablet Take 12.5 mg by mouth every morning.       .   nitroGLYCERIN (NITROSTAT) 0.4 MG SL tablet Place 1 tablet (0.4 mg total) under the tongue every 5 (five) minutes as needed. For chest pain.  25 tablet  3  . oxyCODONE-acetaminophen (ENDOCET) 10-325 MG per tablet Take 1 tablet by mouth every 4 (four) hours as needed for pain.      . potassium chloride (KLOR-CON) 10 MEQ CR tablet Take 10 mEq by mouth as needed. When taking Furosemide      . simvastatin (ZOCOR) 40 MG tablet Take 1 tablet (40 mg total) by mouth every evening.  90 tablet  3   No current facility-administered medications for this visit.     Past Surgical History  Procedure Laterality Date  . Parotidectomy      Left  . Shoulder blade      Cysts x 2  . Tonsillectomy and adenoidectomy    . Partial hysterectomy      Subsequent completion later that year,hx of large fibroid tumor  .  Appendectomy    . Aortogram      with bilateral lower extremity run-off  . Abdominal hysterectomy       Allergies  Allergen Reactions  . Penicillins Anaphylaxis  . Sulfonamide Derivatives Anaphylaxis  . Alupent [Metaproterenol Sulfate] Other (See Comments)    Makes pt "nervous"  . Azithromycin Other (See Comments)    unknown  . Omnipaque [Iohexol] Other (See Comments)    unknown  . Paroxetine Other (See Comments)    unknown  . Varenicline Tartrate Other (See Comments)    unknown  . Benadryl [Diphenhydramine Hcl] Other (See Comments)    Can't breath  . Hydrocortisone Hives  . Prednisone Hives      Family History  Problem Relation Age of Onset  . Cancer Maternal Grandmother     liver  . Cancer Maternal Grandfather     lung  . Diabetes Mother   . Diabetes Son      Social History Melissa Holland reports that she has been smoking Cigarettes.  She has a 52 pack-year smoking history. She has never used smokeless tobacco. Melissa Holland reports that she does not drink alcohol.   Review of Systems CONSTITUTIONAL: No weight loss, fever, chills, weakness or fatigue.  HEENT: Eyes: No visual loss, blurred vision, double vision or yellow sclerae.No hearing loss, sneezing, congestion, runny nose or sore throat.  SKIN: No rash or itching.  CARDIOVASCULAR: per HPI RESPIRATORY: per HPI  GASTROINTESTINAL: No anorexia, nausea, vomiting or diarrhea. No abdominal pain or blood.  GENITOURINARY: No burning on urination, no polyuria NEUROLOGICAL: No headache, dizziness, syncope, paralysis, ataxia, numbness or tingling in the extremities. No change in bowel or bladder control.  MUSCULOSKELETAL: No muscle, back pain, joint pain or stiffness.  LYMPHATICS: No enlarged nodes. No history of splenectomy.  PSYCHIATRIC: No history of depression or anxiety.  ENDOCRINOLOGIC: No reports of sweating, cold or heat intolerance. No polyuria or polydipsia.  .   Physical Examination p 52 bp 128/66 Wt 110  lbs BMI19 Gen: resting comfortably, no acute distress HEENT: no scleral icterus, pupils equal round and reactive, no palptable cervical adenopathy,  CV: RRR, no m/r/g, no JVD, no carotid bruits Resp: Clear to auscultation bilaterally GI: abdomen is soft, non-tender, non-distended, normal bowel sounds, no hepatosplenomegaly MSK: extremities are warm, no edema.  Skin: warm, no rash Neuro:  no focal deficits Psych: appropriate affect   Diagnostic Studies 12/2006 Cath  1. LV 107/9, AO 108/72.  2. Coronaries. Left main had an ostial calcified 30% stenosis. The    LAD had long proximal calcification. There was long proximal 30%  stenosis. There was mid tandem 30% stenosis. The vessel wrapped  the apex. The first diagonal was tiny and normal. The second  diagonal was small and normal. The circumflex had proximal  calcification. There was long proximal 25% stenosis. There was  mid calcified 25% stenosis. There was mid 50-60% stenosis before  an obtuse marginal. Mid obtuse marginal was origin branching.  There was mid 25% stenosis. The right coronary artery was occluded  in the mid segment. There were extensive left-to-right  collaterals.  LEFT VENTRICULOGRAM: The left ventriculogram was obtained in the RAO  projection. The EF was 60% with normal wall motion.  CONCLUSION: Severe single-vessel coronary artery disease with excellent  collateral flow. Nonobstructive extensive disease elsewhere with heavy  calcification. Well-preserved ejection fraction.  PLAN: The patient needs extensive secondary risk reduction. She is  high risk for future cardiovascular events if she does not stop smoking.  She needs a statin. She needs good control of her diabetes. I will  discuss this with Dr. Hawkins.   10/2011 Echo  LVEF 55-60%, no WMAs   10/2013 Echo LVEF 50-55%, hypokinesis of the distalanteroseptal wall, akinesis basal mid-inferolateral walls. Grade I diastolic dysfunction.    09/2012 Carotid  US:  IMPRESSION:  1. Heterogeneous calcified and noncalcified atherosclerotic plaque in the right carotid bulb and proximal right internal carotid artery results in a 50 - 69% diameter stenosis. No significant interval change in the degree of stenosis compared to 10/07/2010 although subjectively there is increased calcification of the plaque.  2. Interval progression of heterogeneous predominately calcified atherosclerotic plaque in the left carotid bulb and proximal internal carotid artery resulting in a greater than 70% diameter stenosis.  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 Abnormal lexiscan MPI. There is a fixed apical defect consistent with prior infarction. There is an inferolateral wall defect with mild peri-infarct ischemia, the interpretation of this defect is limited by significant subdiaphragmatic attenuation adjacent to inferolateral wall. There is hypokinesis of this area, so likely there is a true prior infarct. Interpretation of peri-infarct ischemia of this territory may be limited by subdiaphragmatic attenuation. Correlate findings clinically.  2. Decrased left ventricular systolic function   11/08/13 Clinic EKG - initial ekg wandering baseline, repeated. Repeat EKG shows sinus rhythm, RBBB, old TWI inferior leads.    Assessment and Plan   1. CAD  - old infarct in RCA territory with possible mild peri-infarct ischemia, prior cath described occluded RCA with left to right collaterals supplying this area.  - 09/21/13 Echo shows norma LVEF 50-55% with inferior hypokinesis.  - started imdur last visit when symptoms were more milder, has not helped, and symptoms have actually progressed. She is currently on 2 antianginals - symptoms though not completely typical of cardiac chest pain are concerning for possible progressing angina, low risk features including mild peri-infarct  iscehmia on MPI and normal LVEF, but her symptoms seem to be progressing despite added anti-anginal therapy. Discussed possible admission today for cath however she has many obligations to tend to at home and cannot be admitted at this time. We will arrange an outpatient cath for her, she was counseled and understands if her symptoms progress that she needs to come in to the ER for evaluation and treatment.       Jonathan F. Branch, M.D., F.A.C.C.  

## 2013-11-08 NOTE — Telephone Encounter (Signed)
Pt has MCR and CHAMPVA.  No precert required.

## 2013-11-08 NOTE — Telephone Encounter (Signed)
Called pt and scheduled appointment for pt for Thursday 12-11 at 2:20 PM.

## 2013-11-13 ENCOUNTER — Other Ambulatory Visit: Payer: Self-pay | Admitting: Cardiology

## 2013-11-13 ENCOUNTER — Ambulatory Visit (HOSPITAL_COMMUNITY)
Admission: RE | Admit: 2013-11-13 | Discharge: 2013-11-13 | Disposition: A | Discharge status: Home / Self Care | Payer: Medicare Other | Source: Ambulatory Visit | Attending: Cardiology | Admitting: Cardiology

## 2013-11-13 ENCOUNTER — Encounter (HOSPITAL_COMMUNITY)
Admission: RE | Disposition: A | Discharge status: Home / Self Care | Payer: Self-pay | Source: Ambulatory Visit | Attending: Cardiology

## 2013-11-13 DIAGNOSIS — Z86718 Personal history of other venous thrombosis and embolism: Secondary | ICD-10-CM | POA: Insufficient documentation

## 2013-11-13 DIAGNOSIS — I2510 Atherosclerotic heart disease of native coronary artery without angina pectoris: Secondary | ICD-10-CM

## 2013-11-13 DIAGNOSIS — J449 Chronic obstructive pulmonary disease, unspecified: Secondary | ICD-10-CM | POA: Insufficient documentation

## 2013-11-13 DIAGNOSIS — I2584 Coronary atherosclerosis due to calcified coronary lesion: Secondary | ICD-10-CM | POA: Insufficient documentation

## 2013-11-13 DIAGNOSIS — J4489 Other specified chronic obstructive pulmonary disease: Secondary | ICD-10-CM | POA: Insufficient documentation

## 2013-11-13 DIAGNOSIS — F411 Generalized anxiety disorder: Secondary | ICD-10-CM | POA: Insufficient documentation

## 2013-11-13 DIAGNOSIS — E119 Type 2 diabetes mellitus without complications: Secondary | ICD-10-CM | POA: Insufficient documentation

## 2013-11-13 DIAGNOSIS — E785 Hyperlipidemia, unspecified: Secondary | ICD-10-CM | POA: Insufficient documentation

## 2013-11-13 DIAGNOSIS — I6529 Occlusion and stenosis of unspecified carotid artery: Secondary | ICD-10-CM | POA: Insufficient documentation

## 2013-11-13 DIAGNOSIS — I739 Peripheral vascular disease, unspecified: Secondary | ICD-10-CM | POA: Insufficient documentation

## 2013-11-13 DIAGNOSIS — Z79899 Other long term (current) drug therapy: Secondary | ICD-10-CM | POA: Insufficient documentation

## 2013-11-13 HISTORY — PX: LEFT HEART CATHETERIZATION WITH CORONARY ANGIOGRAM: SHX5451

## 2013-11-13 LAB — BASIC METABOLIC PANEL
BUN: 12 mg/dL (ref 6–23)
Calcium: 9.6 mg/dL (ref 8.4–10.5)
Chloride: 97 mEq/L (ref 96–112)
Creatinine, Ser: 0.65 mg/dL (ref 0.50–1.10)
GFR calc non Af Amer: 89 mL/min — ABNORMAL LOW (ref >90)
Glucose, Bld: 154 mg/dL — ABNORMAL HIGH (ref 70–99)
Sodium: 132 mEq/L — ABNORMAL LOW (ref 135–145)

## 2013-11-13 LAB — CBC
HCT: 38.5 % (ref 36.0–46.0)
MCH: 28.5 pg (ref 26.0–34.0)
MCHC: 34 g/dL (ref 30.0–36.0)
MCV: 83.7 fL (ref 78.0–100.0)
RDW: 13 % (ref 11.5–15.5)

## 2013-11-13 LAB — GLUCOSE, CAPILLARY: Glucose-Capillary: 120 mg/dL — ABNORMAL HIGH (ref 70–99)

## 2013-11-13 LAB — PROTIME-INR: Prothrombin Time: 13 seconds (ref 11.6–15.2)

## 2013-11-13 SURGERY — LEFT HEART CATHETERIZATION WITH CORONARY ANGIOGRAM
Anesthesia: LOCAL

## 2013-11-13 MED ORDER — ACETAMINOPHEN 325 MG PO TABS: 650.0000 mg | ORAL_TABLET | ORAL | Status: DC | PRN

## 2013-11-13 MED ORDER — LIDOCAINE HCL (PF) 1 % IJ SOLN
INTRAMUSCULAR | Status: AC
  Filled 2013-11-13: qty 30

## 2013-11-13 MED ORDER — SODIUM CHLORIDE 0.9 % IV SOLN: 250.0000 mL | INTRAVENOUS | Status: DC | PRN

## 2013-11-13 MED ORDER — NITROGLYCERIN 0.2 MG/ML ON CALL CATH LAB
INTRAVENOUS | Status: AC
  Filled 2013-11-13: qty 1

## 2013-11-13 MED ORDER — ASPIRIN 81 MG PO CHEW
CHEWABLE_TABLET | ORAL | Status: AC
  Filled 2013-11-13: qty 1

## 2013-11-13 MED ORDER — HEPARIN (PORCINE) IN NACL 2-0.9 UNIT/ML-% IJ SOLN
INTRAMUSCULAR | Status: AC
  Filled 2013-11-13: qty 1000

## 2013-11-13 MED ORDER — MIDAZOLAM HCL 2 MG/2ML IJ SOLN
INTRAMUSCULAR | Status: AC
  Filled 2013-11-13: qty 2

## 2013-11-13 MED ORDER — SODIUM CHLORIDE 0.9 % IV SOLN
INTRAVENOUS | Status: DC
  Administered 2013-11-13: 08:00:00 via INTRAVENOUS

## 2013-11-13 MED ORDER — SODIUM CHLORIDE 0.9 % IJ SOLN: 3.0000 mL | INTRAMUSCULAR | Status: DC | PRN

## 2013-11-13 MED ORDER — VERAPAMIL HCL 2.5 MG/ML IV SOLN
INTRAVENOUS | Status: AC
  Filled 2013-11-13: qty 2

## 2013-11-13 MED ORDER — HEPARIN SODIUM (PORCINE) 1000 UNIT/ML IJ SOLN
INTRAMUSCULAR | Status: AC
  Filled 2013-11-13: qty 1

## 2013-11-13 MED ORDER — SODIUM CHLORIDE 0.9 % IJ SOLN: 3.0000 mL | Freq: Two times a day (BID) | INTRAMUSCULAR | Status: DC

## 2013-11-13 MED ORDER — SODIUM CHLORIDE 0.9 % IV SOLN: INTRAVENOUS | Status: AC

## 2013-11-13 MED ORDER — ASPIRIN 81 MG PO CHEW
81.0000 mg | CHEWABLE_TABLET | ORAL | Status: AC
  Administered 2013-11-13: 81 mg via ORAL

## 2013-11-13 MED ORDER — ONDANSETRON HCL 4 MG/2ML IJ SOLN: 4.0000 mg | Freq: Four times a day (QID) | INTRAMUSCULAR | Status: DC | PRN

## 2013-11-13 NOTE — CV Procedure (Signed)
   Cardiac Catheterization Procedure Note  Name: Melissa Holland MRN: [id number] DOB: May 21, 1946  Procedure: Left Heart Cath, Selective Coronary Angiography, LV angiography  Indication:  Unstable angina with known CAD with previously occluded RCA and 60% mid circumflex.  Procedural details: The right radial was prepped, draped, and anesthetized with 1% lidocaine. Using modified Seldinger technique, a 5 French sheath was introduced into the right radial artery. Standard Judkins catheters were used for coronary angiography and left ventriculography. Catheter exchanges were performed over a guidewire. There were no immediate procedural complications. The patient was transferred to the post catheterization recovery area for further monitoring.  Procedural Findings:   Hemodynamics:     AO 118/51    LV 123/27   Coronary angiography:   Coronary dominance: Right  Left mainstem:   Heavy calcification.   No high grade disease  Left anterior descending (LAD):    Long proximal 50%.  I have compared this lesion to the 2008 films and it appears to be unchanged.  The LAD is a very large vessel wrapping the apex.  The mid LAD has 30% stenosis.  There are diffuse luminal irregularities.  This is a mid to distal moderate to large diagonal vessel that has mild luminal irregularities.   Left circumflex (LCx):  The circumflex has had worsening stenosis with now occlusion at the ostium of a large branching OM.  This OM has severe diffuse disease proximal to the bifurcation.  The vessel after this is free of high grade disease.    Right coronary artery (RCA):  Occluded in the mid segment.    Left ventriculography: Left ventricular systolic function is mildly reduced with , LVEF is estimated at 50%, there is no significant mitral regurgitation   Final Conclusions:   Two vessel occlusion with moderate disease in the proximal LAD.    Recommendations:   The patient would need to be considered for CABG if  she has continued chest pain.  Discussed with Dr. Wyline Mood.   Rollene Rotunda 11/13/2013, 10:28 AM

## 2013-11-13 NOTE — Interval H&P Note (Signed)
Cath Lab Visit (complete for each Cath Lab visit)  Clinical Evaluation Leading to the Procedure:   ACS: no  Non-ACS:    Anginal Classification: CCS IV  Anti-ischemic medical therapy: Maximal Therapy (2 or more classes of medications)  Non-Invasive Test Results: No non-invasive testing performed  Prior CABG: No previous CABG      History and Physical Interval Note:  11/13/2013 9:59 AM  Melissa Holland  has presented today for surgery, with the diagnosis of Chest pain  The various methods of treatment have been discussed with the patient and family. After consideration of risks, benefits and other options for treatment, the patient has consented to  Procedure(s): LEFT HEART CATHETERIZATION WITH CORONARY ANGIOGRAM (N/A) as a surgical intervention .  The patient's history has been reviewed, patient examined, no change in status, stable for surgery.  I have reviewed the patient's chart and labs.  Questions were answered to the patient's satisfaction.     Rollene Rotunda

## 2013-11-13 NOTE — H&P (View-Only) (Signed)
Clinical Summary Ms. Bernardi is a 67 y.o.female comes in today as an add on appontment, this is a focused visit on her CAD. For more detailed visit see my note 09/21/13   1. CAD  - cath 2008 with occluded RCA with good left to right collaterals, diffuse non-obstructive disease otherwise  - LVEF 55-60% by echo in 2012  - 09/21/13 MPI inferior infarct, technically difficult to evaluate peri-infarct ischemia but appears to be mild   - reports chest pain is increasing in severity over the last few weeks. Feels heart thumping, then throbbing pain left chest up to left neck to left jaw. Pain is moderate. + SOB + Lightheadedness. Often brought on with vacuuming, mopping, or changing sheets it can occur. Notes increase in frequency in symptoms since weather has gotten colder. She has not taken any NG because cause headaches. Can sometimes be improved with tums. Symptoms occur every 2-3 days.  Associated with generalized fatigue. Increased DOE.  - last visit started on imdur without significant improvement.  Past Medical History  Diagnosis Date  . Diabetes mellitus   . Coronary artery disease     Severe single-vessel/100% occluded right with distal collateralization.Medical therapy/Normal LV fx  . Carotid bruit     Bilateral  . Sinus bradycardia   . Dyslipidemia   . DVT (deep venous thrombosis)     Remote portable as him   . COPD (chronic obstructive pulmonary disease)   . Arthritis   . Depression with anxiety   . Leg pain   . Productive cough   . Peripheral arterial disease   . Carotid artery occlusion      Allergies  Allergen Reactions  . Penicillins Anaphylaxis  . Sulfonamide Derivatives Anaphylaxis  . Alupent [Metaproterenol Sulfate] Other (See Comments)    Makes pt "nervous"  . Azithromycin Other (See Comments)    unknown  . Omnipaque [Iohexol] Other (See Comments)    unknown  . Paroxetine Other (See Comments)    unknown  . Varenicline Tartrate Other (See Comments)    unknown  . Benadryl [Diphenhydramine Hcl] Other (See Comments)    Can't breath  . Hydrocortisone Hives  . Prednisone Hives     Current Outpatient Prescriptions  Medication Sig Dispense Refill  . albuterol (2.5 MG/3ML) 0.083% NEBU 3 mL, albuterol (5 MG/ML) 0.5% NEBU 0.5 mL Inhale 2.5 mg into the lungs every 6 (six) hours as needed.       Marland Kitchen albuterol (VENTOLIN HFA) 108 (90 BASE) MCG/ACT inhaler Inhale 2 puffs into the lungs every 6 (six) hours as needed.      . ALPRAZolam (XANAX) 1 MG tablet Take 1 mg by mouth 4 (four) times daily as needed. For nerves      . aspirin 81 MG tablet Take 81 mg by mouth daily.        . bisacodyl (DULCOLAX) 5 MG EC tablet Take 20 mg by mouth daily as needed. For bowel movement.      . furosemide (LASIX) 20 MG tablet Take 20 mg by mouth as needed.      . isosorbide mononitrate (IMDUR) 30 MG 24 hr tablet Take 1 tablet (30 mg total) by mouth daily.  90 tablet  3  . metFORMIN (GLUCOPHAGE) 500 MG tablet Take 1,000 mg by mouth 2 (two) times daily with a meal.        . metoprolol succinate (TOPROL-XL) 25 MG 24 hr tablet Take 12.5 mg by mouth every morning.       Marland Kitchen  nitroGLYCERIN (NITROSTAT) 0.4 MG SL tablet Place 1 tablet (0.4 mg total) under the tongue every 5 (five) minutes as needed. For chest pain.  25 tablet  3  . oxyCODONE-acetaminophen (ENDOCET) 10-325 MG per tablet Take 1 tablet by mouth every 4 (four) hours as needed for pain.      . potassium chloride (KLOR-CON) 10 MEQ CR tablet Take 10 mEq by mouth as needed. When taking Furosemide      . simvastatin (ZOCOR) 40 MG tablet Take 1 tablet (40 mg total) by mouth every evening.  90 tablet  3   No current facility-administered medications for this visit.     Past Surgical History  Procedure Laterality Date  . Parotidectomy      Left  . Shoulder blade      Cysts x 2  . Tonsillectomy and adenoidectomy    . Partial hysterectomy      Subsequent completion later that year,hx of large fibroid tumor  .  Appendectomy    . Aortogram      with bilateral lower extremity run-off  . Abdominal hysterectomy       Allergies  Allergen Reactions  . Penicillins Anaphylaxis  . Sulfonamide Derivatives Anaphylaxis  . Alupent [Metaproterenol Sulfate] Other (See Comments)    Makes pt "nervous"  . Azithromycin Other (See Comments)    unknown  . Omnipaque [Iohexol] Other (See Comments)    unknown  . Paroxetine Other (See Comments)    unknown  . Varenicline Tartrate Other (See Comments)    unknown  . Benadryl [Diphenhydramine Hcl] Other (See Comments)    Can't breath  . Hydrocortisone Hives  . Prednisone Hives      Family History  Problem Relation Age of Onset  . Cancer Maternal Grandmother     liver  . Cancer Maternal Grandfather     lung  . Diabetes Mother   . Diabetes Son      Social History Ms. Worsley reports that she has been smoking Cigarettes.  She has a 52 pack-year smoking history. She has never used smokeless tobacco. Ms. Hassing reports that she does not drink alcohol.   Review of Systems CONSTITUTIONAL: No weight loss, fever, chills, weakness or fatigue.  HEENT: Eyes: No visual loss, blurred vision, double vision or yellow sclerae.No hearing loss, sneezing, congestion, runny nose or sore throat.  SKIN: No rash or itching.  CARDIOVASCULAR: per HPI RESPIRATORY: per HPI  GASTROINTESTINAL: No anorexia, nausea, vomiting or diarrhea. No abdominal pain or blood.  GENITOURINARY: No burning on urination, no polyuria NEUROLOGICAL: No headache, dizziness, syncope, paralysis, ataxia, numbness or tingling in the extremities. No change in bowel or bladder control.  MUSCULOSKELETAL: No muscle, back pain, joint pain or stiffness.  LYMPHATICS: No enlarged nodes. No history of splenectomy.  PSYCHIATRIC: No history of depression or anxiety.  ENDOCRINOLOGIC: No reports of sweating, cold or heat intolerance. No polyuria or polydipsia.  Marland Kitchen   Physical Examination p 52 bp 128/66 Wt 110  lbs BMI19 Gen: resting comfortably, no acute distress HEENT: no scleral icterus, pupils equal round and reactive, no palptable cervical adenopathy,  CV: RRR, no m/r/g, no JVD, no carotid bruits Resp: Clear to auscultation bilaterally GI: abdomen is soft, non-tender, non-distended, normal bowel sounds, no hepatosplenomegaly MSK: extremities are warm, no edema.  Skin: warm, no rash Neuro:  no focal deficits Psych: appropriate affect   Diagnostic Studies 12/2006 Cath  1. LV 107/9, AO 108/72.  2. Coronaries. Left main had an ostial calcified 30% stenosis. The  LAD had long proximal calcification. There was long proximal 30%  stenosis. There was mid tandem 30% stenosis. The vessel wrapped  the apex. The first diagonal was tiny and normal. The second  diagonal was small and normal. The circumflex had proximal  calcification. There was long proximal 25% stenosis. There was  mid calcified 25% stenosis. There was mid 50-60% stenosis before  an obtuse marginal. Mid obtuse marginal was origin branching.  There was mid 25% stenosis. The right coronary artery was occluded  in the mid segment. There were extensive left-to-right  collaterals.  LEFT VENTRICULOGRAM: The left ventriculogram was obtained in the RAO  projection. The EF was 60% with normal wall motion.  CONCLUSION: Severe single-vessel coronary artery disease with excellent  collateral flow. Nonobstructive extensive disease elsewhere with heavy  calcification. Well-preserved ejection fraction.  PLAN: The patient needs extensive secondary risk reduction. She is  high risk for future cardiovascular events if she does not stop smoking.  She needs a statin. She needs good control of her diabetes. I will  discuss this with Dr. [name].   10/2011 Echo  LVEF 55-60%, no WMAs   10/2013 Echo LVEF 50-55%, hypokinesis of the distalanteroseptal wall, akinesis basal mid-inferolateral walls. Grade I diastolic dysfunction.    09/2012 Carotid  US:  IMPRESSION:  1. Heterogeneous calcified and noncalcified atherosclerotic plaque in the right carotid bulb and proximal right internal carotid artery results in a 50 - 69% diameter stenosis. No significant interval change in the degree of stenosis compared to 10/07/2010 although subjectively there is increased calcification of the plaque.  2. Interval progression of heterogeneous predominately calcified atherosclerotic plaque in the left carotid bulb and proximal internal carotid artery resulting in a greater than 70% diameter stenosis.  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 Abnormal lexiscan MPI. There is a fixed apical defect consistent with prior infarction. There is an inferolateral wall defect with mild peri-infarct ischemia, the interpretation of this defect is limited by significant subdiaphragmatic attenuation adjacent to inferolateral wall. There is hypokinesis of this area, so likely there is a true prior infarct. Interpretation of peri-infarct ischemia of this territory may be limited by subdiaphragmatic attenuation. Correlate findings clinically.  2. Decrased left ventricular systolic function   11/08/13 Clinic EKG - initial ekg wandering baseline, repeated. Repeat EKG shows sinus rhythm, RBBB, old TWI inferior leads.    Assessment and Plan   1. CAD  - old infarct in RCA territory with possible mild peri-infarct ischemia, prior cath described occluded RCA with left to right collaterals supplying this area.  - 09/21/13 Echo shows norma LVEF 50-55% with inferior hypokinesis.  - started imdur last visit when symptoms were more milder, has not helped, and symptoms have actually progressed. She is currently on 2 antianginals - symptoms though not completely typical of cardiac chest pain are concerning for possible progressing angina, low risk features including mild peri-infarct  iscehmia on MPI and normal LVEF, but her symptoms seem to be progressing despite added anti-anginal therapy. Discussed possible admission today for cath however she has many obligations to tend to at home and cannot be admitted at this time. We will arrange an outpatient cath for her, she was counseled and understands if her symptoms progress that she needs to come in to the ER for evaluation and treatment.       Antoine Poche, M.D., F.A.C.C.

## 2013-12-19 ENCOUNTER — Ambulatory Visit: Payer: Medicare Other | Admitting: Cardiology

## 2013-12-31 ENCOUNTER — Encounter: Payer: Self-pay | Admitting: Cardiology

## 2013-12-31 ENCOUNTER — Ambulatory Visit (INDEPENDENT_AMBULATORY_CARE_PROVIDER_SITE_OTHER): Payer: Medicare Other | Admitting: Cardiology

## 2013-12-31 ENCOUNTER — Encounter: Payer: Self-pay | Admitting: Internal Medicine

## 2013-12-31 VITALS — BP 129/68 | HR 62 | Ht 63.0 in | Wt 111.4 lb

## 2013-12-31 DIAGNOSIS — I2510 Atherosclerotic heart disease of native coronary artery without angina pectoris: Secondary | ICD-10-CM

## 2013-12-31 NOTE — Patient Instructions (Signed)
Your physician recommends that you schedule a follow-up appointment in: 3 months. Your physician has recommended you make the following change in your medication: Stop isosorbide mononitrate. All other medications will remain the same.

## 2013-12-31 NOTE — Progress Notes (Signed)
Clinical Summary Melissa Holland is a 68 y.o.female seen today for follow up of the following medical problems.   1. CAD  - cath 2008 with occluded RCA with good left to right collaterals, diffuse non-obstructive disease otherwise  - LVEF 55-60% by echo in 2012  - referred for cath 10/2013 in setting of chest pain not improved with intensified antianginal therapy and abnormal stress test, showed occlusion of OM at ostium and also occluded RCA (chronic RCA occlusion from prior films). Not amenable to PCI per discussions with cath physician, would need to be considered for CABG if clinically her pain could not be controlled.  - since last visit pain has become more consistent with GI etiology, better with belching and tums. Denies any other current chest pain.    Past Medical History  Diagnosis Date  . Diabetes mellitus   . Coronary artery disease     Severe single-vessel/100% occluded right with distal collateralization.Medical therapy/Normal LV fx  . Carotid bruit     Bilateral  . Sinus bradycardia   . Dyslipidemia   . DVT (deep venous thrombosis)     Remote portable as him   . COPD (chronic obstructive pulmonary disease)   . Arthritis   . Depression with anxiety   . Leg pain   . Productive cough   . Peripheral arterial disease   . Carotid artery occlusion      Allergies  Allergen Reactions  . Penicillins Anaphylaxis  . Sulfonamide Derivatives Anaphylaxis  . Alupent [Metaproterenol Sulfate] Other (See Comments)    Makes pt "nervous"  . Azithromycin Other (See Comments)    unknown  . Omnipaque [Iohexol] Other (See Comments)    unknown  . Paroxetine Other (See Comments)    unknown  . Varenicline Tartrate Other (See Comments)    unknown  . Benadryl [Diphenhydramine Hcl] Other (See Comments)    Can't breath  . Hydrocortisone Hives  . Prednisone Hives     Current Outpatient Prescriptions  Medication Sig Dispense Refill  . albuterol (2.5 MG/3ML) 0.083% NEBU 3 mL,  albuterol (5 MG/ML) 0.5% NEBU 0.5 mL Inhale 2.5 mg into the lungs every 6 (six) hours as needed.       Marland Kitchen albuterol (VENTOLIN HFA) 108 (90 BASE) MCG/ACT inhaler Inhale 2 puffs into the lungs every 6 (six) hours as needed.      . ALPRAZolam (XANAX) 1 MG tablet Take 0.5 mg by mouth 4 (four) times daily as needed. For nerves      . aspirin 81 MG tablet Take 81 mg by mouth daily.        . bisacodyl (DULCOLAX) 5 MG EC tablet Take 20 mg by mouth daily as needed. For bowel movement.      . furosemide (LASIX) 20 MG tablet Take 20 mg by mouth as needed.      . isosorbide mononitrate (IMDUR) 30 MG 24 hr tablet Take 1 tablet (30 mg total) by mouth daily.  90 tablet  3  . metFORMIN (GLUCOPHAGE) 500 MG tablet Take 1,000 mg by mouth 2 (two) times daily with a meal.        . metoprolol succinate (TOPROL-XL) 25 MG 24 hr tablet Take 12.5 mg by mouth every morning.       . nitroGLYCERIN (NITROSTAT) 0.4 MG SL tablet Place 1 tablet (0.4 mg total) under the tongue every 5 (five) minutes as needed. For chest pain.  25 tablet  3  . oxyCODONE-acetaminophen (ENDOCET) 10-325 MG per tablet  Take 0.5-1 tablets by mouth every 4 (four) hours as needed for pain.       . potassium chloride (KLOR-CON) 10 MEQ CR tablet Take 10 mEq by mouth as needed. When taking Furosemide      . simvastatin (ZOCOR) 40 MG tablet Take 1 tablet (40 mg total) by mouth every evening.  90 tablet  3   No current facility-administered medications for this visit.     Past Surgical History  Procedure Laterality Date  . Parotidectomy      Left  . Shoulder blade      Cysts x 2  . Tonsillectomy and adenoidectomy    . Partial hysterectomy      Subsequent completion later that year,hx of large fibroid tumor  . Appendectomy    . Aortogram      with bilateral lower extremity run-off  . Abdominal hysterectomy       Allergies  Allergen Reactions  . Penicillins Anaphylaxis  . Sulfonamide Derivatives Anaphylaxis  . Alupent [Metaproterenol Sulfate]  Other (See Comments)    Makes pt "nervous"  . Azithromycin Other (See Comments)    unknown  . Omnipaque [Iohexol] Other (See Comments)    unknown  . Paroxetine Other (See Comments)    unknown  . Varenicline Tartrate Other (See Comments)    unknown  . Benadryl [Diphenhydramine Hcl] Other (See Comments)    Can't breath  . Hydrocortisone Hives  . Prednisone Hives      Family History  Problem Relation Age of Onset  . Cancer Maternal Grandmother     liver  . Cancer Maternal Grandfather     lung  . Diabetes Mother   . Diabetes Son      Social History Melissa Holland reports that she has been smoking Cigarettes.  She has a 52 pack-year smoking history. She has never used smokeless tobacco. Melissa Holland reports that she does not drink alcohol.   Review of Systems CONSTITUTIONAL: No weight loss, fever, chills, weakness or fatigue.  HEENT: Eyes: No visual loss, blurred vision, double vision or yellow sclerae.No hearing loss, sneezing, congestion, runny nose or sore throat.  SKIN: No rash or itching.  CARDIOVASCULAR: per HPI RESPIRATORY: No shortness of breath, cough or sputum.  GASTROINTESTINAL: No anorexia, nausea, vomiting or diarrhea. No abdominal pain or blood.  GENITOURINARY: No burning on urination, no polyuria NEUROLOGICAL: No headache, dizziness, syncope, paralysis, ataxia, numbness or tingling in the extremities. No change in bowel or bladder control.  MUSCULOSKELETAL: chronic diffuse arthritic joint pain LYMPHATICS: No enlarged nodes. No history of splenectomy.  PSYCHIATRIC: No history of depression or anxiety.  ENDOCRINOLOGIC: No reports of sweating, cold or heat intolerance. No polyuria or polydipsia.  Marland Kitchen.   Physical Examination p 62 bp 129/68 Wt 11 lbs BMI 20 Gen: resting comfortably, no acute distress HEENT: no scleral icterus, pupils equal round and reactive, no palptable cervical adenopathy,  CV: RRR, no m/r/g,no JVD, no carotid bruit Resp: Clear to auscultation  bilaterally GI: abdomen is soft, non-tender, non-distended, normal bowel sounds, no hepatosplenomegaly MSK: extremities are warm, no edema.  Skin: warm, no rash Neuro:  no focal deficits Psych: appropriate affect   Diagnostic Studies 10/2011 Echo  LVEF 55-60%, no WMAs   10/2013 Echo  LVEF 50-55%, hypokinesis of the distalanteroseptal wall, akinesis basal mid-inferolateral walls. Grade I diastolic dysfunction.   09/2012 Carotid US:  IMPRESSION:  1. Heterogeneous calcified and noncalcified atherosclerotic plaque in the right carotid bulb and proximal right internal carotid artery results in a  50 - 69% diameter stenosis. No significant interval change in the degree of stenosis compared to 10/07/2010 although subjectively there is increased calcification of the plaque.  2. Interval progression of heterogeneous predominately calcified atherosclerotic plaque in the left carotid bulb and proximal internal carotid artery resulting in a greater than 70% diameter stenosis.  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 Abnormal lexiscan MPI. There is a fixed apical defect consistent with prior infarction. There is an inferolateral wall defect with mild peri-infarct ischemia, the interpretation of this defect is limited by significant subdiaphragmatic attenuation adjacent to inferolateral wall. There is hypokinesis of this area, so likely there is a true prior infarct. Interpretation of peri-infarct ischemia of this territory may be limited by subdiaphragmatic attenuation. Correlate findings clinically.  2. Decrased left ventricular systolic function  11/08/13 Clinic EKG  - initial ekg wandering baseline, repeated. Repeat EKG shows sinus rhythm, RBBB, old TWI inferior leads.   10/2013 Cath Left mainstem: Heavy calcification. No high grade disease  Left anterior descending (LAD): Long proximal 50%. I have  compared this lesion to the 2008 films and it appears to be unchanged. The LAD is a very large vessel wrapping the apex. The mid LAD has 30% stenosis. There are diffuse luminal irregularities. This is a mid to distal moderate to large diagonal vessel that has mild luminal irregularities.  Left circumflex (LCx): The circumflex has had worsening stenosis with now occlusion at the ostium of a large branching OM. This OM has severe diffuse disease proximal to the bifurcation. The vessel after this is free of high grade disease.  Right coronary artery (RCA): Occluded in the mid segment.  Left ventriculography: Left ventricular systolic function is mildly reduced with , LVEF is estimated at 50%, there is no significant mitral regurgitation  Final Conclusions: Two vessel occlusion with moderate disease in the proximal LAD.  Recommendations: The patient would need to be considered for CABG if she has continued chest pain. Discussed with Dr. Wyline Mood.   Pertinent Labs 05/2013: TC 163 TG 50 HDL 92 LDL 61  Assessment and Plan   1. CAD  - prior MPI with inferolateral defect with possible peri-infarct ischemia thought technically difficult to evalute, cath shows chronically occluded RCA along with worsening disease of large OM now with occlusion. From discussion with cath cardiologist if peristent cardiac chest pain not controlled with medical therapy would consider evaluating her for CABG though she would be high risk due to multiple comorbidities.  - symptoms have evolved and now more GI in nature, will continue to treat CAD medically - she asks to stop imdur as she does not think it has helped, will discontinue   Follow up 3 months to follow symptoms.      Antoine Poche, M.D., F.A.C.C.

## 2014-01-18 ENCOUNTER — Ambulatory Visit: Payer: Medicare Other | Admitting: Gastroenterology

## 2014-01-23 ENCOUNTER — Telehealth: Payer: Self-pay | Admitting: *Deleted

## 2014-01-23 NOTE — Telephone Encounter (Signed)
Patient notified.  States she will have to get friend to take her this evening after 4 after he gets out of work.  Stated that she may just have to wait till tomorrow morning.  Stated that she had kid size shovel that she cleared a path with to get to her car so she could get out to get some groceries.  Strongly encouraged her to call 911 immediately if symptoms worsen before then.  Also, reminded her that if she has to take her NTG again to call 911 if chest pain/tightness is not relieved after 2 tabs.  She verbalized understanding.

## 2014-01-23 NOTE — Telephone Encounter (Signed)
Left message to return call 

## 2014-01-23 NOTE — Telephone Encounter (Signed)
Recommend patient gets evaluated in ER. She has known significant CAD, with new symptoms this may have progressed.    Dina RichJonathan Alayia Meggison MD

## 2014-01-23 NOTE — Telephone Encounter (Signed)
Patient had to run to store but she is home for good now. You can call her back

## 2014-01-23 NOTE — Telephone Encounter (Signed)
Patient stated she was shoveling snow last week.  Not feeling good x 4 days now.  Tightness in chest, weakness, SOB - mostly with activity.      BP - 118/71  P - 65  02 sat - 98%  Glucose reading this morning - 127.  Stated that she did take one NTG last Thursday & tightness was relieved.  Stated she also has used her Albuterol MDI which has helped also.  Last seen 12/31/2013 - her Imdur was d/c'd at this visit.

## 2014-01-31 ENCOUNTER — Ambulatory Visit: Payer: Medicare Other | Admitting: Gastroenterology

## 2014-02-07 ENCOUNTER — Telehealth: Payer: Self-pay | Admitting: Cardiology

## 2014-02-07 NOTE — Telephone Encounter (Signed)
Patient is requesting that a letter be written on her behalf stating that she is unable to physically take care of herself any longer.  This is so [place] can get [place] from [place] to help pay [facility].   Dr Juanetta GoslingHawkins has already written one letter for her in this matter.

## 2014-02-11 NOTE — Telephone Encounter (Signed)
Please let patient know that I am out of the office this week and to follow up with us next week regarding her letter.  Dina RichJonathan Christell Steinmiller MD

## 2014-02-12 NOTE — Telephone Encounter (Signed)
Informed pt and pt verbalized understanding.

## 2014-03-06 ENCOUNTER — Ambulatory Visit (INDEPENDENT_AMBULATORY_CARE_PROVIDER_SITE_OTHER): Payer: Medicare Other | Admitting: Gastroenterology

## 2014-03-06 ENCOUNTER — Encounter: Payer: Self-pay | Admitting: Gastroenterology

## 2014-03-06 ENCOUNTER — Encounter (INDEPENDENT_AMBULATORY_CARE_PROVIDER_SITE_OTHER): Payer: Self-pay

## 2014-03-06 VITALS — BP 145/66 | HR 61 | Temp 98.4°F | Ht 63.0 in | Wt 110.6 lb

## 2014-03-06 DIAGNOSIS — R6881 Early satiety: Secondary | ICD-10-CM | POA: Insufficient documentation

## 2014-03-06 DIAGNOSIS — R634 Abnormal weight loss: Secondary | ICD-10-CM | POA: Insufficient documentation

## 2014-03-06 DIAGNOSIS — K429 Umbilical hernia without obstruction or gangrene: Secondary | ICD-10-CM | POA: Insufficient documentation

## 2014-03-06 DIAGNOSIS — R300 Dysuria: Secondary | ICD-10-CM

## 2014-03-06 DIAGNOSIS — R1031 Right lower quadrant pain: Secondary | ICD-10-CM | POA: Insufficient documentation

## 2014-03-06 DIAGNOSIS — F5000 Anorexia nervosa, unspecified: Secondary | ICD-10-CM | POA: Insufficient documentation

## 2014-03-06 DIAGNOSIS — K219 Gastro-esophageal reflux disease without esophagitis: Secondary | ICD-10-CM

## 2014-03-06 DIAGNOSIS — R112 Nausea with vomiting, unspecified: Secondary | ICD-10-CM | POA: Insufficient documentation

## 2014-03-06 DIAGNOSIS — I2510 Atherosclerotic heart disease of native coronary artery without angina pectoris: Secondary | ICD-10-CM

## 2014-03-06 LAB — CBC WITH DIFFERENTIAL/PLATELET
BASOS ABS: 0 10*3/uL (ref 0.0–0.1)
BASOS PCT: 0 % (ref 0–1)
Eosinophils Absolute: 0.1 10*3/uL (ref 0.0–0.7)
Eosinophils Relative: 2 % (ref 0–5)
HCT: 39.9 % (ref 36.0–46.0)
HEMOGLOBIN: 13.4 g/dL (ref 12.0–15.0)
Lymphocytes Relative: 24 % (ref 12–46)
Lymphs Abs: 1.3 10*3/uL (ref 0.7–4.0)
MCH: 27.6 pg (ref 26.0–34.0)
MCHC: 33.6 g/dL (ref 30.0–36.0)
MCV: 82.1 fL (ref 78.0–100.0)
MONOS PCT: 10 % (ref 3–12)
Monocytes Absolute: 0.5 10*3/uL (ref 0.1–1.0)
NEUTROS ABS: 3.4 10*3/uL (ref 1.7–7.7)
NEUTROS PCT: 64 % (ref 43–77)
Platelets: 206 10*3/uL (ref 150–400)
RBC: 4.86 MIL/uL (ref 3.87–5.11)
RDW: 14.7 % (ref 11.5–15.5)
WBC: 5.3 10*3/uL (ref 4.0–10.5)

## 2014-03-06 NOTE — Assessment & Plan Note (Addendum)
C/O postprandial abdominal pain associated with early satiety, intermittent N/V, heartburn, anorexia. Weight now stable but with unintentional weight loss over past 18 months of at least 15 pounds. Significant history of peripheral vascular disease as detailed above. Would be concerned about mesenteric ischemia. Also consider intermittent obstruction from umbilical hernia. Cannot rule out PUD, gastritis, malignancy. Patient with multiple comorbidities. She continues to smoke.   C/o dysuria/urinary hesitancy.  Get labs and urine with culture. CT A/P with contrast as initial step. Further recommendations to follow.

## 2014-03-06 NOTE — Patient Instructions (Signed)
1. CT scan as discussed. Hold metformin three days prior and three days after study. 2. Please have your labs done today. 3. Urine specimen to lab.

## 2014-03-06 NOTE — Progress Notes (Signed)
Primary Care Physician:  Fredirick Maudlin, MD  Primary Gastroenterologist:  Roetta Sessions, MD   Chief Complaint  Patient presents with  . Constipation    HPI:  Melissa Holland is a 68 y.o. female here for further evaluation of constipation and abdominal pain. Referred back to Dr. [name]. Last seen by our practice at time of colonoscopy in 2012. Noted to have greater than 15 pound weight loss since that time although she tells me her weight has been stable the last few months. Tends to run in the 105-110 range, over the past 6 months. She reports that over 10 years ago she weighed 170 pounds and that is when she started seeing a nutritionist for unexplained weight loss. She's very concerned about having no body fat or muscle.  Postprandial discomfort, feels like fills up quickly, feels like going to throw up. Place in RLQ region swells up like a sweet potato. High fiber and high protein diet. Will have small amount of stool after meals. Takes TUMS for heartburn. Some dysphagia with foods at times. No appetite. Eats good breakfast, egg/luncheon meat 1/2 sandwich, oatmeal. Eats dinner. No lunch. BM after eats meal, small amount, gassy. Abdominal bloating/distention.  Hurts around umbilical hernia.   Significant peripheral artery and coronary artery disease. Followed closely by Dr. Wyline Mood and Dr. Fabienne Bruns. Overdue for f/u with Dr. Darrick Penna.     Difficult to empty bladder. Complains of diffuse itching.   Followed by Dr. Fabienne Bruns. 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 Patent superior mesenteric artery. Subsequently underwent mesenteric angiogram that showed 40% celiac stenosis, 0% SMA stenosis, 40% IMA stenosis. She had 90% stenosis of the left common femoral artery and a left tibial peroneal, and anterior tibial artery. Also had a right lower showed a superficial femoral artery occlusion and occlusion of the right tibial peroneal trunk.  This was done in December 2012. No stenting at that time.  Last cardiac catheterization December 2014, 2 vessel occlusion with moderate disease in the proximal LAD. CABG is being considered if continued chest pain.  Popped a Xanax in middle of OV due to "panic attack". No apparent distress although quite she is quite flighty historian.  Current Outpatient Prescriptions  Medication Sig Dispense Refill  . albuterol (2.5 MG/3ML) 0.083% NEBU 3 mL, albuterol (5 MG/ML) 0.5% NEBU 0.5 mL Inhale 2.5 mg into the lungs every 6 (six) hours as needed.       Marland Kitchen albuterol (VENTOLIN HFA) 108 (90 BASE) MCG/ACT inhaler Inhale 2 puffs into the lungs every 6 (six) hours as needed.      . ALPRAZolam (XANAX) 1 MG tablet Take 0.5 mg by mouth 4 (four) times daily as needed. For nerves      . aspirin 81 MG tablet Take 81 mg by mouth daily.        . bisacodyl (DULCOLAX) 5 MG EC tablet Take 20 mg by mouth daily as needed. For bowel movement.      . furosemide (LASIX) 20 MG tablet Take 20 mg by mouth as needed.      . metFORMIN (GLUCOPHAGE) 500 MG tablet Take 500 mg by mouth 2 (two) times daily with a meal.       . metoprolol succinate (TOPROL-XL) 25 MG 24 hr tablet Take 12.5 mg by mouth every morning.       . nitroGLYCERIN (NITROSTAT) 0.4 MG SL tablet Place 1 tablet (0.4 mg total) under the tongue every 5 (five) minutes as  needed. For chest pain.  25 tablet  3  . oxyCODONE-acetaminophen (ENDOCET) 10-325 MG per tablet Take 0.5-1 tablets by mouth every 4 (four) hours as needed for pain.       . potassium chloride (KLOR-CON) 10 MEQ CR tablet Take 10 mEq by mouth as needed. When taking Furosemide      . simvastatin (ZOCOR) 40 MG tablet Take 1 tablet (40 mg total) by mouth every evening.  90 tablet  3   No current facility-administered medications for this visit.    Allergies as of 03/06/2014 - Review Complete 03/06/2014  Allergen Reaction Noted  . Penicillins Anaphylaxis   . Sulfonamide derivatives Anaphylaxis   .  Alupent [metaproterenol sulfate] Other (See Comments) 09/10/2011  . Azithromycin Other (See Comments)   . Omnipaque [iohexol] Other (See Comments) 09/10/2011  . Paroxetine Other (See Comments) 04/09/2010  . Varenicline tartrate Other (See Comments) 04/09/2010  . Benadryl [diphenhydramine hcl] Other (See Comments) 03/20/2012  . Hydrocortisone Hives 03/20/2012  . Prednisone Hives 03/20/2012    Past Medical History  Diagnosis Date  . Diabetes mellitus   . Coronary artery disease     Severe single-vessel/100% occluded right with distal collateralization.Medical therapy/Normal LV fx  . Carotid bruit     Bilateral  . Sinus bradycardia   . Dyslipidemia   . DVT (deep venous thrombosis)     Remote portable as him   . COPD (chronic obstructive pulmonary disease)   . Arthritis   . Depression with anxiety   . Leg pain   . Productive cough   . Peripheral arterial disease   . Carotid artery occlusion     Past Surgical History  Procedure Laterality Date  . Parotidectomy      Left  . Shoulder blade      Cysts x 2  . Tonsillectomy and adenoidectomy    . Partial hysterectomy      Subsequent completion later that year,hx of large fibroid tumor  . Appendectomy    . Aortogram      with bilateral lower extremity run-off  . Abdominal hysterectomy    . Colonoscopy  12/31/2010    GNF:AOZHRMR:Long tortuous, but otherwise normal-appearing colon/ Normal rectum    Family History  Problem Relation Age of Onset  . Cancer Maternal Grandmother     liver  . Cancer Maternal Grandfather     lung  . Diabetes Mother   . Diabetes Son     History   Social History  . Marital Status: Widowed    Spouse Name: N/A    Number of Children: N/A  . Years of Education: N/A   Occupational History  . Not on file.   Social History Main Topics  . Smoking status: Current Every Day Smoker -- 1.00 packs/day for 52 years    Types: Cigarettes  . Smokeless tobacco: Never Used     Comment: states not smoking as  much  . Alcohol Use: No  . Drug Use: No  . Sexual Activity: Not on file   Other Topics Concern  . Not on file   Social History Narrative  . No narrative on file      ROS:  General: see hpi Eyes: Negative for vision changes.  ENT: Negative for hoarseness, difficulty swallowing , nasal congestion. CV: Negative for chest pain, angina, palpitations, dyspnea on exertion, peripheral edema.  Respiratory: Negative for dyspnea at rest, dyspnea on exertion, cough, sputum, wheezing.  GI: See history of present illness. GU:  Negative for hematuria,  urinary incontinence, urinary frequency, nocturnal urination. C/o dysuria and hesitancy.  MS: Negative for joint pain, low back pain. Claudication.  Derm: Negative for rash or itching.  Neuro: Negative for weakness, abnormal sensation, seizure, frequent headaches, memory loss, confusion.  Psych: Negative for depression, suicidal ideation, hallucinations. +anxiety Endo: Negative for unusual weight change.  Heme: Negative for bruising or bleeding. Allergy: Negative for rash or hives.    Physical Examination:  BP 145/66  Pulse 61  Temp(Src) 98.4 F (36.9 C) (Oral)  Ht 5\' 3"  (1.6 m)  Wt 110 lb 9.6 oz (50.168 kg)  BMI 19.60 kg/m2   General: Well-nourished, well-developed in no acute distress. Very talkative and hard to focus. Head: Normocephalic, atraumatic.   Eyes: Conjunctiva pink, no icterus. Mouth: Oropharyngeal mucosa moist and pink , no lesions erythema or exudate. Neck: Supple without thyromegaly, masses, or lymphadenopathy.  Lungs: Clear to auscultation bilaterally.  Heart: Regular rate and rhythm, no murmurs rubs or gallops.  Abdomen: Bowel sounds are normal, nondistended, no hepatosplenomegaly or masses, no abdominal bruit, no rebound or guarding.  Tender at site of umbilical hernia and rlq. Hernia easily reducible. Thin abdominal wall.  Rectal: not performed Extremities: No lower extremity edema. No clubbing or deformities.   Neuro: Alert and oriented x 4 , grossly normal neurologically.  Skin: Warm and dry, no rash or jaundice.   Psych: Alert and cooperative, normal mood and affect.

## 2014-03-07 LAB — URINALYSIS W MICROSCOPIC + REFLEX CULTURE
Bilirubin Urine: NEGATIVE
Casts: NONE SEEN
Crystals: NONE SEEN
Glucose, UA: NEGATIVE mg/dL
HGB URINE DIPSTICK: NEGATIVE
Ketones, ur: NEGATIVE mg/dL
NITRITE: POSITIVE — AB
PROTEIN: NEGATIVE mg/dL
Specific Gravity, Urine: <1.005 — ABNORMAL LOW (ref 1.005–1.030)
Urobilinogen, UA: 0.2 mg/dL (ref 0.0–1.0)
pH: 7 (ref 5.0–8.0)

## 2014-03-07 LAB — LIPASE: LIPASE: 69 U/L (ref 0–75)

## 2014-03-07 NOTE — Progress Notes (Signed)
cc'd to pcp 

## 2014-03-08 ENCOUNTER — Other Ambulatory Visit: Payer: Self-pay | Admitting: Gastroenterology

## 2014-03-08 MED ORDER — CIPROFLOXACIN HCL 250 MG PO TABS: 250.0000 mg | ORAL_TABLET | Freq: Two times a day (BID) | ORAL | Status: DC

## 2014-03-09 LAB — URINE CULTURE

## 2014-03-11 ENCOUNTER — Ambulatory Visit (HOSPITAL_COMMUNITY)
Admission: RE | Admit: 2014-03-11 | Discharge: 2014-03-11 | Disposition: A | Discharge status: Home / Self Care | Payer: Medicare Other | Source: Ambulatory Visit | Attending: Gastroenterology | Admitting: Gastroenterology

## 2014-03-11 DIAGNOSIS — R634 Abnormal weight loss: Secondary | ICD-10-CM

## 2014-03-11 DIAGNOSIS — K429 Umbilical hernia without obstruction or gangrene: Secondary | ICD-10-CM

## 2014-03-11 DIAGNOSIS — R935 Abnormal findings on diagnostic imaging of other abdominal regions, including retroperitoneum: Secondary | ICD-10-CM | POA: Insufficient documentation

## 2014-03-11 DIAGNOSIS — R112 Nausea with vomiting, unspecified: Secondary | ICD-10-CM

## 2014-03-11 DIAGNOSIS — R6881 Early satiety: Secondary | ICD-10-CM

## 2014-03-11 DIAGNOSIS — R1031 Right lower quadrant pain: Secondary | ICD-10-CM

## 2014-03-11 DIAGNOSIS — K219 Gastro-esophageal reflux disease without esophagitis: Secondary | ICD-10-CM

## 2014-03-11 DIAGNOSIS — I7090 Unspecified atherosclerosis: Secondary | ICD-10-CM | POA: Insufficient documentation

## 2014-03-11 DIAGNOSIS — F5000 Anorexia nervosa, unspecified: Secondary | ICD-10-CM

## 2014-03-11 LAB — POCT I-STAT CREATININE: Creatinine, Ser: 0.9 mg/dL (ref 0.50–1.10)

## 2014-03-11 MED ORDER — IOHEXOL 300 MG/ML  SOLN
100.0000 mL | Freq: Once | INTRAMUSCULAR | Status: AC | PRN
  Administered 2014-03-11: 100 mL via INTRAVENOUS

## 2014-03-20 ENCOUNTER — Telehealth: Payer: Self-pay | Admitting: *Deleted

## 2014-03-20 NOTE — Telephone Encounter (Signed)
See result note.  

## 2014-03-20 NOTE — Telephone Encounter (Signed)
Pt called stating she had a test the 13th pt said it was a CT scan and pt would like to her results. Please advise [phone]. Pt wants to speak with the nurse she has questions to ask.

## 2014-03-20 NOTE — Telephone Encounter (Signed)
Routing to LSL 

## 2014-03-21 NOTE — Telephone Encounter (Signed)
Tried to call pt- LMOM 

## 2014-03-22 NOTE — Telephone Encounter (Signed)
See result note.  

## 2014-03-26 ENCOUNTER — Telehealth: Payer: Self-pay

## 2014-03-26 NOTE — Telephone Encounter (Signed)
Pt had already been given her CT results by [facility]. Pt called back today with more questions and problems. She said she has [facility] and he told her that part of her stomach problems was coming from the "blocked arteries and veins" that go to there stomach and intestines. Pt then told me that her problems started in 1967 when her son was born, she said she had him and a 5 pound fibroid tumor, she said this has left an umbilical hernia. She then told me that her body wasn't digesting her food correctly, she said she is loosing weight, she used to be 170# but now she is 93# and at first told me that it came from the nexium she took several years ago but then told me that she doesn't eat and the only thing she eats is graham crackers and coffee. She has a lot of gas. She only has a bm once a week and that is only after taking 8-12 dulcolax. Pt was crying on the phone, telling me all about her problems with her heart and the problems with her legs. She also started crying about the problems she has with her son and the fact that he wont help her do anything. She started crying about the son that is deceased. She said Dr. Juanetta GoslingHawkins wants her to go to an assisted living facility but she said she doesn't feel good and doesn't have anyone to help her pack her things to go. She said she cant go anywhere until her bowels are straightened out. I have offered the patient samples of linzess d/t her copay is $100.00 a month. Pt said she wasn't paying that much for a fancy laxative when she could get dulcolax cheaper. Advised pt that we needed to get her constipation under control and asked her to come by and pick up samples and at least try them and let me know if they worked for her. Asked her to try them for at least a week or so and then let me know. Also encouraged her to call her pcp again about the assisted living arrangements and maybe they could get someone to help her. Pt is reluctant to  call pcp at this time but stated she has a follow up appointment with them soon. Pt will try to come by today or tomorrow and pick up samples that are at the front desk. I was on the phone for 27 minutes with her.

## 2014-03-28 NOTE — Telephone Encounter (Signed)
Discussed with Dr. Jena Gaussourk.  For pp abd pain, early satiety, nausea, dysphagia, weight loss --> recommends EGD+/-ED. Needs to be done in OR secondary to difficult sedation/polypharmacy.  She will likely need further imaging of her pancreas due to chronic duct dilation/cyst although this has been stable. Dr. Jena Gaussourk will decide after EGD, the appropriate next step.

## 2014-04-01 NOTE — Telephone Encounter (Signed)
Tried to call pt- NA 

## 2014-04-02 ENCOUNTER — Ambulatory Visit (INDEPENDENT_AMBULATORY_CARE_PROVIDER_SITE_OTHER): Payer: Medicare Other | Admitting: Cardiology

## 2014-04-02 ENCOUNTER — Encounter: Payer: Self-pay | Admitting: Cardiology

## 2014-04-02 VITALS — BP 125/67 | HR 56 | Ht 63.0 in | Wt 109.0 lb

## 2014-04-02 DIAGNOSIS — I2510 Atherosclerotic heart disease of native coronary artery without angina pectoris: Secondary | ICD-10-CM

## 2014-04-02 NOTE — Progress Notes (Addendum)
Clinical Summary Melissa Holland is a 67 y.o.female seen today for follow up of the following medical problems.   1. CAD  - cath 2008 with occluded RCA with good left to right collaterals, diffuse non-obstructive disease otherwise  - LVEF 55-60% by echo in 2012  - referred for cath 10/2013 in setting of chest pain not improved with intensified antianginal therapy and abnormal stress test, showed occlusion of OM at ostium and also occluded RCA (chronic RCA occlusion from prior films). Not amenable to PCI per discussions with cath physician, would need to be considered for CABG if clinically her pain could not be controlled.   - since last visit denies any chest pain. No significant SOB or DOE.    2. HL  - compliant with statin  - no recent panel in our system, patient reports is followed by Dr Melissa Holland    3. PAD/Carotid disease  - followed by vascular   Past Medical History  Diagnosis Date  . Diabetes mellitus   . Coronary artery disease     Severe single-vessel/100% occluded right with distal collateralization.Medical therapy/Normal LV fx  . Carotid bruit     Bilateral  . Sinus bradycardia   . Dyslipidemia   . DVT (deep venous thrombosis)     Remote portable as him   . COPD (chronic obstructive pulmonary disease)   . Arthritis   . Depression with anxiety   . Leg pain   . Productive cough   . Peripheral arterial disease   . Carotid artery occlusion      Allergies  Allergen Reactions  . Penicillins Anaphylaxis  . Sulfonamide Derivatives Anaphylaxis  . Alupent [Metaproterenol Sulfate] Other (See Comments)    Makes pt "nervous"  . Azithromycin Other (See Comments)    unknown  . Omnipaque [Iohexol] Other (See Comments)    Unknown, patient denies allergy to contrast 03/06/14.  . Paroxetine Other (See Comments)    unknown  . Varenicline Tartrate Other (See Comments)    unknown  . Benadryl [Diphenhydramine Hcl] Other (See Comments)    Can't breath  . Hydrocortisone  Hives  . Prednisone Hives     Current Outpatient Prescriptions  Medication Sig Dispense Refill  . albuterol (2.5 MG/3ML) 0.083% NEBU 3 mL, albuterol (5 MG/ML) 0.5% NEBU 0.5 mL Inhale 2.5 mg into the lungs every 6 (six) hours as needed.       Marland Kitchen albuterol (VENTOLIN HFA) 108 (90 BASE) MCG/ACT inhaler Inhale 2 puffs into the lungs every 6 (six) hours as needed.      . ALPRAZolam (XANAX) 1 MG tablet Take 0.5 mg by mouth 4 (four) times daily as needed. For nerves      . aspirin 81 MG tablet Take 81 mg by mouth daily.        . bisacodyl (DULCOLAX) 5 MG EC tablet Take 20 mg by mouth daily as needed. For bowel movement.      . ciprofloxacin (CIPRO) 250 MG tablet Take 1 tablet (250 mg total) by mouth 2 (two) times daily.  6 tablet  0  . furosemide (LASIX) 20 MG tablet Take 20 mg by mouth as needed.      . metFORMIN (GLUCOPHAGE) 500 MG tablet Take 500 mg by mouth 2 (two) times daily with a meal.       . metoprolol succinate (TOPROL-XL) 25 MG 24 hr tablet Take 12.5 mg by mouth every morning.       . nitroGLYCERIN (NITROSTAT) 0.4 MG SL tablet  Place 1 tablet (0.4 mg total) under the tongue every 5 (five) minutes as needed. For chest pain.  25 tablet  3  . oxyCODONE-acetaminophen (ENDOCET) 10-325 MG per tablet Take 0.5-1 tablets by mouth every 4 (four) hours as needed for pain.       . potassium chloride (KLOR-CON) 10 MEQ CR tablet Take 10 mEq by mouth as needed. When taking Furosemide      . simvastatin (ZOCOR) 40 MG tablet Take 1 tablet (40 mg total) by mouth every evening.  90 tablet  3   No current facility-administered medications for this visit.     Past Surgical History  Procedure Laterality Date  . Parotidectomy      Left  . Shoulder blade      Cysts x 2  . Tonsillectomy and adenoidectomy    . Partial hysterectomy      Subsequent completion later that year,hx of large fibroid tumor  . Appendectomy    . Aortogram      with bilateral lower extremity run-off  . Abdominal hysterectomy    .  Colonoscopy  12/31/2010    RUE:AVWURMR:Long tortuous, but otherwise normal-appearing colon/ Normal rectum     Allergies  Allergen Reactions  . Penicillins Anaphylaxis  . Sulfonamide Derivatives Anaphylaxis  . Alupent [Metaproterenol Sulfate] Other (See Comments)    Makes pt "nervous"  . Azithromycin Other (See Comments)    unknown  . Omnipaque [Iohexol] Other (See Comments)    Unknown, patient denies allergy to contrast 03/06/14.  . Paroxetine Other (See Comments)    unknown  . Varenicline Tartrate Other (See Comments)    unknown  . Benadryl [Diphenhydramine Hcl] Other (See Comments)    Can't breath  . Hydrocortisone Hives  . Prednisone Hives      Family History  Problem Relation Age of Onset  . Cancer Maternal Grandmother     liver  . Cancer Maternal Grandfather     lung  . Diabetes Mother   . Diabetes Son      Social History Melissa Holland reports that she has been smoking Cigarettes.  She has a 52 pack-year smoking history. She has never used smokeless tobacco. Melissa Holland reports that she does not drink alcohol.   Review of Systems CONSTITUTIONAL: No weight loss, fever, chills, weakness or fatigue.  HEENT: Eyes: No visual loss, blurred vision, double vision or yellow sclerae.No hearing loss, sneezing, congestion, runny nose or sore throat.  SKIN: No rash or itching.  CARDIOVASCULAR: per HPI RESPIRATORY: No shortness of breath, cough or sputum.  GASTROINTESTINAL: No anorexia, nausea, vomiting or diarrhea. No abdominal pain or blood.  GENITOURINARY: No burning on urination, no polyuria NEUROLOGICAL: No headache, dizziness, syncope, paralysis, ataxia, numbness or tingling in the extremities. No change in bowel or bladder control.  MUSCULOSKELETAL: No muscle, back pain, joint pain or stiffness.  LYMPHATICS: No enlarged nodes. No history of splenectomy.  PSYCHIATRIC: No history of depression or anxiety.  ENDOCRINOLOGIC: No reports of sweating, cold or heat intolerance. No  polyuria or polydipsia.  Marland Kitchen.   Physical Examination p 56 bp 125/67 Wt 109 lbs BMI 19 Gen: resting comfortably, no acute distress HEENT: no scleral icterus, pupils equal round and reactive, no palptable cervical adenopathy,  CV: RRR, no m/r/g, no JVD, no carotid bruits Resp: Clear to auscultation bilaterally GI: abdomen is soft, non-tender, non-distended, normal bowel sounds, no hepatosplenomegaly MSK: extremities are warm, no edema.  Skin: warm, no rash Neuro:  no focal deficits Psych: appropriate affect  Diagnostic Studies 10/2011 Echo  LVEF 55-60%, no WMAs  10/2013 Echo  LVEF 50-55%, hypokinesis of the distalanteroseptal wall, akinesis basal mid-inferolateral walls. Grade I diastolic dysfunction.  09/2012 Carotid US:  IMPRESSION:  1. Heterogeneous calcified and noncalcified atherosclerotic plaque in the right carotid bulb and proximal right internal carotid artery results in a 50 - 69% diameter stenosis. No significant interval change in the degree of stenosis compared to 10/07/2010 although subjectively there is increased calcification of the plaque.  2. Interval progression of heterogeneous predominately calcified atherosclerotic plaque in the left carotid bulb and proximal internal carotid artery resulting in a greater than 70% diameter stenosis.  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 Abnormal lexiscan MPI. There is a fixed apical defect consistent with prior infarction. There is an inferolateral wall defect with mild peri-infarct ischemia, the interpretation of this defect is limited by significant subdiaphragmatic attenuation adjacent to inferolateral wall. There is hypokinesis of this area, so likely there is a true prior infarct. Interpretation of peri-infarct ischemia of this territory may be limited by subdiaphragmatic attenuation. Correlate findings clinically.  2. Decrased left  ventricular systolic function  11/08/13 Clinic EKG  - initial ekg wandering baseline, repeated. Repeat EKG shows sinus rhythm, RBBB, old TWI inferior leads.  10/2013 Cath  Left mainstem: Heavy calcification. No high grade disease  Left anterior descending (LAD): Long proximal 50%. I have compared this lesion to the 2008 films and it appears to be unchanged. The LAD is a very large vessel wrapping the apex. The mid LAD has 30% stenosis. There are diffuse luminal irregularities. This is a mid to distal moderate to large diagonal vessel that has mild luminal irregularities.  Left circumflex (LCx): The circumflex has had worsening stenosis with now occlusion at the ostium of a large branching OM. This OM has severe diffuse disease proximal to the bifurcation. The vessel after this is free of high grade disease.  Right coronary artery (RCA): Occluded in the mid segment.  Left ventriculography: Left ventricular systolic function is mildly reduced with , LVEF is estimated at 50%, there is no significant mitral regurgitation  Final Conclusions: Two vessel occlusion with moderate disease in the proximal LAD.  Recommendations: The patient would need to be considered for CABG if she has continued chest pain. Discussed with Dr. Wyline MoodBranch.  Pertinent Labs  05/2013: TC 163 TG 50 HDL 92 LDL 61        Assessment and Plan  1. CAD  - prior MPI with inferolateral defect with possible peri-infarct ischemia though technically difficult to evalute, cath shows chronically occluded RCA along with worsening disease of large OM now with occlusion. From discussion with cath cardiologist if peristent cardiac chest pain not controlled with medical therapy would consider evaluating her for CABG though she would be high risk due to multiple comorbidities. - since that cath has not had any recurrent chest pain - continue secondary prevention and risk factor modfication.    2. Hyperlipidemia - continue current statin  3.  PAD - continue regular f/u with vascular       Antoine PocheJonathan F. Williams Dietrick, M.D., F.A.C.C.

## 2014-04-02 NOTE — Patient Instructions (Signed)
Continue all current medications. Your physician wants you to follow up in: 6 months.  You will receive a reminder letter in the mail one-two months in advance.  If you don't receive a letter, please call our office to schedule the follow up appointment   

## 2014-04-04 NOTE — Telephone Encounter (Signed)
Tried to call pt- NA 

## 2014-04-05 ENCOUNTER — Other Ambulatory Visit: Payer: Self-pay | Admitting: *Deleted

## 2014-04-09 ENCOUNTER — Telehealth: Payer: Self-pay | Admitting: Vascular Surgery

## 2014-04-09 ENCOUNTER — Telehealth: Payer: Self-pay

## 2014-04-09 NOTE — Telephone Encounter (Signed)
notified patient of appointment with dr. Darrick Pennafields on 05-23-14 at [facility]2PM

## 2014-04-09 NOTE — Telephone Encounter (Signed)
Tried to call pt- NA 

## 2014-04-09 NOTE — Telephone Encounter (Signed)
Phone call from pt.  Stated she wanted to reschedule a follow-up appt.  [place].  C/o having "warning signs" and wants to see Dr. Darrick PennaFields.  Questioned pt. re: "warning signs".  Stated she has had numbness in her right thumb that moved into the entire right hand, and lasted about 2 hrs.  Stated the numbness moves around from her hand to her right leg.  Reported falling last night; stated her leg just gave-out. Stated "I've been falling a lot."  Reported she has a bump on her right arm from the fall last night.  Denies any difficulty with speech.  Reported she has had times when her memory isn't good.  Stated she has had blurry vision, but denies vision loss in either eye.  Also, c/o left jaw pain and headaches recently.  Advised pt. With the various symptoms reported, she needs to notify her PCP today and get appt. For further evaluation.  Advised will have a scheduler contact her to set up appt. For Carotid and PVD evaluation. (last office visit was 03/2012)  Emphasized importance of notifying her PCP of the recent symptoms she has had.  Verb. Understanding.  Agrees w/ plan.

## 2014-04-12 NOTE — Telephone Encounter (Signed)
Finally able to reach pt. CM has made her an appointment to update her H&P for her procedure.

## 2014-04-26 NOTE — Progress Notes (Signed)
Quick Note:  See telephone note 03/26/14 for plan regarding dilated pancreatic duct/cyst. ______ 

## 2014-05-02 ENCOUNTER — Other Ambulatory Visit: Payer: Self-pay | Admitting: Internal Medicine

## 2014-05-02 ENCOUNTER — Telehealth: Payer: Self-pay

## 2014-05-02 NOTE — Telephone Encounter (Signed)
Pt came to front window today asking to speak with nurse. She has a lot of multiple concerns about getting procedure scheduled with RMR and multiple complaints about her health. I explained to her several times that she had an OV to come back to see [place] so we can get an updated H&P to set up her EGD and after she has her EGD then RMR will make his recommendations to what she needs to do next. Patient doesn't seem to understand what I was trying to tell because she would keep telling me all her problems over and over. I offered her a sooner OV for 6/8 at 11, but she refused because it was too early. I told her that I was trying to work with her, but she needed to work with me if she wanted to be seen sooner than original OV. She said she couldn't do Monday it was too early. I explained that anything else would be in July that we were booked out a month already. She said she would come on 6/17. About 15 minutes later she calls the office and spoke with CS and said that she changed her mind that she would take the 6/8 appointment and is aware that we cancelled the original OV. [phone]

## 2014-05-03 NOTE — Telephone Encounter (Signed)
Noted. Pt has ov on Monday.

## 2014-05-06 ENCOUNTER — Encounter (HOSPITAL_COMMUNITY): Payer: Self-pay | Admitting: Pharmacy Technician

## 2014-05-06 ENCOUNTER — Encounter: Payer: Self-pay | Admitting: Gastroenterology

## 2014-05-06 ENCOUNTER — Ambulatory Visit (INDEPENDENT_AMBULATORY_CARE_PROVIDER_SITE_OTHER): Payer: Medicare Other | Admitting: Gastroenterology

## 2014-05-06 ENCOUNTER — Encounter (INDEPENDENT_AMBULATORY_CARE_PROVIDER_SITE_OTHER): Payer: Self-pay

## 2014-05-06 ENCOUNTER — Other Ambulatory Visit: Payer: Self-pay | Admitting: Internal Medicine

## 2014-05-06 VITALS — BP 131/64 | HR 57 | Temp 96.8°F | Resp 16 | Ht 65.0 in | Wt 108.0 lb

## 2014-05-06 DIAGNOSIS — K5900 Constipation, unspecified: Secondary | ICD-10-CM

## 2014-05-06 DIAGNOSIS — R1319 Other dysphagia: Secondary | ICD-10-CM | POA: Insufficient documentation

## 2014-05-06 DIAGNOSIS — K8689 Other specified diseases of pancreas: Secondary | ICD-10-CM

## 2014-05-06 DIAGNOSIS — F5000 Anorexia nervosa, unspecified: Secondary | ICD-10-CM

## 2014-05-06 DIAGNOSIS — R634 Abnormal weight loss: Secondary | ICD-10-CM

## 2014-05-06 DIAGNOSIS — I2510 Atherosclerotic heart disease of native coronary artery without angina pectoris: Secondary | ICD-10-CM

## 2014-05-06 DIAGNOSIS — R6881 Early satiety: Secondary | ICD-10-CM

## 2014-05-06 DIAGNOSIS — R112 Nausea with vomiting, unspecified: Secondary | ICD-10-CM

## 2014-05-06 DIAGNOSIS — R1314 Dysphagia, pharyngoesophageal phase: Secondary | ICD-10-CM

## 2014-05-06 DIAGNOSIS — K219 Gastro-esophageal reflux disease without esophagitis: Secondary | ICD-10-CM

## 2014-05-06 DIAGNOSIS — Q453 Other congenital malformations of pancreas and pancreatic duct: Secondary | ICD-10-CM | POA: Insufficient documentation

## 2014-05-06 DIAGNOSIS — R1310 Dysphagia, unspecified: Secondary | ICD-10-CM | POA: Insufficient documentation

## 2014-05-06 MED ORDER — PEG 3350-KCL-NA BICARB-NACL 420 G PO SOLR: 4000.0000 mL | Freq: Once | ORAL | Status: DC

## 2014-05-06 NOTE — Patient Instructions (Signed)
1. Upper endoscopy with Dr. [name] as scheduled. 2. For your bowel regimen: Please go home and take one bottle of magnesium citrate. You may buy this over-the-counter and it is very cheap. You may continue Dulcolax as you are.  3. Our choices for constipation are very limited due to you not tolerating Miralax, Amitiza, Linzess.   Constipation, Adult Constipation is when a person has fewer than 3 bowel movements a week; has difficulty having a bowel movement; or has stools that are dry, hard, or larger than normal. As people grow older, constipation is more common. If you try to fix constipation with medicines that make you have a bowel movement (laxatives), the problem may get worse. Long-term laxative use may cause the muscles of the colon to become weak. A low-fiber diet, not taking in enough fluids, and taking certain medicines may make constipation worse. CAUSES   Certain medicines, such as antidepressants, pain medicine, iron supplements, antacids, and water pills.   Certain diseases, such as diabetes, irritable bowel syndrome (IBS), thyroid disease, or depression.   Not drinking enough water.   Not eating enough fiber-rich foods.   Stress or travel.  Lack of physical activity or exercise.  Not going to the restroom when there is the urge to have a bowel movement.  Ignoring the urge to have a bowel movement.  Using laxatives too much. SYMPTOMS   Having fewer than 3 bowel movements a week.   Straining to have a bowel movement.   Having hard, dry, or larger than normal stools.   Feeling full or bloated.   Pain in the lower abdomen.  Not feeling relief after having a bowel movement. DIAGNOSIS  Your caregiver will take a medical history and perform a physical exam. Further testing may be done for severe constipation. Some tests may include:   A barium enema X-ray to examine your rectum, colon, and sometimes, your small intestine.  A sigmoidoscopy to examine your  lower colon.  A colonoscopy to examine your entire colon. TREATMENT  Treatment will depend on the severity of your constipation and what is causing it. Some dietary treatments include drinking more fluids and eating more fiber-rich foods. Lifestyle treatments may include regular exercise. If these diet and lifestyle recommendations do not help, your caregiver may recommend taking over-the-counter laxative medicines to help you have bowel movements. Prescription medicines may be prescribed if over-the-counter medicines do not work.  HOME CARE INSTRUCTIONS   Increase dietary fiber in your diet, such as fruits, vegetables, whole grains, and beans. Limit high-fat and processed sugars in your diet, such as [place] fries, hamburgers, cookies, candies, and soda.   A fiber supplement may be added to your diet if you cannot get enough fiber from foods.   Drink enough fluids to keep your urine clear or pale yellow.   Exercise regularly or as directed by your caregiver.   Go to the restroom when you have the urge to go. Do not hold it.  Only take medicines as directed by your caregiver. Do not take other medicines for constipation without talking to your caregiver first. SEEK IMMEDIATE MEDICAL CARE IF:   You have bright red blood in your stool.   Your constipation lasts for more than 4 days or gets worse.   You have abdominal or rectal pain.   You have thin, pencil-like stools.  You have unexplained weight loss. MAKE SURE YOU:   Understand these instructions.  Will watch your condition.  Will get help right away if  you are not doing well or get worse. Document Released: 08/13/2004 Document Revised: 02/07/2012 Document Reviewed: 08/27/2013 [facility] Patient Information 2014 [employer] [place].

## 2014-05-06 NOTE — Progress Notes (Signed)
Please let patient know, since she says she is not supposed to take the magcitrate, we have called in golytely to [facility]. Will be used for colon purge. She can take 8 ounces every 15 minutes until she feels like she has had adequate bowel movements.   From that point, she can continue her current regimen with dulcolax OR we can send in lactulose RX for daily use/management of constipation. Just let me know what she wants to do so I can send in RX.

## 2014-05-06 NOTE — Progress Notes (Signed)
OK. EGD as scheduled.

## 2014-05-06 NOTE — Assessment & Plan Note (Signed)
68 year old lady presents back to schedule upper endoscopy with possible dilation with deep sedation due to difficult conscious sedation the past, polypharmacy. EGD planned for postprandial abdominal pain, early satiety, nausea, weight loss, dysphagia.  I have discussed the risks, alternatives, benefits with regards to but not limited to the risk of reaction to medication, bleeding, infection, perforation and the patient is agreeable to proceed. Written consent to be obtained.No prior endoscopy performed. Patient has extremely high anxiety level which makes it difficult to obtain history at times. He to rule out peptic ulcer disease, complicated GERD. 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

## 2014-05-06 NOTE — Assessment & Plan Note (Signed)
Patient is very difficult to manage. Constipation likely worsened buy her medications, oxycodone. She has either had a side effect of nausea and vomiting or refuses to take MiraLax, Amitiza, Linzess. After discharging patient today, she stopped my staff and said she was told never to take MagCitrate again (which was planned for colonic purge - patient wanted something cheap and effective). I advised her not to take magnesium citrate and that we will come up with another plan. I'll have my office staff contact her and see if she would be willing to try a bowel regimen for colon prep, which ever is covered by her insurance. Unfortunately the only other medication that she has not tried, failed, refuses to take for constipation would be lactulose on a daily basis.

## 2014-05-06 NOTE — Progress Notes (Signed)
Primary Care Physician:  Fredirick MaudlinHAWKINS,EDWARD L, MD  Primary Gastroenterologist:  Roetta SessionsMichael Rourk, MD   Chief Complaint  Patient presents with  . Office Visit    HPI:  Melissa StarcherJacqueline P Skorupski is a 68 y.o. female here to schedule EGD +/-ED in OR with Dr. Jena Gaussourk. She was last seen in April 2015 with complaints of constipation and abdominal pain. She had been referred to us by Dr. Juanetta GoslingHawkins at that time. Prior to that we saw her 2012 at time of colonoscopy. When I last saw her she complained of a 15 pound weight loss over 6 months. Her weight is down 2 more pounds since we last saw her. She reports 10 years ago she weighed 170 pounds his saw nutritionist for "unexplained weight loss and quite at that time. She's very fixated on having no body fat or muscle tone.  She complains of early satiety, postprandial abdominal pain. Frequent nausea. Complains of dysphagia to solid foods. She takes TUMS for heartburn. Complains of poor appetite. Has difficulty having a bowel movement. Takes Dulcolax 4 tablets about every other day. Recently had to try senna and 2-3 fleets enemas but is not having a bowel movement. States she has not had but one stool in 2 weeks. Complains of mid abdominal pain. Positive flatus. Previously he had nausea and vomiting related to the Amitiza, Linzess. Can't "swallow" Miralax.   Since her last office visit she had a CT of the abdomen and pelvis that showed a significant stool burden but no obstruction. Mild chronic intra-and extrahepatic biliary dilation and dilation of the pancreatic duct. Stable small pancreatic cystic lesion that connect with the pancreatic duct in the pancreatic body consistent with a dilated side duct. Significant atherosclerotic change of the abdominal aorta, iliac arteries and branch vessels.     Followed by Dr. Fabienne Brunsharles Fields. 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 Patent superior mesenteric artery. Subsequently underwent  mesenteric angiogram that showed 40% celiac stenosis, 0% SMA stenosis, 40% IMA stenosis. She had 90% stenosis of the left common femoral artery and a left tibial peroneal, and anterior tibial artery. Also had a right lower showed a superficial femoral artery occlusion and occlusion of the right tibial peroneal trunk. This was done in December 2012. No stenting at that time  Current Outpatient Prescriptions  Medication Sig Dispense Refill  . albuterol (2.5 MG/3ML) 0.083% NEBU 3 mL, albuterol (5 MG/ML) 0.5% NEBU 0.5 mL Inhale 2.5 mg into the lungs every 6 (six) hours as needed.       Marland Kitchen. albuterol (VENTOLIN HFA) 108 (90 BASE) MCG/ACT inhaler Inhale 2 puffs into the lungs every 6 (six) hours as needed.      . ALPRAZolam (XANAX) 1 MG tablet Take 1 mg by mouth 4 (four) times daily as needed. For nerves      . aspirin 81 MG tablet Take 81 mg by mouth daily.        . bisacodyl (DULCOLAX) 5 MG EC tablet Take 20 mg by mouth daily as needed. For bowel movement.      . furosemide (LASIX) 20 MG tablet Take 20 mg by mouth as needed.      . metFORMIN (GLUCOPHAGE) 500 MG tablet Take 500 mg by mouth 2 (two) times daily with a meal.       . metoprolol succinate (TOPROL-XL) 25 MG 24 hr tablet Take 12.5 mg by mouth every morning.       . nitroGLYCERIN (NITROSTAT) 0.4 MG SL tablet  Place 1 tablet (0.4 mg total) under the tongue every 5 (five) minutes as needed. For chest pain.  25 tablet  3  . oxyCODONE-acetaminophen (ENDOCET) 10-325 MG per tablet Take 0.5-1 tablets by mouth every 4 (four) hours as needed for pain.       . potassium chloride (KLOR-CON) 10 MEQ CR tablet Take 10 mEq by mouth as needed. When taking Furosemide      . simvastatin (ZOCOR) 40 MG tablet Take 1 tablet (40 mg total) by mouth every evening.  90 tablet  3   No current facility-administered medications for this visit.    Allergies as of 05/06/2014 - Review Complete 05/06/2014  Allergen Reaction Noted  . Penicillins Anaphylaxis   . Sulfonamide  derivatives Anaphylaxis   . Alupent [metaproterenol sulfate] Other (See Comments) 09/10/2011  . Azithromycin Other (See Comments)   . Linzess [linaclotide] Nausea And Vomiting 05/06/2014  . Omnipaque [iohexol] Other (See Comments) 09/10/2011  . Paroxetine Other (See Comments) 04/09/2010  . Varenicline tartrate Other (See Comments) 04/09/2010  . Benadryl [diphenhydramine hcl] Other (See Comments) 03/20/2012  . Hydrocortisone Hives 03/20/2012  . Prednisone Hives 03/20/2012    Past Medical History  Diagnosis Date  . Diabetes mellitus   . Coronary artery disease     Severe single-vessel/100% occluded right with distal collateralization.Medical therapy/Normal LV fx  . Carotid bruit     Bilateral  . Sinus bradycardia   . Dyslipidemia   . DVT (deep venous thrombosis)     Remote portable as him   . COPD (chronic obstructive pulmonary disease)   . Arthritis   . Depression with anxiety   . Leg pain   . Productive cough   . Peripheral arterial disease   . Carotid artery occlusion     Past Surgical History  Procedure Laterality Date  . Parotidectomy      Left  . Shoulder blade      Cysts x 2  . Tonsillectomy and adenoidectomy    . Partial hysterectomy      Subsequent completion later that year,hx of large fibroid tumor  . Appendectomy    . Aortogram      with bilateral lower extremity run-off  . Abdominal hysterectomy    . Colonoscopy  12/31/2010    ION:GEXBRMR:Long tortuous, but otherwise normal-appearing colon/ Normal rectum    Family History  Problem Relation Age of Onset  . Cancer Maternal Grandmother     liver  . Cancer Maternal Grandfather     lung  . Diabetes Mother   . Diabetes Son     History   Social History  . Marital Status: Widowed    Spouse Name: N/A    Number of Children: N/A  . Years of Education: N/A   Occupational History  . Not on file.   Social History Main Topics  . Smoking status: Current Every Day Smoker -- 1.00 packs/day for 52 years     Types: Cigarettes  . Smokeless tobacco: Never Used     Comment: states not smoking as much  . Alcohol Use: No  . Drug Use: No  . Sexual Activity: Not on file   Other Topics Concern  . Not on file   Social History Narrative  . No narrative on file      ROS:  General: see hpi. Eyes: Negative for vision changes.  ENT: Negative for hoarseness, difficulty swallowing , nasal congestion. CV: Negative for chest pain, angina, palpitations, dyspnea on exertion, peripheral edema.  Respiratory: Negative  for dyspnea at rest, dyspnea on exertion, cough, sputum, wheezing.  GI: See history of present illness. GU:  Negative for dysuria, hematuria, urinary incontinence, urinary frequency, nocturnal urination.  MS: Negative for joint pain, low back pain.  Derm: Negative for rash or itching.  Neuro: Negative for weakness, abnormal sensation, seizure, frequent headaches, memory loss, confusion.  Psych: +for anxiety, depression. No suicidal ideation, hallucinations.  Endo:see hpi Heme: Negative for bruising or bleeding. Allergy: Negative for rash or hives.    Physical Examination:  BP 131/64  Pulse 57  Temp(Src) 96.8 F (36 C) (Oral)  Resp 16  Ht 5\' 5"  (1.651 m)  Wt 108 lb (48.988 kg)  BMI 17.97 kg/m2   General: Well-nourished, well-developed in no acute distress.  Head: Normocephalic, atraumatic.   Eyes: Conjunctiva pink, no icterus. Mouth: Oropharyngeal mucosa moist and pink , no lesions erythema or exudate. Neck: Supple without thyromegaly, masses, or lymphadenopathy.  Lungs: Clear to auscultation bilaterally.  Heart: Regular rate and rhythm, no murmurs rubs or gallops.  Abdomen: Bowel sounds are normal, mild lower abdominal tenderness to deep palpation, nondistended, no hepatosplenomegaly or masses, no abdominal bruits or    hernia , no rebound or guarding.  With standing, patient's abd more protruberant, likely related to week abdominal wall muscles.  Rectal: no external findings.  Small amount of soft brown, heme negative stool on exam. NT. No impaction. Extremities: No lower extremity edema. No clubbing or deformities.  Neuro: Alert and oriented x 4 , grossly normal neurologically.  Skin: Warm and dry, no rash or jaundice.   Psych: Alert and cooperative, normal mood and affect.

## 2014-05-06 NOTE — Progress Notes (Signed)
Pt is aware. She said she tried the cherry magcitrate and was able to keep it down after she drank a whole bottle of water behind it. She also wants to stay with dulcolax right now.

## 2014-05-06 NOTE — Assessment & Plan Note (Signed)
Chronically abnormal biliary and pancreatic duct system. Please see CT scan report. Discussed with Dr. [name] previously. Plan for EGD.  She will likely need further imaging of her pancreas due to chronic duct dilation/cyst although this has been stable. Dr. [name] will decide after EGD, the appropriate next step.

## 2014-05-07 NOTE — Progress Notes (Signed)
cc'd to pcp 

## 2014-05-08 NOTE — Patient Instructions (Signed)
Melissa Holland  05/08/2014   Your procedure is scheduled on:  05/16/2014  [facility] to [name] at  [place]  AM.  Call this number if you have problems the morning of surgery: [phone]   Remember:   Do not eat food or drink liquids after midnight.   Take these medicines the morning of surgery with A SIP OF WATER:  Xanax, metoprolol, oxycodone   Do not wear jewelry, make-up or nail polish.  Do not wear lotions, powders, or perfumes.   Do not shave 48 hours prior to surgery. Men may shave face and neck.  Do not bring valuables to the hospital.  [facility] is not responsible for any belongings or valuables.               Contacts, dentures or bridgework may not be worn into surgery.  Leave suitcase in the car. After surgery it may be brought to your room.  For patients admitted to the hospital, discharge time is determined by your treatment team.               Patients discharged the day of surgery will not be allowed to drive home.  Name and phone number of your driver: family  Special Instructions: N/A   Please read over the following fact sheets that you were given: Pain Booklet, Coughing and Deep Breathing, Surgical Site Infection Prevention, Anesthesia Post-op Instructions and Care and Recovery After Surgery Esophageal Dilatation The esophagus is the long, narrow tube which carries food and liquid from the mouth to the stomach. Esophageal dilatation is the technique used to stretch a blocked or narrowed portion of the esophagus. This procedure is used when a part of the esophagus has become so narrow that it becomes difficult, painful or even impossible to swallow. This is generally an uncomplicated form of treatment. When this is not successful, chest surgery may be required. This is a much more extensive form of treatment with a longer recovery time. CAUSES  Some of the more common causes of blockage or strictures of the esophagus are:  Narrowing from longstanding  inflammation (soreness and redness) of the lower esophagus. This comes from the constant exposure of the lower esophagus to the acid which bubbles up from the stomach. Over time this causes scarring and narrowing of the lower esophagus.  Hiatal hernia in which a small part of the stomach bulges (herniates) up through the diaphragm. This can cause a gradual narrowing of the end of the esophagus.  Schatzki's Ring is a narrow ring of benign (non-cancerous) fibrous tissue which constricts the lower esophagus. The reason for this is not known.  Scleroderma is a connective tissue disorder that affects the esophagus and makes swallowing difficult.  Achalasia is an absence of nerves to the lower esophagus and to the esophageal sphincter. This is the circular muscle between the stomach and esophagus that relaxes to allow food into the stomach. After swallowing, it contracts to keep food in the stomach. This absence of nerves may be congenital (present since birth). This can cause irregular spasms of the lower esophageal muscle. This spasm does not open up to allow food and fluid through. The result is a persistent blockage with subsequent slow trickling of the esophageal contents into the stomach.  Strictures may develop from swallowing materials which damage the esophagus. Some examples are strong acids or alkalis such as lye.  Growths such as benign (non-cancerous) and malignant (cancerous) tumors can block the  esophagus.  Heredity (present since birth) causes. DIAGNOSIS  Your caregiver often suspects this problem by taking a medical history. They will also do a physical exam. They can then prove their suspicions using X-rays and endoscopy. Endoscopy is an exam in which a tube like a small flexible telescope is used to look at your esophagus.  TREATMENT There are different stretching (dilating) techniques which can be used. Simple bougie dilatation may be done in the office. This usually takes only a  couple minutes. A numbing (anesthetic) spray of the throat is used. Endoscopy, when done, is done in an endoscopy suite, under mild sedation. When fluoroscopy is used, the procedure is performed in X-ray. Other techniques require a little longer time. Recovery is usually quick. There is no waiting time to begin eating and drinking to test success of the treatment. Following are some of the methods used. Narrowing of the esophagus is treated by making it bigger. Commonly this is a mechanical problem which can be treated with stretching. This can be done in different ways. Your caregiver will discuss these with you. Some of the means used are:  A series of graduated (increasing thickness) flexible dilators can be used. These are weighted tubes passed through the esophagus into the stomach. The tubes used become progressively larger until the desired stretched size is reached. Graduated dilators are a simple and quick way of opening the esophagus. No visualization is required.  Another method is the use of endoscopy to place a flexible wire across the stricture. The endoscope is removed and the wire left in place. A dilator with a hole through it from end to end is guided down the esophagus and across the stricture. One or more of these dilators are passed over the wire. At the end of the exam, the wire is removed. This type of treatment may be performed in the X-ray department under fluoroscopy. An advantage of this procedure is the examiner is visualizing the end opening in the esophagus.  Stretching of the esophagus may be done using balloons. Deflated balloons are placed through the endoscope and across the stricture. This type of balloon dilatation is often done at the time of endoscopy or fluoroscopy. Flexible endoscopy allows the examiner to directly view the stricture. A balloon is inserted in the deflated form into the area of narrowing. It is then inflated with air to a certain pressure that is  pre-set for a given circumference. When inflated, it becomes sausage shaped, stretched, and makes the stricture larger.  Achalasia requires a longer larger balloon-type dilator. This is frequently done under X-ray control. In this situation, the spastic muscle fibers in the lower esophagus are stretched. All of the above procedures make the passage of food and water into the stomach easier. They also make it easier for stomach contents to reflux back into the esophagus. Special medications may be used following the procedure to help prevent further stricturing. Proton-pump inhibitor medications are good at decreasing the amount of acid in the stomach juice. When stomach juice refluxes into the esophagus, the juice is no longer as acidic and is less likely to burn or scar the esophagus. RISKS AND COMPLICATIONS Esophageal dilatation is usually performed effectively and without problems. Some complications that can occur are:  A small amount of bleeding almost always happens where the stretching takes place. If this is too excessive it may require more aggressive treatment.  An uncommon complication is perforation (making a hole) of the esophagus. The esophagus is thin. It  is easy to make a hole in it. If this happens, an operation may be necessary to repair this.  A small, undetected perforation could lead to an infection in the chest. This can be very serious. HOME CARE INSTRUCTIONS   If you received sedation for your procedure, do not drive, make important decisions, or perform any activities requiring your full coordination. Do not drink alcohol, take sedatives, or use any mind altering chemicals unless instructed by your caregiver.  You may use throat lozenges or warm salt water gargles if you have throat discomfort  You can begin eating and drinking normally on return home unless instructed otherwise. Do not purposely try to force large chunks of food down to test the benefits of your  procedure.  Mild discomfort can be eased with sips of ice water.  Medications for discomfort may or may not be needed. SEEK IMMEDIATE MEDICAL CARE IF:   You begin vomiting up blood.  You develop black tarry stools  You develop chills or an unexplained temperature of over 101 F (38.3 C)  You develop chest or abdominal pain.  You develop shortness of breath or feel lightheaded or faint.  Your swallowing is becoming more painful, difficult, or you are unable to swallow. MAKE SURE YOU:   Understand these instructions.  Will watch your condition.  Will get help right away if you are not doing well or get worse. Document Released: 01/06/2006 Document Revised: 02/07/2012 Document Reviewed: 02/23/2006 [facility] Patient Information 2014 [place]. Esophagogastroduodenoscopy Esophagogastroduodenoscopy (EGD) is a procedure to examine the lining of the esophagus, stomach, and first part of the small intestine (duodenum). A long, flexible, lighted tube with a camera attached (endoscope) is inserted down the throat to view these organs. This procedure is done to detect problems or abnormalities, such as inflammation, bleeding, ulcers, or growths, in order to treat them. The procedure lasts about 5 20 minutes. It is usually an outpatient procedure, but it may need to be performed in emergency cases in the hospital. LET YOUR CAREGIVER KNOW ABOUT:   Allergies to food or medicine.  All medicines you are taking, including vitamins, herbs, eyedrops, and over-the-counter medicines and creams.  Use of steroids (by mouth or creams).  Previous problems you or members of your family have had with the use of anesthetics.  Any blood disorders you have.  Previous surgeries you have had.  Other health problems you have.  Possibility of pregnancy, if this applies. RISKS AND COMPLICATIONS  Generally, EGD is a safe procedure. However, as with any procedure, complications can occur. Possible  complications include:  Infection.  Bleeding.  Tearing (perforation) of the esophagus, stomach, or duodenum.  Difficulty breathing or not being able to breath.  Excessive sweating.  Spasms of the larynx.  Slowed heartbeat.  Low blood pressure. BEFORE THE PROCEDURE  Do not eat or drink anything for 6 8 hours before the procedure or as directed by your caregiver.  Ask your caregiver about changing or stopping your regular medicines.  If you wear dentures, be prepared to remove them before the procedure.  Arrange for someone to drive you home after the procedure. PROCEDURE   A vein will be accessed to give medicines and fluids. A medicine to relax you (sedative) and a pain reliever will be given through that access into the vein.  A numbing medicine (local anesthetic) may be sprayed on your throat for comfort and to stop you from gagging or coughing.  A mouth guard may be placed in  your mouth to protect your teeth and to keep you from biting on the endoscope.  You will be asked to lie on your left side.  The endoscope is inserted down your throat and into the esophagus, stomach, and duodenum.  Air is put through the endoscope to allow your caregiver to view the lining of your esophagus clearly.  The esophagus, stomach, and duodenum is then examined. During the exam, your caregiver may:  Remove tissue to be examined under a microscope (biopsy) for inflammation, infection, or other medical problems.  Remove growths.  Remove objects (foreign bodies) that are stuck.  Treat any bleeding with medicines or other devices that stop tissues from bleeding (hot cauters, clipping devices).  Widen (dilate) or stretch narrowed areas of the esophagus and stomach.  The endoscope will then be withdrawn. AFTER THE PROCEDURE  You will be taken to a recovery area to be monitored. You will be able to go home once you are stable and alert.  Do not eat or drink anything until the local  anesthetic and numbing medicines have worn off. You may choke.  It is normal to feel bloated, have pain with swallowing, or have a sore throat for a short time. This will wear off.  Your caregiver should be able to discuss his or her findings with you. It will take longer to discuss the test results if any biopsies were taken. Document Released: 03/18/2005 Document Revised: 11/01/2012 Document Reviewed: 10/18/2012 [facility] Patient Information 2014 [place], [place]. PATIENT INSTRUCTIONS POST-ANESTHESIA  IMMEDIATELY FOLLOWING SURGERY:  Do not drive or operate machinery for the first twenty four hours after surgery.  Do not make any important decisions for twenty four hours after surgery or while taking narcotic pain medications or sedatives.  If you develop intractable nausea and vomiting or a severe headache please notify your doctor immediately.  FOLLOW-UP:  Please make an appointment with your surgeon as instructed. You do not need to follow up with anesthesia unless specifically instructed to do so.  WOUND CARE INSTRUCTIONS (if applicable):  Keep a dry clean dressing on the anesthesia/puncture wound site if there is drainage.  Once the wound has quit draining you may leave it open to air.  Generally you should leave the bandage intact for twenty four hours unless there is drainage.  If the epidural site drains for more than 36-48 hours please call the anesthesia department.  QUESTIONS?:  Please feel free to call your physician or the hospital operator if you have any questions, and they will be happy to assist you.

## 2014-05-09 ENCOUNTER — Other Ambulatory Visit: Payer: Self-pay

## 2014-05-09 ENCOUNTER — Encounter (HOSPITAL_COMMUNITY): Payer: Self-pay

## 2014-05-09 ENCOUNTER — Encounter (HOSPITAL_COMMUNITY)
Admission: RE | Admit: 2014-05-09 | Discharge: 2014-05-09 | Disposition: A | Discharge status: Home / Self Care | Payer: Medicare Other | Source: Ambulatory Visit | Attending: Internal Medicine | Admitting: Internal Medicine

## 2014-05-09 ENCOUNTER — Telehealth: Payer: Self-pay | Admitting: Internal Medicine

## 2014-05-09 DIAGNOSIS — Z01812 Encounter for preprocedural laboratory examination: Secondary | ICD-10-CM | POA: Insufficient documentation

## 2014-05-09 DIAGNOSIS — Z01810 Encounter for preprocedural cardiovascular examination: Secondary | ICD-10-CM | POA: Insufficient documentation

## 2014-05-09 LAB — BASIC METABOLIC PANEL
BUN: 14 mg/dL (ref 6–23)
CALCIUM: 9.5 mg/dL (ref 8.4–10.5)
CO2: 29 meq/L (ref 19–32)
CREATININE: 0.68 mg/dL (ref 0.50–1.10)
Chloride: 93 mEq/L — ABNORMAL LOW (ref 96–112)
GFR calc non Af Amer: 87 mL/min — ABNORMAL LOW (ref >90)
Glucose, Bld: 156 mg/dL — ABNORMAL HIGH (ref 70–99)
Potassium: 4.8 mEq/L (ref 3.7–5.3)
Sodium: 134 mEq/L — ABNORMAL LOW (ref 137–147)

## 2014-05-09 LAB — HEMOGLOBIN AND HEMATOCRIT, BLOOD
HCT: 37.4 % (ref 36.0–46.0)
HEMOGLOBIN: 12.6 g/dL (ref 12.0–15.0)

## 2014-05-09 NOTE — Telephone Encounter (Signed)
Please call Cliffton Asters, RN in Short Stay about which procedure is scheduled for this patient. She said patient thinks she's having the upper and lower, but Pam only sees EGD w/dilation in the OR. Please confirm with her. I told her that LAL would be back in 1/2 hour. Nurse called back a second time saying that patient has a rx for a prep that says she needs to drink it prior her procedure. Patient was getting frustrated at the hospital and Pam was trying to clear this up with patient while she was there. I transferred call to CM

## 2014-05-09 NOTE — Telephone Encounter (Signed)
Correction: colon purge

## 2014-05-09 NOTE — Pre-Procedure Instructions (Signed)
Pt came back from Dr Rourk's office.  Signed consent for EGD/ED with propofol.  Pt wants to proceed with EGD/ED.

## 2014-05-09 NOTE — Pre-Procedure Instructions (Signed)
Pt states she is having an upper and lower Endoscopy.   She states she has a Rx for Golytely to take before her procedure.  Camil at Dr Luvenia Starch office notified.  Camil confirmed only EGD/ED.  Pt very anxious and agitated.  I told her that her colonoscopy in 2012 was OK.    Ms Drolet talked to Camil on the phone.  She Is going home to get her instructions and Tana Coast will call her to clarify.

## 2014-05-09 NOTE — Telephone Encounter (Signed)
Patient called and was very upset because she thought she was having a colonoscopy and upper endoscopy.  I tried to explain to the patient that she was only having an upper endoscopy and that you sent a prep into her pharmacy to do a colon purse, given her complaint of constupation.  She said she was walking out of the hospital and would like to speak with Verlon Au.  Cliffton Asters stated they were not charging her for a pre-op visit because of the confusion.   Routing to [place].

## 2014-05-13 NOTE — Telephone Encounter (Signed)
I LMOAM today at 1050AM.  Her last TCS was 3 years ago. Really no clear indication for a colonoscopy and not recommended when I discussed with Dr. Jena Gaussourk back in 02/2014.   Await return call from patient.

## 2014-05-14 ENCOUNTER — Other Ambulatory Visit: Payer: Self-pay | Admitting: Internal Medicine

## 2014-05-14 ENCOUNTER — Telehealth: Payer: Self-pay | Admitting: *Deleted

## 2014-05-14 NOTE — Telephone Encounter (Signed)
Please double check with Melissa Holland/pre-op to see that patient is all set for EGD, since she walked out of her pre-op appt last week. i believe she went back and completed.

## 2014-05-14 NOTE — Telephone Encounter (Signed)
Benedetto GoadLeighann, will you call preop and check on this and make sure she completed everything. We she came by the office last week she hadnt signed her consent and was supposed to go back and sign it.

## 2014-05-14 NOTE — Telephone Encounter (Signed)
Patient returned your call, please giver her call at [phone].

## 2014-05-14 NOTE — Telephone Encounter (Signed)
Patient is calling back again asking for LSL to return call

## 2014-05-14 NOTE — Telephone Encounter (Signed)
Routing to LSL- she had left a message for pt yesterday.

## 2014-05-14 NOTE — Telephone Encounter (Signed)
Pt called wanting to speak with Verlon AuLeslie pt said she has been trying to talk to [place] for [place] days now, pt said she took her citrus 05/06/14 and her BM moved. Pt said her BM has not moved since 05/06/14 pt said she needs to make only one trip to the drug store pt wants to take more citrus to have a BM pt said she cannot go out in heat because of her medications. Pt said she is so stopped up that she feels likes she going to throw up she has a hard time keeping food down . Pt has she gets gas and she'll get it 30-45 minutes after she eats. Pt said something has to give. Please Advise [phone]

## 2014-05-14 NOTE — Telephone Encounter (Signed)
Spoke with patient. Answered multiple questions. She is still having issues with constipation.   Magnesium Citrate worked on the 05/06/14. Then no BM until today. Took 8 Dulcolax last night. Generally takes Dulcolax every 3-4 days. Advised to try 2-3 every night. Continue water consumption as before.   [place] because she can't live alone anymore.  Has gotten the bowel prep but has not mixed yet. Told by pharmacist that it was for a colonoscopy so she thought she was having a colonoscopy. Explained to patient that we did not plan for a colonoscopy, the bowel prep was just for colon purge related constipation. She voiced understanding. She will save it for a later date if needed.   She states she completed preop and is set for EGD in OR on 05/16/14.   On the phone for 29 minutes.

## 2014-05-15 ENCOUNTER — Telehealth: Payer: Self-pay | Admitting: Internal Medicine

## 2014-05-15 ENCOUNTER — Ambulatory Visit: Payer: Medicare Other | Admitting: Gastroenterology

## 2014-05-15 NOTE — Telephone Encounter (Signed)
Pt has called again today with multiple questions and needs reassurance that she is doing everything correct to get ready for her procedure (EGD) tomorrow. I told her that she was, but she would rather hear it from LSL. Please call patient back at [phone]

## 2014-05-15 NOTE — Telephone Encounter (Signed)
Discussed with patient yesterday. She felt like she needed something for constipation relief. Ok to take [employer]ot[employer]citrate today.

## 2014-05-15 NOTE — Telephone Encounter (Signed)
Please see my other telephone note, lengthy discussion with patient on 05/14/14.

## 2014-05-15 NOTE — Telephone Encounter (Signed)
Patient called wanting to know what time should she take her magnesium citrate.  I spoke with Verlon AuLeslie and she stated the patient should take it now.  I informed the patient of that.  Routing to [place] for confirmation.

## 2014-05-15 NOTE — Telephone Encounter (Signed)
Noted  

## 2014-05-15 NOTE — Telephone Encounter (Signed)
Discussed with Durward Mallardamille. She will call patient.

## 2014-05-15 NOTE — Telephone Encounter (Signed)
Patient is addiment about speaking to LSL

## 2014-05-15 NOTE — Telephone Encounter (Signed)
Pre Op was completed and she is scheduled for 6/18 w/RMR

## 2014-05-16 ENCOUNTER — Encounter (HOSPITAL_COMMUNITY): Payer: Medicare Other | Admitting: Anesthesiology

## 2014-05-16 ENCOUNTER — Encounter (HOSPITAL_COMMUNITY)
Admission: RE | Disposition: A | Discharge status: Home / Self Care | Payer: Self-pay | Source: Ambulatory Visit | Attending: Internal Medicine

## 2014-05-16 ENCOUNTER — Ambulatory Visit (HOSPITAL_COMMUNITY): Payer: Medicare Other | Admitting: Anesthesiology

## 2014-05-16 ENCOUNTER — Telehealth: Payer: Self-pay

## 2014-05-16 ENCOUNTER — Encounter (HOSPITAL_COMMUNITY): Payer: Self-pay | Admitting: *Deleted

## 2014-05-16 ENCOUNTER — Ambulatory Visit (HOSPITAL_COMMUNITY)
Admission: RE | Admit: 2014-05-16 | Discharge: 2014-05-16 | Disposition: A | Discharge status: Home / Self Care | Payer: Medicare Other | Source: Ambulatory Visit | Attending: Internal Medicine | Admitting: Internal Medicine

## 2014-05-16 DIAGNOSIS — R112 Nausea with vomiting, unspecified: Secondary | ICD-10-CM

## 2014-05-16 DIAGNOSIS — K2960 Other gastritis without bleeding: Secondary | ICD-10-CM | POA: Insufficient documentation

## 2014-05-16 DIAGNOSIS — K219 Gastro-esophageal reflux disease without esophagitis: Secondary | ICD-10-CM

## 2014-05-16 DIAGNOSIS — R1310 Dysphagia, unspecified: Secondary | ICD-10-CM | POA: Insufficient documentation

## 2014-05-16 DIAGNOSIS — R1314 Dysphagia, pharyngoesophageal phase: Secondary | ICD-10-CM

## 2014-05-16 DIAGNOSIS — J4489 Other specified chronic obstructive pulmonary disease: Secondary | ICD-10-CM | POA: Insufficient documentation

## 2014-05-16 DIAGNOSIS — E119 Type 2 diabetes mellitus without complications: Secondary | ICD-10-CM | POA: Insufficient documentation

## 2014-05-16 DIAGNOSIS — K259 Gastric ulcer, unspecified as acute or chronic, without hemorrhage or perforation: Secondary | ICD-10-CM | POA: Insufficient documentation

## 2014-05-16 DIAGNOSIS — R1013 Epigastric pain: Secondary | ICD-10-CM | POA: Insufficient documentation

## 2014-05-16 DIAGNOSIS — F17200 Nicotine dependence, unspecified, uncomplicated: Secondary | ICD-10-CM | POA: Insufficient documentation

## 2014-05-16 DIAGNOSIS — R6881 Early satiety: Secondary | ICD-10-CM

## 2014-05-16 DIAGNOSIS — J449 Chronic obstructive pulmonary disease, unspecified: Secondary | ICD-10-CM | POA: Insufficient documentation

## 2014-05-16 DIAGNOSIS — Z79899 Other long term (current) drug therapy: Secondary | ICD-10-CM | POA: Insufficient documentation

## 2014-05-16 HISTORY — PX: ESOPHAGOGASTRODUODENOSCOPY (EGD) WITH PROPOFOL: SHX5813

## 2014-05-16 HISTORY — PX: BIOPSY: SHX5522

## 2014-05-16 HISTORY — PX: ESOPHAGEAL DILATION: SHX303

## 2014-05-16 LAB — GLUCOSE, CAPILLARY: GLUCOSE-CAPILLARY: 96 mg/dL (ref 70–99)

## 2014-05-16 SURGERY — ESOPHAGOGASTRODUODENOSCOPY (EGD) WITH PROPOFOL
Anesthesia: Monitor Anesthesia Care | Site: Esophagus

## 2014-05-16 MED ORDER — STERILE WATER FOR IRRIGATION IR SOLN
Status: DC | PRN
  Administered 2014-05-16: 10:00:00

## 2014-05-16 MED ORDER — GLYCOPYRROLATE 0.2 MG/ML IJ SOLN
INTRAMUSCULAR | Status: AC
  Filled 2014-05-16: qty 1

## 2014-05-16 MED ORDER — MIDAZOLAM HCL 2 MG/2ML IJ SOLN
1.0000 mg | INTRAMUSCULAR | Status: DC | PRN
  Administered 2014-05-16 (×2): 2 mg via INTRAVENOUS

## 2014-05-16 MED ORDER — PROPOFOL INFUSION 10 MG/ML OPTIME
INTRAVENOUS | Status: DC | PRN
  Administered 2014-05-16: 45 ug/kg/min via INTRAVENOUS

## 2014-05-16 MED ORDER — MIDAZOLAM HCL 5 MG/5ML IJ SOLN
INTRAMUSCULAR | Status: DC | PRN
  Administered 2014-05-16: 2 mg via INTRAVENOUS

## 2014-05-16 MED ORDER — ONDANSETRON HCL 4 MG/2ML IJ SOLN
INTRAMUSCULAR | Status: AC
  Filled 2014-05-16: qty 2

## 2014-05-16 MED ORDER — GLYCOPYRROLATE 0.2 MG/ML IJ SOLN
0.2000 mg | Freq: Once | INTRAMUSCULAR | Status: AC
  Administered 2014-05-16: 0.2 mg via INTRAVENOUS

## 2014-05-16 MED ORDER — STERILE WATER FOR IRRIGATION IR SOLN
Status: DC | PRN
  Administered 2014-05-16: 1000 mL

## 2014-05-16 MED ORDER — FENTANYL CITRATE 0.05 MG/ML IJ SOLN
25.0000 ug | INTRAMUSCULAR | Status: AC
  Administered 2014-05-16 (×2): 25 ug via INTRAVENOUS

## 2014-05-16 MED ORDER — FENTANYL CITRATE 0.05 MG/ML IJ SOLN
INTRAMUSCULAR | Status: AC
  Filled 2014-05-16: qty 2

## 2014-05-16 MED ORDER — MIDAZOLAM HCL 2 MG/2ML IJ SOLN
INTRAMUSCULAR | Status: AC
  Filled 2014-05-16: qty 2

## 2014-05-16 MED ORDER — LIDOCAINE VISCOUS 2 % MT SOLN
OROMUCOSAL | Status: AC
  Filled 2014-05-16: qty 15

## 2014-05-16 MED ORDER — LACTATED RINGERS IV SOLN
INTRAVENOUS | Status: DC
  Administered 2014-05-16: 10:00:00 via INTRAVENOUS

## 2014-05-16 MED ORDER — LIDOCAINE VISCOUS 2 % MT SOLN
15.0000 mL | Freq: Once | OROMUCOSAL | Status: AC
  Administered 2014-05-16: 15 mL via OROMUCOSAL
  Filled 2014-05-16: qty 15

## 2014-05-16 MED ORDER — ONDANSETRON HCL 4 MG/2ML IJ SOLN
4.0000 mg | Freq: Once | INTRAMUSCULAR | Status: AC
  Administered 2014-05-16: 4 mg via INTRAVENOUS

## 2014-05-16 SURGICAL SUPPLY — 31 items
BALLN CRE LF 10-12 240X5.5 (BALLOONS)
BALLN CRE LF 10-12MM 240X5.5 (BALLOONS)
BALLN DILATOR CRE 12-15 240 (BALLOONS)
BALLN DILATOR CRE 15-18 240 (BALLOONS) IMPLANT
BALLN DILATOR CRE 18-20 240 (BALLOONS) IMPLANT
BALLN DILATOR CRE WIREGUIDE (BALLOONS)
BALLOON CRE LF 10-12 240X5.5 (BALLOONS) IMPLANT
BALLOON DILATOR CRE 12-15 240 (BALLOONS) IMPLANT
BALLOON DILATOR CRE WIREGUIDE (BALLOONS) IMPLANT
BLOCK BITE 60FR ADLT L/F BLUE (MISCELLANEOUS) ×4 IMPLANT
DEVICE CLIP HEMOSTAT 235CM (CLIP) IMPLANT
ELECT REM PT RETURN 9FT ADLT (ELECTROSURGICAL)
ELECTRODE REM PT RTRN 9FT ADLT (ELECTROSURGICAL) IMPLANT
FLOOR PAD 36X40 (MISCELLANEOUS) ×4
FORCEPS BIOP RAD 4 LRG CAP 4 (CUTTING FORCEPS) ×4 IMPLANT
FORMALIN 10 PREFIL 20ML (MISCELLANEOUS) ×4 IMPLANT
KIT CLEAN ENDO COMPLIANCE (KITS) ×4 IMPLANT
MANIFOLD NEPTUNE II (INSTRUMENTS) ×4 IMPLANT
NEEDLE SCLEROTHERAPY 25GX240 (NEEDLE) IMPLANT
PAD FLOOR 36X40 (MISCELLANEOUS) ×2 IMPLANT
PROBE APC STR FIRE (PROBE) IMPLANT
PROBE INJECTION GOLD (MISCELLANEOUS)
PROBE INJECTION GOLD 7FR (MISCELLANEOUS) IMPLANT
SNARE ROTATE MED OVAL 20MM (MISCELLANEOUS) IMPLANT
SNARE SHORT THROW 13M SML OVAL (MISCELLANEOUS) ×4 IMPLANT
SYR 50ML LL SCALE MARK (SYRINGE) ×4 IMPLANT
SYR INFLATE BILIARY GAUGE (MISCELLANEOUS) IMPLANT
SYR INFLATION 60ML (SYRINGE) ×4 IMPLANT
TUBING ENDO SMARTCAP PENTAX (MISCELLANEOUS) ×4 IMPLANT
TUBING IRRIGATION ENDOGATOR (MISCELLANEOUS) ×4 IMPLANT
WATER STERILE IRR 1000ML POUR (IV SOLUTION) ×4 IMPLANT

## 2014-05-16 NOTE — Op Note (Signed)
[facility] [address] [place], [id number]   ENDOSCOPY PROCEDURE REPORT  PATIENT: Melissa Holland, Melissa P.  MR#: [id number] BIRTHDATE: Jan 23, 1946 , 68  yrs. old GENDER: Female ENDOSCOPIST: R.  Roetta SessionsMichael Deedee Lybarger, MD FACP [facility] REFERRED BY:  Kari BaarsEdward Hawkins, M.D. PROCEDURE DATE:  05/16/2014 PROCEDURE:     EGD with gastric biopsy and Maloney dilation  INDICATIONS:     Dysphagia; epigastric pain  INFORMED CONSENT:   The risks, benefits, limitations, alternatives and imponderables have been discussed.  The potential for biopsy, esophogeal dilation, etc. have also been reviewed.  Questions have been answered.  All parties agreeable.  Please see the history and physical in the medical record for more information.  MEDICATIONS:   Deep sedation per [facility].  DESCRIPTION OF PROCEDURE:   The     endoscope was introduced through the mouth and advanced to the second portion of the duodenum without difficulty or limitations.  The mucosal surfaces were surveyed very carefully during advancement of the scope and upon withdrawal.  Retroflexion view of the proximal stomach and esophagogastric junction was performed.      FINDINGS:  Inlet patch; otherwise, normal appearing, patent tubular esophagus. Stomach empty. Antral erosions present with a single 3 mm prepyloric antral ulcer. This appeared to be a benign lesion. Otherwise,  the gastric mucosa appeared normal. Patent pylorus. Normal first and second portion of the duodenum.  THERAPEUTIC / DIAGNOSTIC MANEUVERS PERFORMED:  The abnormal antral mucosa including ulcer area was biopsied. A 54 French Maloney dilator was passed to full insertion easily. A look back revealed no apparent complication related to this maneuver.   COMPLICATIONS:  None  IMPRESSION:  Esophageal inlet patch  - otherwise normal-appearing esophagus - status post dilation as described above. Antral erosions and a single gastric  ulcer-status post gastric biopsy  RECOMMENDATIONS:   Begin twice a day PPI therapy the the way of Protonix 40 mg;  Avoid nonsteroidals. Followup on pathology. Office visit in 3 months. Patient may still benefit from further evaluation of her pancreas via endoscopic  ultrasound.   _______________________________ R. Roetta SessionsMichael Kory Rains, MD FACP [facility] eSigned:  R. Roetta SessionsMichael Antigone Crowell, MD FACP [facility] 05/16/2014 10:31 AM     CC:  PATIENT NAME:  Melissa Holland, Melissa P. MR#: [id number]

## 2014-05-16 NOTE — Telephone Encounter (Signed)
Per Dr. Jena Gaussourk, Proton[facility] 40 mg [facility], one bid with 3 refills.

## 2014-05-16 NOTE — Telephone Encounter (Signed)
Thanks

## 2014-05-16 NOTE — Anesthesia Postprocedure Evaluation (Signed)
  Anesthesia Post-op Note  Patient: Romelle StarcherJacqueline P Racey  Procedure(s) Performed: Procedure(s) with comments: ESOPHAGOGASTRODUODENOSCOPY (EGD) WITH PROPOFOL (N/A) ESOPHAGEAL DILATION (N/A) - #56 maloney BIOPSY of stomach (N/A)  Patient Location: PACU  Anesthesia Type:MAC  Level of Consciousness: awake, alert , oriented and patient cooperative  Airway and Oxygen Therapy: Patient Spontanous Breathing  Post-op Pain: none  Post-op Assessment: Post-op Vital signs reviewed, Patient's Cardiovascular Status Stable, Respiratory Function Stable and Patent Airway  Post-op Vital Signs: Reviewed and stable  Last Vitals:  Filed Vitals:   05/16/14 1000  BP: 93/43  Pulse:   Temp:   Resp: 25    Complications: No apparent anesthesia complications

## 2014-05-16 NOTE — Discharge Instructions (Addendum)
EGD Discharge instructions Please read the instructions outlined below and refer to this sheet in the next few weeks. These discharge instructions provide you with general information on caring for yourself after you leave the hospital. Your doctor may also give you specific instructions. While your treatment has been planned according to the most current medical practices available, unavoidable complications occasionally occur. If you have any problems or questions after discharge, please call your doctor. ACTIVITY  You may resume your regular activity but move at a slower pace for the next 24 hours.   Take frequent rest periods for the next 24 hours.   Walking will help expel (get rid of) the air and reduce the bloated feeling in your abdomen.   No driving for 24 hours (because of the anesthesia (medicine) used during the test).   You may shower.   Do not sign any important legal documents or operate any machinery for 24 hours (because of the anesthesia used during the test).  NUTRITION  Drink plenty of fluids.   You may resume your normal diet.   Begin with a light meal and progress to your normal diet.   Avoid alcoholic beverages for 24 hours or as instructed by your caregiver.  MEDICATIONS  You may resume your normal medications unless your caregiver tells you otherwise.  WHAT YOU CAN EXPECT TODAY  You may experience abdominal discomfort such as a feeling of fullness or gas pains.  FOLLOW-UP  Your doctor will discuss the results of your test with you.  SEEK IMMEDIATE MEDICAL ATTENTION IF ANY OF THE FOLLOWING OCCUR:  Excessive nausea (feeling sick to your stomach) and/or vomiting.   Severe abdominal pain and distention (swelling).   Trouble swallowing.   Temperature over 101 F (37.8 C).   Rectal bleeding or vomiting of blood.   Information on peptic ulcer disease provided  Avoid nonsteroidal agents like Advil and Aleve. May take a baby aspirin daily  Begin  Protonix 40 mg twice daily  Office visit with us in 3 months  You may need further evaluation of your pancreas by endoscopic ultrasound in the future.  Peptic Ulcer A peptic ulcer is a sore in the lining of in your esophagus (esophageal ulcer), stomach (gastric ulcer), or in the first part of your small intestine (duodenal ulcer). The ulcer causes erosion into the deeper tissue. CAUSES  Normally, the lining of the stomach and the small intestine protects itself from the acid that digests food. The protective lining can be damaged by:  An infection caused by a bacterium called Helicobacter pylori (H. pylori).  Regular use of nonsteroidal anti-inflammatory drugs (NSAIDs), such as ibuprofen or aspirin.  Smoking tobacco. Other risk factors include being older than 50, drinking alcohol excessively, and having a family history of ulcer disease.  SYMPTOMS   Burning pain or gnawing in the area between the chest and the belly button.  Heartburn.  Nausea and vomiting.  Bloating. The pain can be worse on an empty stomach and at night. If the ulcer results in bleeding, it can cause:  Black, tarry stools.  Vomiting of bright red blood.  Vomiting of coffee ground looking materials. DIAGNOSIS  A diagnosis is usually made based upon your history and an exam. Other tests and procedures may be performed to find the cause of the ulcer. Finding a cause will help determine the best treatment. Tests and procedures may include:  Blood tests, stool tests, or breath tests to check for the bacterium H. pylori.  An  upper gastrointestinal (GI) series of the esophagus, stomach, and small intestine.  An endoscopy to examine the esophagus, stomach, and small intestine.  A biopsy. TREATMENT  Treatment may include:  Eliminating the cause of the ulcer, such as smoking, NSAIDs, or alcohol.  Medicines to reduce the amount of acid in your digestive tract.  Antibiotic medicines if the ulcer is caused by  the H. pylori bacterium.  An upper endoscopy to treat a bleeding ulcer.  Surgery if the bleeding is severe or if the ulcer created a hole somewhere in the digestive system. HOME CARE INSTRUCTIONS   Avoid tobacco, alcohol, and caffeine. Smoking can increase the acid in the stomach, and continued smoking will impair the healing of ulcers.  Avoid foods and drinks that seem to cause discomfort or aggravate your ulcer.  Only take medicines as directed by your caregiver. Do not substitute over-the-counter medicines for prescription medicines without talking to your caregiver.  Keep any follow-up appointments and tests as directed. SEEK MEDICAL CARE IF:   Your do not improve within 7 days of starting treatment.  You have ongoing indigestion or heartburn. SEEK IMMEDIATE MEDICAL CARE IF:   You have sudden, sharp, or persistent abdominal pain.  You have bloody or dark black, tarry stools.  You vomit blood or vomit that looks like coffee grounds.  You become light headed, weak, or feel faint.  You become sweaty or clammy. MAKE SURE YOU:   Understand these instructions.  Will watch your condition.  Will get help right away if you are not doing well or get worse.

## 2014-05-16 NOTE — Transfer of Care (Signed)
Immediate Anesthesia Transfer of Care Note  Patient: Romelle StarcherJacqueline P Abbey  Procedure(s) Performed: Procedure(s) with comments: ESOPHAGOGASTRODUODENOSCOPY (EGD) WITH PROPOFOL (N/A) ESOPHAGEAL DILATION (N/A) - #56 maloney BIOPSY of stomach (N/A)  Patient Location: PACU  Anesthesia Type:MAC  Level of Consciousness: awake and patient cooperative  Airway & Oxygen Therapy: Patient Spontanous Breathing  Post-op Assessment: Report given to PACU RN, Post -op Vital signs reviewed and stable and Patient moving all extremities  Post vital signs: Reviewed and stable  Complications: No apparent anesthesia complications

## 2014-05-16 NOTE — Interval H&P Note (Signed)
History and Physical Interval Note:  05/16/2014 9:56 AM  Romelle StarcherJacqueline P Rubano  has presented today for surgery, with the diagnosis of ABDOMINAL PAIN, EARLY SATIETY, DYSPHAGIA, WEIGHT LOSS, NAUSEA, CONSTIPATION  The various methods of treatment have been discussed with the patient and family. After consideration of risks, benefits and other options for treatment, the patient has consented to  Procedure(s) with comments: ESOPHAGOGASTRODUODENOSCOPY (EGD) WITH PROPOFOL (N/A) - 9:45 ESOPHAGEAL DILATION (N/A) as a surgical intervention .  The patient's history has been reviewed, patient examined, no change in status, stable for surgery.  I have reviewed the patient's chart and labs.  Questions were answered to the patient's satisfaction.     Christopherjame Carnell  No change.  EGD per plan.The risks, benefits, limitations, alternatives and imponderables have been reviewed with the patient. Potential for esophageal dilation, biopsy, etc. have also been reviewed.  Questions have been answered. All parties agreeable.

## 2014-05-16 NOTE — H&P (View-Only) (Signed)
Quick Note:  See telephone note 03/26/14 for plan regarding dilated pancreatic duct/cyst. ______

## 2014-05-16 NOTE — Anesthesia Preprocedure Evaluation (Signed)
Anesthesia Evaluation  Patient identified by MRN, date of birth, ID band Patient awake    Reviewed: Allergy & Precautions, H&P , NPO status , Patient's Chart, lab work & pertinent test results, reviewed documented beta blocker date and time   Airway Mallampati: II TM Distance: >3 FB     Dental  (+) Edentulous Upper, Edentulous Lower   Pulmonary COPDCurrent Smoker,  breath sounds clear to auscultation        Cardiovascular hypertension, Pt. on home beta blockers and Pt. on medications - angina+ CAD, + Past MI and + Peripheral Vascular Disease Rhythm:Regular Rate:Bradycardia     Neuro/Psych PSYCHIATRIC DISORDERS Anxiety    GI/Hepatic GERD- (dysphagia)  Medicated,  Endo/Other  diabetes, Type 2, Oral Hypoglycemic Agents  Renal/GU      Musculoskeletal   Abdominal   Peds  Hematology   Anesthesia Other Findings   Reproductive/Obstetrics                           Anesthesia Physical Anesthesia Plan  ASA: III  Anesthesia Plan: MAC   Post-op Pain Management:    Induction: Intravenous  Airway Management Planned: Simple Face Mask  Additional Equipment:   Intra-op Plan:   Post-operative Plan:   Informed Consent: I have reviewed the patients History and Physical, chart, labs and discussed the procedure including the risks, benefits and alternatives for the proposed anesthesia with the patient or authorized representative who has indicated his/her understanding and acceptance.     Plan Discussed with:   Anesthesia Plan Comments:         Anesthesia Quick Evaluation

## 2014-05-17 ENCOUNTER — Other Ambulatory Visit: Payer: Self-pay | Admitting: *Deleted

## 2014-05-17 ENCOUNTER — Encounter (HOSPITAL_COMMUNITY): Payer: Self-pay | Admitting: Internal Medicine

## 2014-05-17 DIAGNOSIS — I6529 Occlusion and stenosis of unspecified carotid artery: Secondary | ICD-10-CM

## 2014-05-17 DIAGNOSIS — I739 Peripheral vascular disease, unspecified: Secondary | ICD-10-CM

## 2014-05-17 LAB — GLUCOSE, CAPILLARY: Glucose-Capillary: 104 mg/dL — ABNORMAL HIGH (ref 70–99)

## 2014-05-19 ENCOUNTER — Encounter: Payer: Self-pay | Admitting: Internal Medicine

## 2014-05-20 NOTE — Interval H&P Note (Signed)
History and Physical Interval Note:  05/20/2014 9:15 AM  Melissa StarcherJacqueline P Kidd  has presented today for surgery, with the diagnosis of ABDOMINAL PAIN, EARLY SATIETY, DYSPHAGIA, WEIGHT LOSS, NAUSEA, CONSTIPATION  The various methods of treatment have been discussed with the patient and family. After consideration of risks, benefits and other options for treatment, the patient has consented to  Procedure(s) with comments: ESOPHAGOGASTRODUODENOSCOPY (EGD) WITH PROPOFOL (N/A) ESOPHAGEAL DILATION (N/A) - #56 maloney BIOPSY of stomach (N/A) as a surgical intervention .  The patient's history has been reviewed, patient examined, no change in status, stable for surgery.  I have reviewed the patient's chart and labs.  Questions were answered to the patient's satisfaction.     Robert Rourk  No change

## 2014-05-20 NOTE — H&P (View-Only) (Signed)
Primary Care Physician:  Fredirick MaudlinHAWKINS,EDWARD L, MD  Primary Gastroenterologist:  Roetta SessionsMichael Rourk, MD   Chief Complaint  Patient presents with  . Office Visit    HPI:  Melissa StarcherJacqueline P Skorupski is a 68 y.o. female here to schedule EGD +/-ED in OR with Dr. Jena Gaussourk. She was last seen in April 2015 with complaints of constipation and abdominal pain. She had been referred to us by Dr. Juanetta GoslingHawkins at that time. Prior to that we saw her 2012 at time of colonoscopy. When I last saw her she complained of a 15 pound weight loss over 6 months. Her weight is down 2 more pounds since we last saw her. She reports 10 years ago she weighed 170 pounds his saw nutritionist for "unexplained weight loss and quite at that time. She's very fixated on having no body fat or muscle tone.  She complains of early satiety, postprandial abdominal pain. Frequent nausea. Complains of dysphagia to solid foods. She takes TUMS for heartburn. Complains of poor appetite. Has difficulty having a bowel movement. Takes Dulcolax 4 tablets about every other day. Recently had to try senna and 2-3 fleets enemas but is not having a bowel movement. States she has not had but one stool in 2 weeks. Complains of mid abdominal pain. Positive flatus. Previously he had nausea and vomiting related to the Amitiza, Linzess. Can't "swallow" Miralax.   Since her last office visit she had a CT of the abdomen and pelvis that showed a significant stool burden but no obstruction. Mild chronic intra-and extrahepatic biliary dilation and dilation of the pancreatic duct. Stable small pancreatic cystic lesion that connect with the pancreatic duct in the pancreatic body consistent with a dilated side duct. Significant atherosclerotic change of the abdominal aorta, iliac arteries and branch vessels.     Followed by Dr. Fabienne Brunsharles Fields. 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 Patent superior mesenteric artery. Subsequently underwent  mesenteric angiogram that showed 40% celiac stenosis, 0% SMA stenosis, 40% IMA stenosis. She had 90% stenosis of the left common femoral artery and a left tibial peroneal, and anterior tibial artery. Also had a right lower showed a superficial femoral artery occlusion and occlusion of the right tibial peroneal trunk. This was done in December 2012. No stenting at that time  Current Outpatient Prescriptions  Medication Sig Dispense Refill  . albuterol (2.5 MG/3ML) 0.083% NEBU 3 mL, albuterol (5 MG/ML) 0.5% NEBU 0.5 mL Inhale 2.5 mg into the lungs every 6 (six) hours as needed.       Marland Kitchen. albuterol (VENTOLIN HFA) 108 (90 BASE) MCG/ACT inhaler Inhale 2 puffs into the lungs every 6 (six) hours as needed.      . ALPRAZolam (XANAX) 1 MG tablet Take 1 mg by mouth 4 (four) times daily as needed. For nerves      . aspirin 81 MG tablet Take 81 mg by mouth daily.        . bisacodyl (DULCOLAX) 5 MG EC tablet Take 20 mg by mouth daily as needed. For bowel movement.      . furosemide (LASIX) 20 MG tablet Take 20 mg by mouth as needed.      . metFORMIN (GLUCOPHAGE) 500 MG tablet Take 500 mg by mouth 2 (two) times daily with a meal.       . metoprolol succinate (TOPROL-XL) 25 MG 24 hr tablet Take 12.5 mg by mouth every morning.       . nitroGLYCERIN (NITROSTAT) 0.4 MG SL tablet  Place 1 tablet (0.4 mg total) under the tongue every 5 (five) minutes as needed. For chest pain.  25 tablet  3  . oxyCODONE-acetaminophen (ENDOCET) 10-325 MG per tablet Take 0.5-1 tablets by mouth every 4 (four) hours as needed for pain.       . potassium chloride (KLOR-CON) 10 MEQ CR tablet Take 10 mEq by mouth as needed. When taking Furosemide      . simvastatin (ZOCOR) 40 MG tablet Take 1 tablet (40 mg total) by mouth every evening.  90 tablet  3   No current facility-administered medications for this visit.    Allergies as of 05/06/2014 - Review Complete 05/06/2014  Allergen Reaction Noted  . Penicillins Anaphylaxis   . Sulfonamide  derivatives Anaphylaxis   . Alupent [metaproterenol sulfate] Other (See Comments) 09/10/2011  . Azithromycin Other (See Comments)   . Linzess [linaclotide] Nausea And Vomiting 05/06/2014  . Omnipaque [iohexol] Other (See Comments) 09/10/2011  . Paroxetine Other (See Comments) 04/09/2010  . Varenicline tartrate Other (See Comments) 04/09/2010  . Benadryl [diphenhydramine hcl] Other (See Comments) 03/20/2012  . Hydrocortisone Hives 03/20/2012  . Prednisone Hives 03/20/2012    Past Medical History  Diagnosis Date  . Diabetes mellitus   . Coronary artery disease     Severe single-vessel/100% occluded right with distal collateralization.Medical therapy/Normal LV fx  . Carotid bruit     Bilateral  . Sinus bradycardia   . Dyslipidemia   . DVT (deep venous thrombosis)     Remote portable as him   . COPD (chronic obstructive pulmonary disease)   . Arthritis   . Depression with anxiety   . Leg pain   . Productive cough   . Peripheral arterial disease   . Carotid artery occlusion     Past Surgical History  Procedure Laterality Date  . Parotidectomy      Left  . Shoulder blade      Cysts x 2  . Tonsillectomy and adenoidectomy    . Partial hysterectomy      Subsequent completion later that year,hx of large fibroid tumor  . Appendectomy    . Aortogram      with bilateral lower extremity run-off  . Abdominal hysterectomy    . Colonoscopy  12/31/2010    BJY:NWGNRMR:Long tortuous, but otherwise normal-appearing colon/ Normal rectum    Family History  Problem Relation Age of Onset  . Cancer Maternal Grandmother     liver  . Cancer Maternal Grandfather     lung  . Diabetes Mother   . Diabetes Son     History   Social History  . Marital Status: Widowed    Spouse Name: N/A    Number of Children: N/A  . Years of Education: N/A   Occupational History  . Not on file.   Social History Main Topics  . Smoking status: Current Every Day Smoker -- 1.00 packs/day for 52 years     Types: Cigarettes  . Smokeless tobacco: Never Used     Comment: states not smoking as much  . Alcohol Use: No  . Drug Use: No  . Sexual Activity: Not on file   Other Topics Concern  . Not on file   Social History Narrative  . No narrative on file      ROS:  General: see hpi. Eyes: Negative for vision changes.  ENT: Negative for hoarseness, difficulty swallowing , nasal congestion. CV: Negative for chest pain, angina, palpitations, dyspnea on exertion, peripheral edema.  Respiratory: Negative  for dyspnea at rest, dyspnea on exertion, cough, sputum, wheezing.  GI: See history of present illness. GU:  Negative for dysuria, hematuria, urinary incontinence, urinary frequency, nocturnal urination.  MS: Negative for joint pain, low back pain.  Derm: Negative for rash or itching.  Neuro: Negative for weakness, abnormal sensation, seizure, frequent headaches, memory loss, confusion.  Psych: +for anxiety, depression. No suicidal ideation, hallucinations.  Endo:see hpi Heme: Negative for bruising or bleeding. Allergy: Negative for rash or hives.    Physical Examination:  BP 131/64  Pulse 57  Temp(Src) 96.8 F (36 C) (Oral)  Resp 16  Ht 5\' 5"  (1.651 m)  Wt 108 lb (48.988 kg)  BMI 17.97 kg/m2   General: Well-nourished, well-developed in no acute distress.  Head: Normocephalic, atraumatic.   Eyes: Conjunctiva pink, no icterus. Mouth: Oropharyngeal mucosa moist and pink , no lesions erythema or exudate. Neck: Supple without thyromegaly, masses, or lymphadenopathy.  Lungs: Clear to auscultation bilaterally.  Heart: Regular rate and rhythm, no murmurs rubs or gallops.  Abdomen: Bowel sounds are normal, mild lower abdominal tenderness to deep palpation, nondistended, no hepatosplenomegaly or masses, no abdominal bruits or    hernia , no rebound or guarding.  With standing, patient's abd more protruberant, likely related to week abdominal wall muscles.  Rectal: no external findings.  Small amount of soft brown, heme negative stool on exam. NT. No impaction. Extremities: No lower extremity edema. No clubbing or deformities.  Neuro: Alert and oriented x 4 , grossly normal neurologically.  Skin: Warm and dry, no rash or jaundice.   Psych: Alert and cooperative, normal mood and affect.

## 2014-05-21 ENCOUNTER — Telehealth: Payer: Self-pay | Admitting: Internal Medicine

## 2014-05-21 NOTE — Telephone Encounter (Signed)
LMOM at 742 pm last night that she was wanting her results from her recent procedure. She has a FU OV for Sept and she thinks that she is going to have to wait that long to get her results. Pt jumped from one subject to the next about what her pharmacist told her to eat to help her swallow her medicines and then started on about no one at the hospital told her anything that was going on. All she remembered was getting out of the car that morning. Please explain to patient that it takes 7-10 business days before we get the results and [facility] will be sending her a letter telling her what her results are that she will not have to wait until September to get them. Sept is a follow up visit from her procedure.

## 2014-05-22 ENCOUNTER — Encounter: Payer: Self-pay | Admitting: Vascular Surgery

## 2014-05-22 ENCOUNTER — Telehealth: Payer: Self-pay

## 2014-05-22 NOTE — Telephone Encounter (Signed)
Letter mailed to pt.  

## 2014-05-22 NOTE — Telephone Encounter (Signed)
Letter from: Corbin Adeourk, Robert M  Reason for Letter: Results Review  Send letter to patient.  Send copy of letter with path to referring provider and PCP.  Need ov w extender 3 mos

## 2014-05-22 NOTE — Telephone Encounter (Signed)
Per JL letter of her results were mailed to her and pt is aware of follow up OV in Sept 2015

## 2014-05-23 ENCOUNTER — Ambulatory Visit (INDEPENDENT_AMBULATORY_CARE_PROVIDER_SITE_OTHER)
Admission: RE | Admit: 2014-05-23 | Discharge: 2014-05-23 | Disposition: A | Discharge status: Home / Self Care | Payer: Medicare Other | Source: Ambulatory Visit | Attending: Vascular Surgery | Admitting: Vascular Surgery

## 2014-05-23 ENCOUNTER — Ambulatory Visit (HOSPITAL_COMMUNITY)
Admission: RE | Admit: 2014-05-23 | Discharge: 2014-05-23 | Disposition: A | Discharge status: Home / Self Care | Payer: Medicare Other | Source: Ambulatory Visit | Attending: Vascular Surgery | Admitting: Vascular Surgery

## 2014-05-23 ENCOUNTER — Ambulatory Visit (INDEPENDENT_AMBULATORY_CARE_PROVIDER_SITE_OTHER): Payer: Medicare Other | Admitting: Vascular Surgery

## 2014-05-23 ENCOUNTER — Encounter: Payer: Self-pay | Admitting: Vascular Surgery

## 2014-05-23 VITALS — BP 146/63 | HR 54 | Resp 16 | Ht 63.0 in | Wt 106.0 lb

## 2014-05-23 DIAGNOSIS — I739 Peripheral vascular disease, unspecified: Secondary | ICD-10-CM | POA: Insufficient documentation

## 2014-05-23 DIAGNOSIS — I6529 Occlusion and stenosis of unspecified carotid artery: Secondary | ICD-10-CM

## 2014-05-23 DIAGNOSIS — F17200 Nicotine dependence, unspecified, uncomplicated: Secondary | ICD-10-CM

## 2014-05-23 NOTE — Telephone Encounter (Signed)
Results Cc to PCP  

## 2014-05-23 NOTE — Progress Notes (Signed)
[facility] [place] HISTORY AND PHYSICAL    History of Present Illness:  Patient is a 68 y.o. year old female who presents for evaluation of peripheral arterial disease and asymptomatic moderate carotid stenosis.   She denies any symptoms of TIA amaurosis or stroke.  She continues to complain of pain in her lower extremities. She states that the left leg is the worst. She develops cramping and tightness in her leg after walking. She denies rest pain. She has no history of nonhealing wounds on her feet. She is a very difficult historian and tends to jump from place to place and is hard to keep on task.  She continues to smoke. Greater than 3 minutes they were spent regarding smoking cessation counseling. Other medical problems include diabetes, coronary disease, depression and anxiety. These are currently controlled and followed primarily by Dr. Juanetta GoslingHawkins and Dr. Antoine PocheHochrein    Past Medical History   Diagnosis  Date   .  Diabetes mellitus     .  Coronary artery disease         Severe single-vessel/100% occluded right with distal collateralization.Medical therapy/Normal LV fx   .  Carotid bruit         Bilateral   .  Sinus bradycardia     .  Dyslipidemia     .  DVT (deep venous thrombosis)         Remote portable as him    .  COPD (chronic obstructive pulmonary disease)     .  Arthritis     .  Depression with anxiety     .  Leg pain     .  Productive cough     .  Peripheral arterial disease     .  Carotid artery occlusion         Past Surgical History   Procedure  Date   .  Parotidectomy         Left   .  Shoulder blade         Cysts x 2   .  Tonsillectomy and adenoidectomy     .  Partial hysterectomy         Subsequent completion later that year,hx of large fibroid tumor   .  Appendectomy     .  Aortogram         with bilateral lower extremity run-off   .  Abdominal hysterectomy        Social History History   Substance Use Topics   .  Smoking status:   Current Everyday Smoker -- 1.0 packs/day for 52 years       Types:  Cigarettes   .  Smokeless tobacco:  Never Used     Comment: pt states she will be starting patches today   .  Alcohol Use:  No     Family History Family History   Problem  Relation  Age of Onset   .  Cancer  Maternal Grandmother         liver   .  Cancer  Maternal Grandfather         lung   .  Diabetes  Mother     .  Diabetes  Son       Allergies    Allergies   Allergen  Reactions   .  Penicillins  Anaphylaxis   .  Sulfonamide Derivatives  Anaphylaxis   .  Alupent (Metaproterenol Sulfate)  Other (See Comments)  Makes pt "nervous"   .  Azithromycin  Other (See Comments)       unknown   .  Omnipaque (Iohexol)  Other (See Comments)       unknown   .  Paroxetine  Other (See Comments)       unknown   .  Varenicline Tartrate  Other (See Comments)       unknown   .  Benadryl (Diphenhydramine Hcl)  Other (See Comments)       Can't breath   .  Hydrocortisone  Hives   .  Prednisone  Hives        Current Outpatient Prescriptions   Medication  Sig  Dispense  Refill   .  albuterol (2.5 MG/3ML) 0.083% NEBU 3 mL, albuterol (5 MG/ML) 0.5% NEBU 0.5 mL  Inhale 2.5 mg into the lungs every 6 (six) hours.         Marland Kitchen  albuterol (VENTOLIN HFA) 108 (90 BASE) MCG/ACT inhaler  Inhale 2 puffs into the lungs every 6 (six) hours as needed.         .  ALPRAZolam (XANAX) 1 MG tablet  Take 1 mg by mouth 4 (four) times daily as needed. For nerves         .  amLODipine (NORVASC) 5 MG tablet  Take 1 tablet (5 mg total) by mouth daily.   90 tablet   3   .  aspirin 81 MG tablet  Take 81 mg by mouth daily.           Marland Kitchen  atorvastatin (LIPITOR) 40 MG tablet  Take 1 tablet (40 mg total) by mouth every evening.   90 tablet   3   .  bisacodyl (DULCOLAX) 5 MG EC tablet  Take 20 mg by mouth daily as needed. For bowel movement.         .  furosemide (LASIX) 20 MG tablet  Take 20 mg by mouth as needed. For swelling.         Marland Kitchen   HYDROcodone-acetaminophen (NORCO) 7.5-325 MG per tablet  Take 1 tablet by mouth every 6 (six) hours as needed. For pain.         .  isosorbide mononitrate (IMDUR) 60 MG 24 hr tablet  Take 1 tablet (60 mg total) by mouth every morning.   90 tablet   2   .  lactose free nutrition (BOOST) LIQD  Take 1 Container by mouth daily. Calorie Smart         .  metFORMIN (GLUCOPHAGE) 500 MG tablet  Take 1,000 mg by mouth 2 (two) times daily with a meal.           .  metoprolol succinate (TOPROL-XL) 25 MG 24 hr tablet  Take 12.5 mg by mouth every morning.          .  nitroGLYCERIN (NITROSTAT) 0.4 MG SL tablet  Place 1 tablet (0.4 mg total) under the tongue every 5 (five) minutes as needed. For chest pain.   25 tablet   3   .  potassium chloride (KLOR-CON) 10 MEQ CR tablet  Take 10 mEq by mouth as needed. When taking lasix.           ROS:    General:  No weight loss, Fever, chills  HEENT: No recent headaches, no nasal bleeding, no visual changes, no sore throat  Neurologic: No dizziness, blackouts, seizures. No recent symptoms of stroke or mini- stroke. No recent episodes of slurred speech,  or temporary blindness.  Cardiac: No recent episodes of chest pain/pressure, no shortness of breath at rest.  No shortness of breath with exertion.  Denies history of atrial fibrillation or irregular heartbeat  Vascular: No history of rest pain in feet.  No history of non-healing ulcer  Pulmonary: No home oxygen, no productive cough, no hemoptysis,  No asthma or wheezing  Musculoskeletal:  [ ]  Arthritis, [ ]  Low back pain,  [ ]  Joint pain  Hematologic:No history of hypercoagulable state.  No history of easy bleeding.  No history of anemia  Gastrointestinal: No hematochezia or melena,  No gastroesophageal reflux  Urinary: [ ]  chronic Kidney disease, [ ]  on HD - [ ]  MWF or [ ]  TTHS, [ ]  Burning with urination, [ ]  Frequent urination, [ ]  Difficulty urinating;    Skin: No rashes  Psychological: +history of anxiety,   + history of depression   Physical Examination     Filed Vitals:   05/23/14 1520 05/23/14 1522  BP: 144/67 146/63  Pulse: 60 54  Resp: 16   Height: 5\' 3"  (1.6 m)   Weight: 106 lb (48.081 kg)    General:  Alert and oriented, no acute distress HEENT: Normal Neck: + left  bruit   Pulmonary: Clear to auscultation bilaterally Cardiac: Regular Rate and Rhythm without murmur Abdomen: Soft, non-tender, non-distended, no mass Skin: No rash, dependent rubor both feet Extremity Pulses:  2+ radial, brachial, femoral, absent dorsalis pedis, posterior tibial pulses bilaterally Musculoskeletal: No deformity or edema     Neurologic: Upper and lower extremity motor 5/5 and symmetric  DATA: She had bilateral ABIs and carotid  duplex exam today.  I reviewed and interpreted the studies. Carotid duplex scan shows 60-80% stenosis left side, 40-60 percent right side. ABI was 0.44 right 0.51 left compared to 0.73 on the left 0.55 on the right 2 years ago   ASSESSMENT:   #1 moderate carotid stenosis asymptomatic continue antiplatelet therapy and risk factor modification #2 bilateral lower extremity claudication which has become progressive was slowly declining ABI. Unfortunately the patient continues to smoke and she was counseled against this for greater than 3 minutes again today.   PLAN:  Followup 6 months carotid duplex.  Aortogram bilateral lower extremity runoff possible intervention on July 31. Risks benefits possible complications procedure details were explained the patient today including not limited to bleeding infection vessel injury contrast reaction. She insisted that she stop her Glucophage 3 days prior and 3 days after her arteriogram.  Of note she apparently had a heating discussion regarding her medications and there side of action with one of our vascular lab techs.  I apologized to her for the problem that apparently happened in the vascular lab.   Fabienne Brunsharles Fields, MD [facility] [place] Office: [phone] Pager: [phone]

## 2014-05-30 ENCOUNTER — Other Ambulatory Visit: Payer: Self-pay | Admitting: *Deleted

## 2014-06-04 ENCOUNTER — Telehealth: Payer: Self-pay

## 2014-06-04 NOTE — Telephone Encounter (Signed)
Patient called to cancel procedure that was scheduled for 06/28/14. She states that she had a procedure done by Dr. Jena Gaussourk 05/16/14 and has not yet fully recovered. Patient states that she will call our office to reschedule aortogram when she gets to feeling better.

## 2014-06-27 ENCOUNTER — Other Ambulatory Visit: Payer: Self-pay

## 2014-06-28 ENCOUNTER — Encounter (HOSPITAL_COMMUNITY): Admission: RE | Payer: Self-pay | Source: Ambulatory Visit

## 2014-06-28 ENCOUNTER — Ambulatory Visit (HOSPITAL_COMMUNITY): Admission: RE | Admit: 2014-06-28 | Payer: Medicare Other | Source: Ambulatory Visit | Admitting: Vascular Surgery

## 2014-06-28 SURGERY — ABDOMINAL AORTAGRAM
Anesthesia: LOCAL

## 2014-07-19 ENCOUNTER — Other Ambulatory Visit: Payer: Self-pay | Admitting: *Deleted

## 2014-07-19 ENCOUNTER — Ambulatory Visit (HOSPITAL_COMMUNITY)
Admission: RE | Admit: 2014-07-19 | Discharge: 2014-07-19 | Disposition: A | Discharge status: Home / Self Care | Payer: Medicare Other | Source: Ambulatory Visit | Attending: Vascular Surgery | Admitting: Vascular Surgery

## 2014-07-19 ENCOUNTER — Encounter (HOSPITAL_COMMUNITY)
Admission: RE | Disposition: A | Discharge status: Home / Self Care | Payer: Self-pay | Source: Ambulatory Visit | Attending: Vascular Surgery

## 2014-07-19 ENCOUNTER — Telehealth: Payer: Self-pay | Admitting: Vascular Surgery

## 2014-07-19 DIAGNOSIS — I739 Peripheral vascular disease, unspecified: Secondary | ICD-10-CM

## 2014-07-19 DIAGNOSIS — F3289 Other specified depressive episodes: Secondary | ICD-10-CM | POA: Diagnosis not present

## 2014-07-19 DIAGNOSIS — J449 Chronic obstructive pulmonary disease, unspecified: Secondary | ICD-10-CM | POA: Insufficient documentation

## 2014-07-19 DIAGNOSIS — F411 Generalized anxiety disorder: Secondary | ICD-10-CM | POA: Diagnosis not present

## 2014-07-19 DIAGNOSIS — E119 Type 2 diabetes mellitus without complications: Secondary | ICD-10-CM | POA: Insufficient documentation

## 2014-07-19 DIAGNOSIS — F329 Major depressive disorder, single episode, unspecified: Secondary | ICD-10-CM | POA: Diagnosis not present

## 2014-07-19 DIAGNOSIS — Z7982 Long term (current) use of aspirin: Secondary | ICD-10-CM | POA: Diagnosis not present

## 2014-07-19 DIAGNOSIS — J4489 Other specified chronic obstructive pulmonary disease: Secondary | ICD-10-CM | POA: Insufficient documentation

## 2014-07-19 DIAGNOSIS — E785 Hyperlipidemia, unspecified: Secondary | ICD-10-CM | POA: Diagnosis not present

## 2014-07-19 DIAGNOSIS — I2510 Atherosclerotic heart disease of native coronary artery without angina pectoris: Secondary | ICD-10-CM | POA: Insufficient documentation

## 2014-07-19 DIAGNOSIS — F17200 Nicotine dependence, unspecified, uncomplicated: Secondary | ICD-10-CM | POA: Insufficient documentation

## 2014-07-19 DIAGNOSIS — Z01810 Encounter for preprocedural cardiovascular examination: Secondary | ICD-10-CM

## 2014-07-19 DIAGNOSIS — I70219 Atherosclerosis of native arteries of extremities with intermittent claudication, unspecified extremity: Secondary | ICD-10-CM

## 2014-07-19 HISTORY — PX: ABDOMINAL AORTAGRAM: SHX5454

## 2014-07-19 LAB — POCT I-STAT, CHEM 8
BUN: 11 mg/dL (ref 6–23)
CREATININE: 0.7 mg/dL (ref 0.50–1.10)
Calcium, Ion: 1.16 mmol/L (ref 1.13–1.30)
Chloride: 99 mEq/L (ref 96–112)
Glucose, Bld: 153 mg/dL — ABNORMAL HIGH (ref 70–99)
HCT: 47 % — ABNORMAL HIGH (ref 36.0–46.0)
HEMOGLOBIN: 16 g/dL — AB (ref 12.0–15.0)
Potassium: 3.8 mEq/L (ref 3.7–5.3)
SODIUM: 134 meq/L — AB (ref 137–147)
TCO2: 27 mmol/L (ref 0–100)

## 2014-07-19 LAB — GLUCOSE, CAPILLARY: Glucose-Capillary: 119 mg/dL — ABNORMAL HIGH (ref 70–99)

## 2014-07-19 SURGERY — ABDOMINAL AORTAGRAM
Anesthesia: LOCAL

## 2014-07-19 MED ORDER — ONDANSETRON HCL 4 MG/2ML IJ SOLN: 4.0000 mg | Freq: Four times a day (QID) | INTRAMUSCULAR | Status: DC | PRN

## 2014-07-19 MED ORDER — ONDANSETRON HCL 4 MG/2ML IJ SOLN: 4.0000 mg | Freq: Once | INTRAMUSCULAR | Status: DC | PRN

## 2014-07-19 MED ORDER — LABETALOL HCL 5 MG/ML IV SOLN: 10.0000 mg | INTRAVENOUS | Status: DC | PRN

## 2014-07-19 MED ORDER — ALPRAZOLAM 0.25 MG PO TABS
ORAL_TABLET | ORAL | Status: AC
  Administered 2014-07-19: 0.5 mg via ORAL
  Filled 2014-07-19: qty 2

## 2014-07-19 MED ORDER — MORPHINE SULFATE 10 MG/ML IJ SOLN: 2.0000 mg | INTRAMUSCULAR | Status: DC | PRN

## 2014-07-19 MED ORDER — HYDRALAZINE HCL 20 MG/ML IJ SOLN
INTRAMUSCULAR | Status: AC
Start: 2014-07-19 — End: 2014-07-19
  Filled 2014-07-19: qty 1

## 2014-07-19 MED ORDER — ALPRAZOLAM 0.25 MG PO TABS
0.5000 mg | ORAL_TABLET | Freq: Once | ORAL | Status: AC
  Administered 2014-07-19: 0.5 mg via ORAL

## 2014-07-19 MED ORDER — SODIUM CHLORIDE 0.45 % IV SOLN
INTRAVENOUS | Status: DC
  Administered 2014-07-19: 13:00:00 via INTRAVENOUS

## 2014-07-19 MED ORDER — FENTANYL CITRATE 0.05 MG/ML IJ SOLN: 25.0000 ug | INTRAMUSCULAR | Status: DC | PRN

## 2014-07-19 MED ORDER — HEPARIN (PORCINE) IN NACL 2-0.9 UNIT/ML-% IJ SOLN
INTRAMUSCULAR | Status: AC
  Filled 2014-07-19: qty 1000

## 2014-07-19 MED ORDER — METOPROLOL TARTRATE 1 MG/ML IV SOLN: 2.0000 mg | INTRAVENOUS | Status: DC | PRN

## 2014-07-19 MED ORDER — HYDRALAZINE HCL 20 MG/ML IJ SOLN
10.0000 mg | INTRAMUSCULAR | Status: DC | PRN
  Administered 2014-07-19: 10 mg via INTRAVENOUS

## 2014-07-19 MED ORDER — SODIUM CHLORIDE 0.9 % IV SOLN
INTRAVENOUS | Status: DC
  Administered 2014-07-19: 07:00:00 via INTRAVENOUS

## 2014-07-19 MED ORDER — GUAIFENESIN-DM 100-10 MG/5ML PO SYRP
15.0000 mL | ORAL_SOLUTION | ORAL | Status: DC | PRN
  Filled 2014-07-19: qty 15

## 2014-07-19 MED ORDER — PHENOL 1.4 % MT LIQD
1.0000 | OROMUCOSAL | Status: DC | PRN
  Filled 2014-07-19: qty 177

## 2014-07-19 MED ORDER — ACETAMINOPHEN 325 MG PO TABS
325.0000 mg | ORAL_TABLET | ORAL | Status: DC | PRN
  Filled 2014-07-19: qty 2

## 2014-07-19 MED ORDER — ACETAMINOPHEN 325 MG RE SUPP
325.0000 mg | RECTAL | Status: DC | PRN
  Filled 2014-07-19: qty 2

## 2014-07-19 MED ORDER — LIDOCAINE HCL (PF) 1 % IJ SOLN
INTRAMUSCULAR | Status: AC
  Filled 2014-07-19: qty 30

## 2014-07-19 MED ORDER — MORPHINE SULFATE 2 MG/ML IJ SOLN
INTRAMUSCULAR | Status: AC
  Administered 2014-07-19: 2 mg via INTRAVENOUS
  Filled 2014-07-19: qty 1

## 2014-07-19 NOTE — Progress Notes (Signed)
Site area: right groin Site Prior to Removal:  Level 0 Pressure Applied For: 25 minutes Manual:   yes Patient Status During Pull:  stable Post Pull Site:  Level 0 Post Pull Instructions Given:  yes Post Pull Pulses Present: yes and remained present with doppler during sheath pull Dressing Applied:  Tegaderm Bedrest begins @ 13:30:00 Comments: No complications

## 2014-07-19 NOTE — Op Note (Signed)
Procedure: Abdominal aortogram with bilateral lower extremity runoff  Preoperative diagnosis: Claudication  Postoperative diagnosis: Same  Anesthesia: Local  Operative findings: #1 bilateral superficial femoral artery occlusions #2 bilateral common femoral artery stenosis 70% #3 one vessel runoff right foot anterior tibial artery #4 two-vessel runoff left foot anterior tibial and peroneal arteries #5 reconstitution of below-knee popliteal artery bilaterally  Operative details: After obtaining informed consent, the patient was taken to the PV lab. The patient was placed in supine position the Angio table. Both groins were prepped and draped in usual sterile fashion. Local anesthesia was infiltrated over the right common femoral artery. Ultrasound was used to identify the right common femoral artery. There was a large amount of posterior plaque in the common femoral artery. I was able to find a window in anterior wall for cannulation of the artery. The artery was successfully cannulated and an 035 versacore were threaded up in the abdominal aorta under fluoroscopic guidance. Next a 5 French sheath was placed over the guidewire into the right common femoral artery. This was thoroughly flushed with heparinized saline. A 5 French pigtail catheter was then placed over the guidewire up in the abdominal aorta. Abdominal aortogram was obtained and an AP projection. She is left and right renal arteries are patent. The infrarenal abdominal aorta is patent. The left thigh common external and internal iliac arteries are patent. Next the pigtail catheter was pulled in this above the aortic bifurcation and oblique views of the pelvis were obtained. This confirmed the above findings oblique views were performed the 30 RAO and LAO projection. Next bilateral lower extremity runoff views were obtained.  In the right lower extremity, the right common femoral artery has a 70% stenosis near the bifurcation. The superficial  femoral artery is occluded at its origin. The profunda is patent. There several skip segments throughout the right superficial femoral artery occlusion. The below-knee popliteal artery reconstitutes via profunda collaterals. There is one-vessel runoff to the right foot via the anterior tibial artery.  In the left lower extremity, the left common femoral artery has a 70% stenosis near the bifurcation. Superficial femoral artery is occluded at its origin but does reconstitute and fills throughout most of its course in the distal leg. However it is severely diseased. The popliteal artery reconstitutes via collaterals. There is an 80% stenosis the popliteal artery at the knee joint. The below-knee popliteal artery is patent. His two-vessel runoff to the left foot via the anterior tibial and peroneal arteries. At this point the pigtail catheter was removed over a guidewire. The 5 French sheath was left in place to be pulled in the holding area. The patient tolerated the procedure well and there were no complications.  Operative management: The patient was offered a right common femoral endarterectomy and right femoral to below-knee popliteal bypass for relief of claudication symptoms in the right leg. If she wishes at some point future we will follow this with a left femoral to below-knee popliteal bypass. She will need cardiac risk stratification prior to this. She was also counseled today in smoking cessation. She was advised that she continues to smoke she is at high risk of limb loss as well as decreased durability of any revascularization procedure.  Fabienne Brunsharles Caitlen Worth, MD [facility] [place] Office: [phone] Pager: [phone]

## 2014-07-19 NOTE — Discharge Instructions (Signed)
Angiogram, Care After °Refer to this sheet in the next few weeks. These instructions provide you with information on caring for yourself after your procedure. Your health care provider may also give you more specific instructions. Your treatment has been planned according to current medical practices, but problems sometimes occur. Call your health care provider if you have any problems or questions after your procedure.  °WHAT TO EXPECT AFTER THE PROCEDURE °After your procedure, it is typical to have the following sensations: °· Minor discomfort or tenderness and a small bump at the catheter insertion site. The bump should usually decrease in size and tenderness within 1 to 2 weeks. °· Any bruising will usually fade within 2 to 4 weeks. °HOME CARE INSTRUCTIONS  °· You may need to keep taking blood thinners if they were prescribed for you. Take medicines only as directed by your health care provider. °· Do not apply powder or lotion to the site. °· Do not take baths, swim, or use a hot tub until your health care provider approves. °· You may shower 24 hours after the procedure. Remove the bandage (dressing) and gently wash the site with plain soap and water. Gently pat the site dry. °· Inspect the site at least twice daily. °· Limit your activity for the first 48 hours. Do not bend, squat, or lift anything over 20 lb (9 kg) or as directed by your health care provider. °· Plan to have someone take you home after the procedure. Follow instructions about when you can drive or return to work. °SEEK MEDICAL CARE IF: °· You get light-headed when standing up. °· You have drainage (other than a small amount of blood on the dressing). °· You have chills. °· You have a fever. °· You have redness, warmth, swelling, or pain at the insertion site. °SEEK IMMEDIATE MEDICAL CARE IF:  °· You develop chest pain or shortness of breath, feel faint, or pass out. °· You have bleeding, swelling larger than a walnut, or drainage from the  catheter insertion site. °· You develop pain, discoloration, coldness, or severe bruising in the leg or arm that held the catheter. °· You have heavy bleeding from the site. If this happens, hold pressure on the site and call 911. °MAKE SURE YOU: °· Understand these instructions. °· Will watch your condition. °· Will get help right away if you are not doing well or get worse. °Document Released: 06/03/2005 Document Revised: 04/01/2014 Document Reviewed: 04/09/2013 °ExitCare® Patient Information ©2015 [employer]. This information is not intended to replace advice given to you by your health care provider. Make sure you discuss any questions you have with your health care provider. ° °

## 2014-07-19 NOTE — Telephone Encounter (Signed)
Message copied by Fredrich BirksMILLIKAN, DANA P on Fri Jul 19, 2014  4:24 PM ------      Message from: Sharee PimpleMCCHESNEY, MARILYN K      Created: Fri Jul 19, 2014 12:51 PM      Regarding: Schedule                   ----- Message -----         From: Sherren Kernsharles E Fields, MD         Sent: 07/19/2014  12:43 PM           To: [facility] Charge Pool            Aortogram with bilateral runoff right femoral puncture ultrasound.            She needs cardiology eval has seen Dr Antoine PocheHochrein before.            She needs to see me in the office to discuss operative procedure after she sees Hochrein and before operation            Fabienne Brunsharles Fields ------

## 2014-07-19 NOTE — Telephone Encounter (Signed)
Schedule patient with Dr Wyline MoodBranch, as she is already established with him and he sees her at the [place] [facility].  The appointment is for 07/23/2014 @ 11:20 [address] [employer] [place] on [address])  Left message for pt to cb for appt info, dpm

## 2014-07-19 NOTE — H&P (Signed)
[facility] [place] HISTORY AND PHYSICAL    History of Present Illness:  Patient is a 68 y.o. year old female who presents for evaluation of peripheral arterial disease and asymptomatic moderate carotid stenosis.   She denies any symptoms of TIA amaurosis or stroke.  She continues to complain of pain in her lower extremities. She states that the left leg is the worst. She develops cramping and tightness in her leg after walking. She denies rest pain. She has no history of nonhealing wounds on her feet. She is a very difficult historian and tends to jump from place to place and is hard to keep on task.  She continues to smoke. Greater than 3 minutes they were spent regarding smoking cessation counseling. Other medical problems include diabetes, coronary disease, depression and anxiety. These are currently controlled and followed primarily by Dr. [name] and Dr. Antoine Poche    Past Medical History    Diagnosis   Date    .   Diabetes mellitus       .   Coronary artery disease             Severe single-vessel/100% occluded right with distal collateralization.Medical therapy/Normal LV fx    .   Carotid bruit             Bilateral    .   Sinus bradycardia       .   Dyslipidemia       .   DVT (deep venous thrombosis)             Remote portable as him     .   COPD (chronic obstructive pulmonary disease)       .   Arthritis       .   Depression with anxiety       .   Leg pain       .   Productive cough       .   Peripheral arterial disease       .   Carotid artery occlusion           Past Surgical History    Procedure   Date    .   Parotidectomy             Left    .   Shoulder blade             Cysts x 2    .   Tonsillectomy and adenoidectomy       .   Partial hysterectomy             Subsequent completion later that year,hx of large fibroid tumor    .   Appendectomy       .   Aortogram             with bilateral lower extremity run-off    .   Abdominal hysterectomy           Social History History    Substance Use Topics    .   Smoking status:   Current Everyday Smoker -- 1.0 packs/day for 52 years          Types:   Cigarettes    .   Smokeless tobacco:   Never Used       Comment: pt states she will be starting patches today    .   Alcohol Use:   No      Family History Family History  Problem   Relation   Age of Onset    .   Cancer   Maternal Grandmother             liver    .   Cancer   Maternal Grandfather             lung    .   Diabetes   Mother       .   Diabetes   Son         Allergies    Allergies    Allergen   Reactions    .   Penicillins   Anaphylaxis    .   Sulfonamide Derivatives   Anaphylaxis    .   Alupent (Metaproterenol Sulfate)   Other (See Comments)          Makes pt "nervous"    .   Azithromycin   Other (See Comments)          unknown    .   Omnipaque (Iohexol)   Other (See Comments)          unknown    .   Paroxetine   Other (See Comments)          unknown    .   Varenicline Tartrate   Other (See Comments)          unknown    .   Benadryl (Diphenhydramine Hcl)   Other (See Comments)          Can't breath    .   Hydrocortisone   Hives    .   Prednisone   Hives         Current Outpatient Prescriptions    Medication   Sig   Dispense   Refill    .   albuterol (2.5 MG/3ML) 0.083% NEBU 3 mL, albuterol (5 MG/ML) 0.5% NEBU 0.5 mL   Inhale 2.5 mg into the lungs every 6 (six) hours.            Marland Kitchen.   albuterol (VENTOLIN HFA) 108 (90 BASE) MCG/ACT inhaler   Inhale 2 puffs into the lungs every 6 (six) hours as needed.            .   ALPRAZolam (XANAX) 1 MG tablet   Take 1 mg by mouth 4 (four) times daily as needed. For nerves            .   amLODipine (NORVASC) 5 MG tablet   Take 1 tablet (5 mg total) by mouth daily.    90 tablet    3    .   aspirin 81 MG tablet   Take 81 mg by mouth daily.              Marland Kitchen.   atorvastatin (LIPITOR) 40 MG tablet   Take 1 tablet (40 mg total) by mouth every evening.    90 tablet    3    .    bisacodyl (DULCOLAX) 5 MG EC tablet   Take 20 mg by mouth daily as needed. For bowel movement.            .   furosemide (LASIX) 20 MG tablet   Take 20 mg by mouth as needed. For swelling.            Marland Kitchen.   HYDROcodone-acetaminophen (NORCO) 7.5-325 MG per tablet   Take 1 tablet by mouth every 6 (six) hours as needed. For pain.            .Marland Kitchen  isosorbide mononitrate (IMDUR) 60 MG 24 hr tablet   Take 1 tablet (60 mg total) by mouth every morning.    90 tablet    2    .   lactose free nutrition (BOOST) LIQD   Take 1 Container by mouth daily. Calorie Smart            .   metFORMIN (GLUCOPHAGE) 500 MG tablet   Take 1,000 mg by mouth 2 (two) times daily with a meal.              .   metoprolol succinate (TOPROL-XL) 25 MG 24 hr tablet   Take 12.5 mg by mouth every morning.             .   nitroGLYCERIN (NITROSTAT) 0.4 MG SL tablet   Place 1 tablet (0.4 mg total) under the tongue every 5 (five) minutes as needed. For chest pain.    25 tablet    3    .   potassium chloride (KLOR-CON) 10 MEQ CR tablet   Take 10 mEq by mouth as needed. When taking lasix.              ROS:    General:  No weight loss, Fever, chills  HEENT: No recent headaches, no nasal bleeding, no visual changes, no sore throat  Neurologic: No dizziness, blackouts, seizures. No recent symptoms of stroke or mini- stroke. No recent episodes of slurred speech, or temporary blindness.  Cardiac: No recent episodes of chest pain/pressure, no shortness of breath at rest.  No shortness of breath with exertion.  Denies history of atrial fibrillation or irregular heartbeat  Vascular: No history of rest pain in feet.  No history of non-healing ulcer  Pulmonary: No home oxygen, no productive cough, no hemoptysis,  No asthma or wheezing  Musculoskeletal:  [ ]  Arthritis, [ ]  Low back pain,  [ ]  Joint pain  Hematologic:No history of hypercoagulable state.  No history of easy bleeding.  No history of anemia  Gastrointestinal: No hematochezia or  melena,  No gastroesophageal reflux  Urinary: [ ]  chronic Kidney disease, [ ]  on HD - [ ]  MWF or [ ]  TTHS, [ ]  Burning with urination, [ ]  Frequent urination, [ ]  Difficulty urinating;    Skin: No rashes  Psychological: +history of anxiety,  + history of depression   Physical Examination       Filed Vitals:     05/23/14 1520  05/23/14 1522   BP:  144/67  146/63   Pulse:  60  54   Resp:  16     Height:  5\' 3"  (1.6 m)     Weight:  106 lb (48.081 kg)      General:  Alert and oriented, no acute distress HEENT: Normal Neck: + left  bruit   Pulmonary: Clear to auscultation bilaterally Cardiac: Regular Rate and Rhythm without murmur Abdomen: Soft, non-tender, non-distended, no mass Skin: No rash, dependent rubor both feet Extremity Pulses:  2+ radial, brachial, femoral, absent dorsalis pedis, posterior tibial pulses bilaterally Musculoskeletal: No deformity or edema     Neurologic: Upper and lower extremity motor 5/5 and symmetric  DATA: She had bilateral ABIs and carotid  duplex exam today.  I reviewed and interpreted the studies. Carotid duplex scan shows 60-80% stenosis left side, 40-60 percent right side. ABI was 0.44 right 0.51 left compared to 0.73 on the left 0.55 on the right 2 years ago   ASSESSMENT:   #1 moderate  carotid stenosis asymptomatic continue antiplatelet therapy and risk factor modification #2 bilateral lower extremity claudication which has become progressive was slowly declining ABI. Unfortunately the patient continues to smoke and she was counseled against this for greater than 3 minutes again today.   PLAN:  Followup 6 months carotid duplex.  Aortogram bilateral lower extremity runoff possible intervention today. Risks benefits possible complications procedure details were explained the patient today including not limited to bleeding infection vessel injury contrast reaction. She insisted that she stop her Glucophage 3 days prior and 3 days after her  arteriogram.  Fabienne Bruns, MD [facility] Office: [phone] Pager: [phone]

## 2014-07-23 ENCOUNTER — Ambulatory Visit (INDEPENDENT_AMBULATORY_CARE_PROVIDER_SITE_OTHER): Payer: Medicare Other | Admitting: Cardiology

## 2014-07-23 ENCOUNTER — Encounter: Payer: Self-pay | Admitting: Cardiology

## 2014-07-23 VITALS — BP 114/58 | HR 52 | Ht 63.0 in | Wt 107.0 lb

## 2014-07-23 DIAGNOSIS — I6529 Occlusion and stenosis of unspecified carotid artery: Secondary | ICD-10-CM

## 2014-07-23 DIAGNOSIS — I2510 Atherosclerotic heart disease of native coronary artery without angina pectoris: Secondary | ICD-10-CM

## 2014-07-23 DIAGNOSIS — Z01810 Encounter for preprocedural cardiovascular examination: Secondary | ICD-10-CM

## 2014-07-23 DIAGNOSIS — E7800 Pure hypercholesterolemia, unspecified: Secondary | ICD-10-CM

## 2014-07-23 MED ORDER — AMLODIPINE BESYLATE 2.5 MG PO TABS: 2.5000 mg | ORAL_TABLET | Freq: Every day | ORAL | Status: DC

## 2014-07-23 NOTE — Patient Instructions (Addendum)
   Start Norvasc 2.5mg  daily. Sent to pharmacy Continue all other medications.   Your physician wants you to follow up in: 6 months.  You will receive a reminder letter in the mail one-two months in advance.  If you don't receive a letter, please call our office to schedule the follow up appointment

## 2014-07-23 NOTE — Progress Notes (Signed)
Clinical Summary Melissa Holland is a 68 y.o.female seen today for follow up of the following medical problems.   1. CAD  - cath 2008 with occluded RCA with good left to right collaterals, diffuse non-obstructive disease otherwise  - LVEF 55-60% by echo in 2012  - referred for cath 10/2013 in setting of chest pain not improved with intensified antianginal therapy and abnormal stress test, showed occlusion of OM at ostium and also occluded RCA (chronic RCA occlusion from prior films). Not amenable to PCI per discussions with cath physician, would need to be considered for CABG if clinically her pain could not be controlled.  - describes some chest pain at times with exertion, overall stable symptoms  2. HL  - compliant with statin  - no recent panel in our system, patient reports is followed by Dr [name]   3. PAD/Carotid disease  - recent lower extremity angio showing bilateral superficial femoral artery occlusions, bilateral common femoral artery stenosis 70%.  - she has been offered a right common femoral endarterectomy and right femoral to below knee pop bypass  4. Preoperative cardiac evaluation - has some minor chest pain at times. Mainly with moderate levels of activity. Never occurs at rest. Pain chronic for a year and a half.     Past Medical History  Diagnosis Date  . Diabetes mellitus   . Coronary artery disease     Severe single-vessel/100% occluded right with distal collateralization.Medical therapy/Normal LV fx  . Carotid bruit     Bilateral  . Sinus bradycardia   . Dyslipidemia   . DVT (deep venous thrombosis)     Remote portable as him   . COPD (chronic obstructive pulmonary disease)   . Arthritis   . Depression with anxiety   . Leg pain   . Productive cough   . Peripheral arterial disease   . Carotid artery occlusion      Allergies  Allergen Reactions  . Penicillins Anaphylaxis  . Sulfonamide Derivatives Anaphylaxis  . Alupent [Metaproterenol Sulfate]  Other (See Comments)    Makes pt "nervous"  . Azithromycin Other (See Comments)    unknown  . Linzess [Linaclotide] Nausea And Vomiting  . Omnipaque [Iohexol] Other (See Comments)    Unknown, patient denies allergy to contrast 03/06/14.  . Pantoprazole Nausea And Vomiting  . Paroxetine Other (See Comments)    unknown  . Varenicline Tartrate Other (See Comments)    unknown  . Benadryl [Diphenhydramine Hcl] Other (See Comments)    Can't breath  . Hydrocortisone Hives  . Prednisone Hives     Current Outpatient Prescriptions  Medication Sig Dispense Refill  . albuterol (2.5 MG/3ML) 0.083% NEBU 3 mL, albuterol (5 MG/ML) 0.5% NEBU 0.5 mL Inhale 2.5 mg into the lungs every 6 (six) hours as needed.       Marland Kitchen albuterol (VENTOLIN HFA) 108 (90 BASE) MCG/ACT inhaler Inhale 2 puffs into the lungs every 6 (six) hours as needed.      . ALPRAZolam (XANAX) 1 MG tablet Take 0.5-1 mg by mouth 4 (four) times daily as needed. For nerves      . aspirin 81 MG tablet Take 81 mg by mouth daily.        . feeding supplement (BOOST HIGH PROTEIN) LIQD Take 1 Container by mouth 2 (two) times daily.      . furosemide (LASIX) 20 MG tablet Take 20 mg by mouth daily as needed for fluid.       . metFORMIN (  GLUCOPHAGE) 500 MG tablet Take 500 mg by mouth daily.       . metoprolol succinate (TOPROL-XL) 25 MG 24 hr tablet Take 12.5 mg by mouth every morning.       . Multiple Vitamin (MULTIVITAMIN WITH MINERALS) TABS tablet Take 1 tablet by mouth daily.      . nitroGLYCERIN (NITROSTAT) 0.4 MG SL tablet Place 1 tablet (0.4 mg total) under the tongue every 5 (five) minutes as needed. For chest pain.  25 tablet  3  . oxyCODONE-acetaminophen (ENDOCET) 10-325 MG per tablet Take 0.5-1 tablets by mouth every 4 (four) hours as needed for pain.       . potassium chloride (KLOR-CON) 10 MEQ CR tablet Take 10 mEq by mouth as needed. When taking Furosemide      . simvastatin (ZOCOR) 40 MG tablet Take 1 tablet (40 mg total) by mouth every  evening.  90 tablet  3   No current facility-administered medications for this visit.     Past Surgical History  Procedure Laterality Date  . Parotidectomy      Left  . Shoulder blade      Cysts x 2  . Tonsillectomy and adenoidectomy    . Partial hysterectomy      Subsequent completion later that year,hx of large fibroid tumor  . Appendectomy    . Aortogram      with bilateral lower extremity run-off  . Abdominal hysterectomy    . Colonoscopy  12/31/2010    ZOX:WRUE tortuous, but otherwise normal-appearing colon/ Normal rectum  . Esophagogastroduodenoscopy (egd) with propofol N/A 05/16/2014    Procedure: ESOPHAGOGASTRODUODENOSCOPY (EGD) WITH PROPOFOL;  Surgeon: Corbin Ade, MD;  Location: [facility];  Service: Endoscopy;  Laterality: N/A;  . Esophageal dilation N/A 05/16/2014    Procedure: ESOPHAGEAL DILATION;  Surgeon: Corbin Ade, MD;  Location: [facility];  Service: Endoscopy;  Laterality: N/A;  #56 maloney  . Esophageal biopsy N/A 05/16/2014    Procedure: BIOPSY of stomach;  Surgeon: Corbin Ade, MD;  Location: [facility];  Service: Endoscopy;  Laterality: N/A;     Allergies  Allergen Reactions  . Penicillins Anaphylaxis  . Sulfonamide Derivatives Anaphylaxis  . Alupent [Metaproterenol Sulfate] Other (See Comments)    Makes pt "nervous"  . Azithromycin Other (See Comments)    unknown  . Linzess [Linaclotide] Nausea And Vomiting  . Omnipaque [Iohexol] Other (See Comments)    Unknown, patient denies allergy to contrast 03/06/14.  . Pantoprazole Nausea And Vomiting  . Paroxetine Other (See Comments)    unknown  . Varenicline Tartrate Other (See Comments)    unknown  . Benadryl [Diphenhydramine Hcl] Other (See Comments)    Can't breath  . Hydrocortisone Hives  . Prednisone Hives      Family History  Problem Relation Age of Onset  . Cancer Maternal Grandmother     liver  . Cancer Maternal Grandfather     lung  . Diabetes Mother   . Diabetes Son      Social  History Ms. Drumheller reports that she has been smoking Cigarettes.  She has a 52 pack-year smoking history. She has never used smokeless tobacco. Ms. Ackert reports that she does not drink alcohol.   Review of Systems CONSTITUTIONAL: No weight loss, fever, chills, weakness or fatigue.  HEENT: Eyes: No visual loss, blurred vision, double vision or yellow sclerae.No hearing loss, sneezing, congestion, runny nose or sore throat.  SKIN: No rash or itching.  CARDIOVASCULAR: per HPI RESPIRATORY: No  shortness of breath, cough or sputum.  GASTROINTESTINAL: No anorexia, nausea, vomiting or diarrhea. No abdominal pain or blood.  GENITOURINARY: No burning on urination, no polyuria NEUROLOGICAL: No headache, dizziness, syncope, paralysis, ataxia, numbness or tingling in the extremities. No change in bowel or bladder control.  MUSCULOSKELETAL: + leg pain  LYMPHATICS: No enlarged nodes. No history of splenectomy.  PSYCHIATRIC: No history of depression or anxiety.  ENDOCRINOLOGIC: No reports of sweating, cold or heat intolerance. No polyuria or polydipsia.  Marland Kitchen   Physical Examination p 52 bp 114/58 Wt 107 lbs BMI 19 Gen: resting comfortably, no acute distress HEENT: no scleral icterus, pupils equal round and reactive, no palptable cervical adenopathy,  CV: RRR, no m/r/g, no JVD, + bilateral carotid bruits Resp: Clear to auscultation bilaterally GI: abdomen is soft, non-tender, non-distended, normal bowel sounds, no hepatosplenomegaly MSK: extremities are warm, no edema.  Skin: warm, no rash Neuro:  no focal deficits Psych: appropriate affect   Diagnostic Studies 10/2011 Echo  LVEF 55-60%, no WMAs   10/2013 Echo  LVEF 50-55%, hypokinesis of the distalanteroseptal wall, akinesis basal mid-inferolateral walls. Grade I diastolic dysfunction.   09/2012 Carotid US:  IMPRESSION:  1. Heterogeneous calcified and noncalcified atherosclerotic plaque in the right carotid bulb and proximal right  internal carotid artery results in a 50 - 69% diameter stenosis. No significant interval change in the degree of stenosis compared to 10/07/2010 although subjectively there is increased calcification of the plaque.  2. Interval progression of heterogeneous predominately calcified atherosclerotic plaque in the left carotid bulb and proximal internal carotid artery resulting in a greater than 70% diameter stenosis.  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 Abnormal lexiscan MPI. There is a fixed apical defect consistent with prior infarction. There is an inferolateral wall defect with mild peri-infarct ischemia, the interpretation of this defect is limited by significant subdiaphragmatic attenuation adjacent to inferolateral wall. There is hypokinesis of this area, so likely there is a true prior infarct. Interpretation of peri-infarct ischemia of this territory may be limited by subdiaphragmatic attenuation. Correlate findings clinically.  2. Decrased left ventricular systolic function  11/08/13 Clinic EKG  - initial ekg wandering baseline, repeated. Repeat EKG shows sinus rhythm, RBBB, old TWI inferior leads.   10/2013 Cath  Left mainstem: Heavy calcification. No high grade disease  Left anterior descending (LAD): Long proximal 50%. I have compared this lesion to the 2008 films and it appears to be unchanged. The LAD is a very large vessel wrapping the apex. The mid LAD has 30% stenosis. There are diffuse luminal irregularities. This is a mid to distal moderate to large diagonal vessel that has mild luminal irregularities.  Left circumflex (LCx): The circumflex has had worsening stenosis with now occlusion at the ostium of a large branching OM. This OM has severe diffuse disease proximal to the bifurcation. The vessel after this is free of high grade disease.  Right coronary artery (RCA): Occluded in the mid  segment.  Left ventriculography: Left ventricular systolic function is mildly reduced with , LVEF is estimated at 50%, there is no significant mitral regurgitation  Final Conclusions: Two vessel occlusion with moderate disease in the proximal LAD.  Recommendations: The patient would need to be considered for CABG if she has continued chest pain. Discussed with Dr. Wyline Mood.          Assessment and Plan  1. CAD - stable symptoms, continue risk factor modification  and secondary prevention - add norvasc 2.5mg  daily for additional antianginal therapy  2. Hyperlipidemia - per pcp  3. Preoperative evaluation - patient is being considered for high risk vascular procedure. She has known CAD, last cath showed old CTO of RCA as well as disease of an OM. Her nuclear stress showed mild peri-infarct ischemia. Neither vessel is favorable for PCI, nor has PCI been indicated for her.  Overall neither her coronary anatomy or her stress test are high risk. Her LVEF is normal. She describes some stable symptoms of chest pain with moderate exertion. She is at increased, but not prohibitive risk for vascular surgery. She does not have an indication for coronary revascularization prior to surgery, nor has revascularization prior to noncardiac surgery been shown to effect outcomes. Would proceed with surgery as planned, continue current cardiac medications. Patient understands the increased risk from cardiac standpoint.    Dominga Ferry MD   F/u 6 months   Antoine Poche, M.D., F.A.C.C.

## 2014-07-25 ENCOUNTER — Ambulatory Visit: Payer: Medicare Other | Admitting: Vascular Surgery

## 2014-08-16 ENCOUNTER — Ambulatory Visit: Payer: Medicare Other | Admitting: Gastroenterology

## 2014-09-10 ENCOUNTER — Telehealth: Payer: Self-pay | Admitting: Vascular Surgery

## 2014-09-10 NOTE — Telephone Encounter (Signed)
Spoke with patient to schedule appt, dpm °

## 2014-09-10 NOTE — Telephone Encounter (Signed)
Message copied by Fredrich BirksMILLIKAN, DANA P on Tue Sep 10, 2014  2:12 PM ------      Message from: Phillips OdorPULLINS, CAROL S      Created: Mon Sep 09, 2014  4:55 PM      Regarding: schedule appt. with Dr. Darrick PennaFields       Contact: [phone]       Pt. is ready to schedule office visit with Dr. Darrick PennaFields to discuss bypass  surgery.  She already has MD appt. 11/11 and 11/12, so not able to schedule on those dates. ------

## 2014-09-17 ENCOUNTER — Encounter: Payer: Self-pay | Admitting: Vascular Surgery

## 2014-09-18 ENCOUNTER — Ambulatory Visit (INDEPENDENT_AMBULATORY_CARE_PROVIDER_SITE_OTHER): Payer: Medicare Other | Admitting: Vascular Surgery

## 2014-09-18 ENCOUNTER — Encounter: Payer: Self-pay | Admitting: Vascular Surgery

## 2014-09-18 VITALS — Ht 63.0 in | Wt 109.6 lb

## 2014-09-18 DIAGNOSIS — I739 Peripheral vascular disease, unspecified: Secondary | ICD-10-CM

## 2014-09-18 DIAGNOSIS — I6529 Occlusion and stenosis of unspecified carotid artery: Secondary | ICD-10-CM

## 2014-09-18 NOTE — Progress Notes (Signed)
[facility] [place] HISTORY AND PHYSICAL    History of Present Illness:  Patient is a 68 y.o. year old female who presents for evaluation of peripheral arterial disease and asymptomatic moderate carotid stenosis.   She denies any symptoms of TIA amaurosis or stroke.  She continues to complain of pain in her lower extremities. She states that the left leg is the worst. She develops cramping and tightness in her leg after walking. She denies rest pain. She has a new ulcer on her left third toe after scuffing her foot recently. This is slowly healing.. She is a very difficult historian and tends to jump from place to place and is hard to keep on task.  She continues to smoke. Greater than 3 minutes they were spent regarding smoking cessation counseling. Other medical problems include diabetes, coronary disease, depression and anxiety. These are currently controlled and followed primarily by Dr. [name] and Dr. Antoine Poche.  She had an arteriogram in August of 2015. This showed bilateral high grade common femoral stenoses as well as bilateral superficial femoral artery occlusions. She was offered femoral popliteal bypass at that time but refused. She has previously had a left leg vein stripping.    Past Medical History    Diagnosis   Date    .   Diabetes mellitus       .   Coronary artery disease             Severe single-vessel/100% occluded right with distal collateralization.Medical therapy/Normal LV fx    .   Carotid bruit             Bilateral    .   Sinus bradycardia       .   Dyslipidemia       .   DVT (deep venous thrombosis)             Remote portable as him     .   COPD (chronic obstructive pulmonary disease)       .   Arthritis       .   Depression with anxiety       .   Leg pain       .   Productive cough       .   Peripheral arterial disease       .   Carotid artery occlusion           Past Surgical History    Procedure   Date    .   Parotidectomy         Left    .   Shoulder blade             Cysts x 2    .   Tonsillectomy and adenoidectomy       .   Partial hysterectomy             Subsequent completion later that year,hx of large fibroid tumor    .   Appendectomy       .   Aortogram             with bilateral lower extremity run-off    .   Abdominal hysterectomy          Social History History    Substance Use Topics    .   Smoking status:   Current Everyday Smoker -- 1.0 packs/day for 52 years          Types:  Cigarettes    .   Smokeless tobacco:   Never Used       Comment: pt states she will be starting patches today    .   Alcohol Use:   No      Family History Family History    Problem   Relation   Age of Onset    .   Cancer   Maternal Grandmother             liver    .   Cancer   Maternal Grandfather             lung    .   Diabetes   Mother       .   Diabetes   Son         Allergies    Allergies    Allergen   Reactions    .   Penicillins   Anaphylaxis    .   Sulfonamide Derivatives   Anaphylaxis    .   Alupent (Metaproterenol Sulfate)   Other (See Comments)          Makes pt "nervous"    .   Azithromycin   Other (See Comments)          unknown    .   Omnipaque (Iohexol)   Other (See Comments)          unknown    .   Paroxetine   Other (See Comments)          unknown    .   Varenicline Tartrate   Other (See Comments)          unknown    .   Benadryl (Diphenhydramine Hcl)   Other (See Comments)          Can't breath    .   Hydrocortisone   Hives    .   Prednisone   Hives         Current Outpatient Prescriptions    Medication   Sig   Dispense   Refill    .   albuterol (2.5 MG/3ML) 0.083% NEBU 3 mL, albuterol (5 MG/ML) 0.5% NEBU 0.5 mL   Inhale 2.5 mg into the lungs every 6 (six) hours.            Marland Kitchen.   albuterol (VENTOLIN HFA) 108 (90 BASE) MCG/ACT inhaler   Inhale 2 puffs into the lungs every 6 (six) hours as needed.            .   ALPRAZolam (XANAX) 1 MG tablet   Take 1 mg by mouth 4 (four) times  daily as needed. For nerves            .   amLODipine (NORVASC) 5 MG tablet   Take 1 tablet (5 mg total) by mouth daily.    90 tablet    3    .   aspirin 81 MG tablet   Take 81 mg by mouth daily.              Marland Kitchen.   atorvastatin (LIPITOR) 40 MG tablet   Take 1 tablet (40 mg total) by mouth every evening.    90 tablet    3    .   bisacodyl (DULCOLAX) 5 MG EC tablet   Take 20 mg by mouth daily as needed. For bowel movement.            .   furosemide (LASIX)  20 MG tablet   Take 20 mg by mouth as needed. For swelling.            Marland Kitchen   HYDROcodone-acetaminophen (NORCO) 7.5-325 MG per tablet   Take 1 tablet by mouth every 6 (six) hours as needed. For pain.            .   isosorbide mononitrate (IMDUR) 60 MG 24 hr tablet   Take 1 tablet (60 mg total) by mouth every morning.    90 tablet    2    .   lactose free nutrition (BOOST) LIQD   Take 1 Container by mouth daily. Calorie Smart            .   metFORMIN (GLUCOPHAGE) 500 MG tablet   Take 1,000 mg by mouth 2 (two) times daily with a meal.              .   metoprolol succinate (TOPROL-XL) 25 MG 24 hr tablet   Take 12.5 mg by mouth every morning.             .   nitroGLYCERIN (NITROSTAT) 0.4 MG SL tablet   Place 1 tablet (0.4 mg total) under the tongue every 5 (five) minutes as needed. For chest pain.    25 tablet    3    .   potassium chloride (KLOR-CON) 10 MEQ CR tablet   Take 10 mEq by mouth as needed. When taking lasix.              ROS:    General:  No weight loss, Fever, chills  HEENT: No recent headaches, no nasal bleeding, no visual changes, no sore throat  Neurologic: No dizziness, blackouts, seizures. No recent symptoms of stroke or mini- stroke. No recent episodes of slurred speech, or temporary blindness.  Cardiac: No recent episodes of chest pain/pressure, no shortness of breath at rest.  No shortness of breath with exertion.  Denies history of atrial fibrillation or irregular heartbeat  Vascular: No history of rest pain in feet.  No history  of non-healing ulcer  Pulmonary: No home oxygen, no productive cough, no hemoptysis,  No asthma or wheezing  Musculoskeletal:  [ ]  Arthritis, [ ]  Low back pain,  [ ]  Joint pain  Hematologic:No history of hypercoagulable state.  No history of easy bleeding.  No history of anemia  Gastrointestinal: No hematochezia or melena,  No gastroesophageal reflux  Urinary: [ ]  chronic Kidney disease, [ ]  on HD - [ ]  MWF or [ ]  TTHS, [ ]  Burning with urination, [ ]  Frequent urination, [ ]  Difficulty urinating;    Skin: No rashes  Psychological: +history of anxiety,  + history of depression   Physical Examination       Filed Vitals:   09/18/14 1533  Height: 5\' 3"  (1.6 m)  Weight: 109 lb 9.6 oz (49.714 kg)    General:  Alert and oriented, no acute distress HEENT: Normal Neck: + left  bruit   Pulmonary: Clear to auscultation bilaterally Cardiac: Regular Rate and Rhythm without murmur Abdomen: Soft, non-tender, non-distended, no mass Skin: No rash, dependent rubor both feet, 2 mm ulceration left third toe less than 1 mm depth Extremity Pulses:  2+ radial, brachial, femoral, absent dorsalis pedis, posterior tibial pulses bilaterally Musculoskeletal: No deformity or edema     Neurologic: Upper and lower extremity motor 5/5 and symmetric  DATA: She had bilateral ABIs and carotid  duplex previously. Carotid duplex scan shows  60-80% stenosis left side, 40-60 percent right side. ABI was 0.44 right 0.51 left compared to 0.73 on the left 0.55 on the right 2 years ago   ASSESSMENT:   #1 moderate carotid stenosis asymptomatic continue antiplatelet therapy and risk factor modification needs repeat carotid duplex in 6 months #2 bilateral lower extremity claudication which has become progressive was slowly declining ABI. Unfortunately the patient continues to smoke and she was counseled against this for greater than 3 minutes again today. She is unable to make a decision on whether or not she wishes to  proceed with bypass. She will followup with me again in one month. She will followup sooner she wishes to proceed with bypass. However she would need cardiac risk stratification prior to a bypass procedure.  Risk of limb loss was discussed with the patient today. I discussed with her that if she continues to smoke she will most likely end up with amputation of her legs at some point future. I also discussed with her that a nonhealing wound on her left foot puts her at risk of limb loss.  Fabienne Brunsharles Fields, MD [facility] [place] Office: [phone] Pager: [phone]

## 2014-10-10 ENCOUNTER — Encounter: Payer: Self-pay | Admitting: *Deleted

## 2014-10-10 ENCOUNTER — Ambulatory Visit (INDEPENDENT_AMBULATORY_CARE_PROVIDER_SITE_OTHER): Payer: Medicare Other | Admitting: Cardiology

## 2014-10-10 ENCOUNTER — Encounter: Payer: Self-pay | Admitting: Cardiology

## 2014-10-10 VITALS — BP 114/64 | HR 55 | Ht 63.0 in | Wt 106.0 lb

## 2014-10-10 DIAGNOSIS — I2510 Atherosclerotic heart disease of native coronary artery without angina pectoris: Secondary | ICD-10-CM

## 2014-10-10 DIAGNOSIS — R072 Precordial pain: Secondary | ICD-10-CM

## 2014-10-10 DIAGNOSIS — I6529 Occlusion and stenosis of unspecified carotid artery: Secondary | ICD-10-CM

## 2014-10-10 MED ORDER — AMLODIPINE BESYLATE 2.5 MG PO TABS: 2.5000 mg | ORAL_TABLET | Freq: Every day | ORAL | Status: DC

## 2014-10-10 NOTE — Progress Notes (Signed)
Clinical Summary [name] is a 68 y.o.female seen today for follow up of the following medical problems.This is a focused visit on he history of CAD and chest pain.    1. CAD  - cath 2008 with occluded RCA with good left to right collaterals, diffuse non-obstructive disease otherwise  - LVEF 55-60% by echo in 2012  - referred for cath 10/2013 in setting of chest pain not improved with intensified antianginal therapy and abnormal stress test, showed occlusion of OM at ostium and also occluded RCA (chronic RCA occlusion from prior films). Not amenable to PCI per discussions with cath physician  - she has a long history of intermittent chest pain often atypical in character. - reports recent epidoses of chest pain. Tightness left chest that can radiate to left jaw, can cause headaches. Varying in severity, can occur at rest or with exertion. Often occurs with activites like vaccuming, causes her to stop and rest. Feels lightheaded. Taking deep breaths makes it worst. Can have pain into back and shoulder blades. Can't put weight on left arm when pain is active because it makes it worst. Has tried NG, with some help. Often can be brought on with anxiety.      Past Medical History  Diagnosis Date  . Diabetes mellitus   . Coronary artery disease     Severe single-vessel/100% occluded right with distal collateralization.Medical therapy/Normal LV fx  . Carotid bruit     Bilateral  . Sinus bradycardia   . Dyslipidemia   . DVT (deep venous thrombosis)     Remote portable as him   . COPD (chronic obstructive pulmonary disease)   . Arthritis   . Depression with anxiety   . Leg pain   . Productive cough   . Peripheral arterial disease   . Carotid artery occlusion      Allergies  Allergen Reactions  . Penicillins Anaphylaxis  . Sulfonamide Derivatives Anaphylaxis  . Alupent [Metaproterenol Sulfate] Other (See Comments)    Makes pt "nervous"  . Azithromycin Other (See Comments)     unknown  . Linzess [Linaclotide] Nausea And Vomiting  . Omnipaque [Iohexol] Other (See Comments)    Unknown, patient denies allergy to contrast 03/06/14.  . Pantoprazole Nausea And Vomiting  . Paroxetine Other (See Comments)    unknown  . Varenicline Tartrate Other (See Comments)    unknown  . Benadryl [Diphenhydramine Hcl] Other (See Comments)    Can't breath  . Hydrocortisone Hives  . Prednisone Hives     Current Outpatient Prescriptions  Medication Sig Dispense Refill  . albuterol (2.5 MG/3ML) 0.083% NEBU 3 mL, albuterol (5 MG/ML) 0.5% NEBU 0.5 mL Inhale 2.5 mg into the lungs every 6 (six) hours as needed.     Marland Kitchen. albuterol (VENTOLIN HFA) 108 (90 BASE) MCG/ACT inhaler Inhale 2 puffs into the lungs every 6 (six) hours as needed.    . ALPRAZolam (XANAX) 1 MG tablet Take 0.5-1 mg by mouth 4 (four) times daily as needed. For nerves    . amLODipine (NORVASC) 2.5 MG tablet Take 1 tablet (2.5 mg total) by mouth daily. 30 tablet 6  . aspirin 81 MG tablet Take 81 mg by mouth daily.      . feeding supplement (BOOST HIGH PROTEIN) LIQD Take 1 Container by mouth 2 (two) times daily.    . furosemide (LASIX) 20 MG tablet Take 20 mg by mouth daily as needed for fluid.     . metFORMIN (GLUCOPHAGE) 500 MG  tablet Take 500 mg by mouth daily.     . metoprolol succinate (TOPROL-XL) 25 MG 24 hr tablet Take 12.5 mg by mouth every morning.     . Multiple Vitamin (MULTIVITAMIN WITH MINERALS) TABS tablet Take 1 tablet by mouth daily.    . nitroGLYCERIN (NITROSTAT) 0.4 MG SL tablet Place 1 tablet (0.4 mg total) under the tongue every 5 (five) minutes as needed. For chest pain. 25 tablet 3  . oxyCODONE-acetaminophen (ENDOCET) 10-325 MG per tablet Take 0.5-1 tablets by mouth every 4 (four) hours as needed for pain.     . potassium chloride (KLOR-CON) 10 MEQ CR tablet Take 10 mEq by mouth as needed. When taking Furosemide    . simvastatin (ZOCOR) 40 MG tablet Take 1 tablet (40 mg total) by mouth every evening. 90  tablet 3   No current facility-administered medications for this visit.     Past Surgical History  Procedure Laterality Date  . Parotidectomy      Left  . Shoulder blade      Cysts x 2  . Tonsillectomy and adenoidectomy    . Partial hysterectomy      Subsequent completion later that year,hx of large fibroid tumor  . Appendectomy    . Aortogram      with bilateral lower extremity run-off  . Abdominal hysterectomy    . Colonoscopy  12/31/2010    ZOX:WRUERMR:Long tortuous, but otherwise normal-appearing colon/ Normal rectum  . Esophagogastroduodenoscopy (egd) with propofol N/A 05/16/2014    AVW:[id number]:Esophageal inlet patch otherwise normal/s/p dilation as described above. Antral erosions and a single gastric ulcer s/p gastric bx  . Esophageal dilation N/A 05/16/2014    Procedure: ESOPHAGEAL DILATION;  Surgeon: Corbin Adeobert M Rourk, MD;  Location: [facility];  Service: Endoscopy;  Laterality: N/A;  #56 maloney  . Esophageal biopsy N/A 05/16/2014    Procedure: BIOPSY of stomach;  Surgeon: Corbin Adeobert M Rourk, MD;  Location: [facility];  Service: Endoscopy;  Laterality: N/A;     Allergies  Allergen Reactions  . Penicillins Anaphylaxis  . Sulfonamide Derivatives Anaphylaxis  . Alupent [Metaproterenol Sulfate] Other (See Comments)    Makes pt "nervous"  . Azithromycin Other (See Comments)    unknown  . Linzess [Linaclotide] Nausea And Vomiting  . Omnipaque [Iohexol] Other (See Comments)    Unknown, patient denies allergy to contrast 03/06/14.  . Pantoprazole Nausea And Vomiting  . Paroxetine Other (See Comments)    unknown  . Varenicline Tartrate Other (See Comments)    unknown  . Benadryl [Diphenhydramine Hcl] Other (See Comments)    Can't breath  . Hydrocortisone Hives  . Prednisone Hives      Family History  Problem Relation Age of Onset  . Cancer Maternal Grandmother     liver  . Cancer Maternal Grandfather     lung  . Diabetes Mother   . Diabetes Son      Social History Ms. Laroque reports  that she has been smoking Cigarettes.  She has a 52 pack-year smoking history. She has never used smokeless tobacco. [name] reports that she does not drink alcohol.   Review of Systems CONSTITUTIONAL: No weight loss, fever, chills, weakness or fatigue.  HEENT: Eyes: No visual loss, blurred vision, double vision or yellow sclerae.No hearing loss, sneezing, congestion, runny nose or sore throat.  SKIN: No rash or itching.  CARDIOVASCULAR: per HPI RESPIRATORY: No shortness of breath, cough or sputum.  GASTROINTESTINAL: No anorexia, nausea, vomiting or diarrhea. No abdominal pain or blood.  GENITOURINARY: No burning on urination, no polyuria NEUROLOGICAL: No headache, dizziness, syncope, paralysis, ataxia, numbness or tingling in the extremities. No change in bowel or bladder control.  MUSCULOSKELETAL: No muscle, back pain, joint pain or stiffness.  LYMPHATICS: No enlarged nodes. No history of splenectomy.  PSYCHIATRIC: No history of depression or anxiety.  ENDOCRINOLOGIC: No reports of sweating, cold or heat intolerance. No polyuria or polydipsia.  Marland Kitchen   Physical Examination p 55 bp 114/64 Wt 106 lbs BMI 19 Gen: resting comfortably, no acute distress HEENT: no scleral icterus, pupils equal round and reactive, no palptable cervical adenopathy,  CV: RRR, no m/r/g, no JVD, no carotid bruits Resp: Clear to auscultation bilaterally GI: abdomen is soft, non-tender, non-distended, normal bowel sounds, no hepatosplenomegaly MSK: extremities are warm, no edema.  Skin: warm, no rash Neuro:  no focal deficits Psych: appropriate affect   Diagnostic Studies 10/2011 Echo  LVEF 55-60%, no WMAs   10/2013 Echo  LVEF 50-55%, hypokinesis of the distalanteroseptal wall, akinesis basal mid-inferolateral walls. Grade I diastolic dysfunction.   09/2012 Carotid US:  IMPRESSION:  1. Heterogeneous calcified and noncalcified atherosclerotic plaque in the right carotid bulb and proximal right  internal carotid artery results in a 50 - 69% diameter stenosis. No significant interval change in the degree of stenosis compared to 10/07/2010 although subjectively there is increased calcification of the plaque.  2. Interval progression of heterogeneous predominately calcified atherosclerotic plaque in the left carotid bulb and proximal internal carotid artery resulting in a greater than 70% diameter stenosis.  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 Abnormal lexiscan MPI. There is a fixed apical defect consistent with prior infarction. There is an inferolateral wall defect with mild peri-infarct ischemia, the interpretation of this defect is limited by significant subdiaphragmatic attenuation adjacent to inferolateral wall. There is hypokinesis of this area, so likely there is a true prior infarct. Interpretation of peri-infarct ischemia of this territory may be limited by subdiaphragmatic attenuation. Correlate findings clinically.  2. Decrased left ventricular systolic function  11/08/13 Clinic EKG  - initial ekg wandering baseline, repeated. Repeat EKG shows sinus rhythm, RBBB, old TWI inferior leads.   10/2013 Cath  Left mainstem: Heavy calcification. No high grade disease  Left anterior descending (LAD): Long proximal 50%. I have compared this lesion to the 2008 films and it appears to be unchanged. The LAD is a very large vessel wrapping the apex. The mid LAD has 30% stenosis. There are diffuse luminal irregularities. This is a mid to distal moderate to large diagonal vessel that has mild luminal irregularities.  Left circumflex (LCx): The circumflex has had worsening stenosis with now occlusion at the ostium of a large branching OM. This OM has severe diffuse disease proximal to the bifurcation. The vessel after this is free of high grade disease.  Right coronary artery (RCA): Occluded in  the mid segment.  Left ventriculography: Left ventricular systolic function is mildly reduced with , LVEF is estimated at 50%, there is no significant mitral regurgitation  Final Conclusions: Two vessel occlusion with moderate disease in the proximal LAD.  Recommendations: The patient would need to be considered for CABG if she has continued chest pain. Discussed with Dr. Wyline Mood.      Assessment and Plan  1. CAD - long history of intermittent mixed chest pain. She has known CAD with a long history of RCA CTO, last cath 10/2013 also showed OM disease that  was not amenable to PCI. She has had mixed compliance with her antianginals, and has stopped taking her norvasc at home she had also prevoiusly stopped her imdur in the past as well. Beta blocker therapy has been limited due to low resting HR's. - restart norvasc as additional antianginal, obtain Lexiscan MPI to further risk stratify, she did have a 50% LAD on last cath that potentially may have progressed.    F/u 1 month     Antoine Poche, M.D.

## 2014-10-10 NOTE — Patient Instructions (Signed)
Your physician has requested that you have a lexiscan myoview. For further information please visit [email or web address]. Please follow instruction sheet, as given. Office will contact with results via phone or letter.   Begin Norvasc 2.5mg  daily - new sent to pharm  Continue all other medications.   Follow up in  4 weeks

## 2014-10-15 ENCOUNTER — Telehealth: Payer: Self-pay | Admitting: Vascular Surgery

## 2014-10-15 NOTE — Telephone Encounter (Signed)
Patient called to cancel her appointment with Dr Darrick PennaFields and her stress test. She stated that she has mold in her house, (which she thinks is contributing to her health issues) and is moving the week of the Thanksgiving. She is overwhelmed with everything and just can't think about this right now.   [facility] is [facility]. She will try to get everything rescheduled with their assistance. She feels that they will care about her more than her family. She was very teary about her situation.  I will route this to Charlsie MerlesStephanie DeHart, RN and Dr Darrick PennaFields as Lorain ChildesFYI

## 2014-10-17 ENCOUNTER — Ambulatory Visit: Payer: Medicare Other | Admitting: Vascular Surgery

## 2014-10-18 ENCOUNTER — Inpatient Hospital Stay (HOSPITAL_COMMUNITY): Admission: RE | Admit: 2014-10-18 | Payer: Medicare Other | Source: Ambulatory Visit

## 2014-10-18 ENCOUNTER — Encounter (HOSPITAL_COMMUNITY): Payer: Medicare Other

## 2014-10-31 ENCOUNTER — Ambulatory Visit: Payer: Medicare Other | Admitting: Vascular Surgery

## 2014-11-06 ENCOUNTER — Encounter (HOSPITAL_COMMUNITY): Payer: Self-pay | Admitting: Vascular Surgery

## 2014-11-07 ENCOUNTER — Encounter (HOSPITAL_COMMUNITY): Payer: Self-pay | Admitting: Cardiology

## 2014-11-08 ENCOUNTER — Telehealth: Payer: Self-pay | Admitting: Cardiology

## 2014-11-08 NOTE — Telephone Encounter (Signed)
Patient notified that she only needs to hold the Metformin morning of her test due to not eating 4 hours prior to test.  Not because of any interaction with her kidneys.  Patient verbalized understanding.

## 2014-11-11 ENCOUNTER — Other Ambulatory Visit (HOSPITAL_COMMUNITY): Payer: Medicare Other

## 2014-11-13 ENCOUNTER — Encounter (HOSPITAL_COMMUNITY)
Admission: RE | Admit: 2014-11-13 | Discharge: 2014-11-13 | Disposition: A | Discharge status: Home / Self Care | Payer: Medicare Other | Source: Ambulatory Visit | Attending: Cardiology | Admitting: Cardiology

## 2014-11-13 ENCOUNTER — Ambulatory Visit: Payer: Medicare Other | Admitting: Cardiology

## 2014-11-13 ENCOUNTER — Encounter (HOSPITAL_COMMUNITY): Payer: Self-pay

## 2014-11-13 ENCOUNTER — Ambulatory Visit (HOSPITAL_COMMUNITY)
Admission: RE | Admit: 2014-11-13 | Discharge: 2014-11-13 | Disposition: A | Discharge status: Home / Self Care | Payer: Medicare Other | Source: Ambulatory Visit | Attending: Cardiology | Admitting: Cardiology

## 2014-11-13 DIAGNOSIS — R079 Chest pain, unspecified: Secondary | ICD-10-CM | POA: Insufficient documentation

## 2014-11-13 DIAGNOSIS — R0602 Shortness of breath: Secondary | ICD-10-CM | POA: Insufficient documentation

## 2014-11-13 DIAGNOSIS — R072 Precordial pain: Secondary | ICD-10-CM | POA: Diagnosis present

## 2014-11-13 DIAGNOSIS — M549 Dorsalgia, unspecified: Secondary | ICD-10-CM | POA: Insufficient documentation

## 2014-11-13 DIAGNOSIS — I2510 Atherosclerotic heart disease of native coronary artery without angina pectoris: Secondary | ICD-10-CM | POA: Insufficient documentation

## 2014-11-13 MED ORDER — SODIUM CHLORIDE 0.9 % IJ SOLN
10.0000 mL | INTRAMUSCULAR | Status: DC | PRN
  Administered 2014-11-13: 10 mL via INTRAVENOUS
  Filled 2014-11-13: qty 10

## 2014-11-13 MED ORDER — REGADENOSON 0.4 MG/5ML IV SOLN
INTRAVENOUS | Status: AC
  Administered 2014-11-13: 0.4 mg via INTRAVENOUS
  Filled 2014-11-13: qty 5

## 2014-11-13 MED ORDER — TECHNETIUM TC 99M SESTAMIBI - CARDIOLITE
30.0000 | Freq: Once | INTRAVENOUS | Status: AC | PRN
Start: 2014-11-13 — End: 2014-11-13
  Administered 2014-11-13: 10:00:00 30 via INTRAVENOUS

## 2014-11-13 MED ORDER — TECHNETIUM TC 99M SESTAMIBI GENERIC - CARDIOLITE
10.0000 | Freq: Once | INTRAVENOUS | Status: AC | PRN
  Administered 2014-11-13: 10 via INTRAVENOUS

## 2014-11-13 MED ORDER — SODIUM CHLORIDE 0.9 % IJ SOLN
INTRAMUSCULAR | Status: AC
  Administered 2014-11-13: 10 mL via INTRAVENOUS
  Filled 2014-11-13: qty 10

## 2014-11-13 MED ORDER — REGADENOSON 0.4 MG/5ML IV SOLN
0.4000 mg | Freq: Once | INTRAVENOUS | Status: AC | PRN
  Administered 2014-11-13: 0.4 mg via INTRAVENOUS

## 2014-11-13 NOTE — Progress Notes (Signed)
Stress Lab Nurses Notes - [facility]  Romelle StarcherJacqueline P Scarber 11/13/2014 Reason for doing test: CAD and Chest Pain Type of test: Marlane HatcherLexiscan Cardiolite Nurse performing test: Parke PoissonPhyllis Billingsly, RN Nuclear Medicine Tech: Marcella DubsMiranda Womack Echo Tech: Not Applicable MD performing test: Koneswaran/M.Geni BersLenze PA Family MD: Juanetta GoslingHawkins Test explained and consent signed: Yes.   IV started: Saline lock flushed, No redness or edema and Saline lock started in radiology Symptoms: SOB & discomfort in back Treatment/Intervention: None Reason test stopped: protocol completed After recovery IV was: Discontinued via X-ray tech and No redness or edema Patient to return to Nuc. Med at : 10:30 Patient discharged: Home Patient's Condition upon discharge was: stable Comments: During test BP 111/53 & HR 76.  Recovery BP 132/70 & HR 67.  Symptoms resolved in recovery.  Erskine SpeedBillingsley, Lindsay Straka T

## 2014-11-14 ENCOUNTER — Encounter: Payer: Self-pay | Admitting: Cardiology

## 2014-11-14 ENCOUNTER — Ambulatory Visit (INDEPENDENT_AMBULATORY_CARE_PROVIDER_SITE_OTHER): Payer: Medicare Other | Admitting: Cardiology

## 2014-11-14 VITALS — BP 126/67 | HR 61 | Ht 63.0 in | Wt 110.0 lb

## 2014-11-14 DIAGNOSIS — I6529 Occlusion and stenosis of unspecified carotid artery: Secondary | ICD-10-CM

## 2014-11-14 DIAGNOSIS — I2510 Atherosclerotic heart disease of native coronary artery without angina pectoris: Secondary | ICD-10-CM

## 2014-11-14 MED ORDER — AMLODIPINE BESYLATE 5 MG PO TABS: 5.0000 mg | ORAL_TABLET | Freq: Every day | ORAL | Status: DC

## 2014-11-14 NOTE — Progress Notes (Signed)
Clinical Summary [name] is a 67 y.o.female seen today for follow up of the following medical problems. This is a focused visit on her history of CAD and chest pain.    1. CAD  - cath 2008 with occluded RCA with good left to right collaterals, diffuse non-obstructive disease otherwise  - LVEF 55-60% by echo in 2012  - referred for cath 10/2013 in setting of chest pain not improved with intensified antianginal therapy and abnormal stress test, showed occlusion of OM at ostium and also occluded RCA (chronic RCA occlusion from prior films). Not amenable to PCI per discussions with cath physician  - she has a long history of intermittent chest pain often atypical in character. - reports recent epidoses of chest pain. Tightness left chest that can radiate to left jaw, can cause headaches. Varying in severity, can occur at rest or with exertion. Often occurs with activites like vaccuming, causes her to stop and rest. -Taking deep breaths makes it worst. Can have pain into back and shoulder blades. Can't put weight on left arm when pain is active because it makes it worst. Has tried NG, with some help.  - since last visit completed Lexiscan MPI which showed large anterolateral ischemia      Past Medical History  Diagnosis Date  . Diabetes mellitus   . Coronary artery disease     Severe single-vessel/100% occluded right with distal collateralization.Medical therapy/Normal LV fx  . Carotid bruit     Bilateral  . Sinus bradycardia   . Dyslipidemia   . DVT (deep venous thrombosis)     Remote portable as him   . COPD (chronic obstructive pulmonary disease)   . Arthritis   . Depression with anxiety   . Leg pain   . Productive cough   . Peripheral arterial disease   . Carotid artery occlusion      Allergies  Allergen Reactions  . Penicillins Anaphylaxis  . Sulfonamide Derivatives Anaphylaxis  . Alupent [Metaproterenol Sulfate] Other (See Comments)    Makes pt "nervous"  .  Azithromycin Other (See Comments)    unknown  . Linzess [Linaclotide] Nausea And Vomiting  . Omnipaque [Iohexol] Other (See Comments)    Unknown, patient denies allergy to contrast 03/06/14.  . Pantoprazole Nausea And Vomiting  . Paroxetine Other (See Comments)    unknown  . Varenicline Tartrate Other (See Comments)    unknown  . Benadryl [Diphenhydramine Hcl] Other (See Comments)    Can't breath  . Hydrocortisone Hives  . Prednisone Hives     Current Outpatient Prescriptions  Medication Sig Dispense Refill  . albuterol (2.5 MG/3ML) 0.083% NEBU 3 mL, albuterol (5 MG/ML) 0.5% NEBU 0.5 mL Inhale 2.5 mg into the lungs every 6 (six) hours as needed.     Marland Kitchen albuterol (VENTOLIN HFA) 108 (90 BASE) MCG/ACT inhaler Inhale 2 puffs into the lungs every 6 (six) hours as needed.    . ALPRAZolam (XANAX) 1 MG tablet Take 0.5-1 mg by mouth 4 (four) times daily as needed. For nerves    . amLODipine (NORVASC) 2.5 MG tablet Take 1 tablet (2.5 mg total) by mouth daily. 30 tablet 6  . aspirin 81 MG tablet Take 81 mg by mouth daily.      . feeding supplement (BOOST HIGH PROTEIN) LIQD Take 1 Container by mouth 2 (two) times daily.    . furosemide (LASIX) 20 MG tablet Take 20 mg by mouth daily as needed for fluid.     Marland Kitchen  metFORMIN (GLUCOPHAGE) 500 MG tablet Take 500 mg by mouth daily.     . metoprolol succinate (TOPROL-XL) 25 MG 24 hr tablet Take 12.5 mg by mouth every morning.     . Multiple Vitamin (MULTIVITAMIN WITH MINERALS) TABS tablet Take 1 tablet by mouth daily.    . nitroGLYCERIN (NITROSTAT) 0.4 MG SL tablet Place 1 tablet (0.4 mg total) under the tongue every 5 (five) minutes as needed. For chest pain. 25 tablet 3  . oxyCODONE-acetaminophen (ENDOCET) 10-325 MG per tablet Take 0.5-1 tablets by mouth every 4 (four) hours as needed for pain.     . potassium chloride (KLOR-CON) 10 MEQ CR tablet Take 10 mEq by mouth as needed. When taking Furosemide    . simvastatin (ZOCOR) 40 MG tablet Take 1 tablet (40 mg  total) by mouth every evening. 90 tablet 3   No current facility-administered medications for this visit.     Past Surgical History  Procedure Laterality Date  . Parotidectomy      Left  . Shoulder blade      Cysts x 2  . Tonsillectomy and adenoidectomy    . Partial hysterectomy      Subsequent completion later that year,hx of large fibroid tumor  . Appendectomy    . Aortogram      with bilateral lower extremity run-off  . Abdominal hysterectomy    . Colonoscopy  12/31/2010    RUE:AVWU tortuous, but otherwise normal-appearing colon/ Normal rectum  . Esophagogastroduodenoscopy (egd) with propofol N/A 05/16/2014    JWJ:[id number] inlet patch otherwise normal/s/p dilation as described above. Antral erosions and a single gastric ulcer s/p gastric bx  . Esophageal dilation N/A 05/16/2014    Procedure: ESOPHAGEAL DILATION;  Surgeon: Corbin Ade, MD;  Location: [facility];  Service: Endoscopy;  Laterality: N/A;  #56 maloney  . Esophageal biopsy N/A 05/16/2014    Procedure: BIOPSY of stomach;  Surgeon: Corbin Ade, MD;  Location: [facility];  Service: Endoscopy;  Laterality: N/A;  . Abdominal angiogram N/A 11/12/2011    Procedure: ABDOMINAL ANGIOGRAM;  Surgeon: Sherren Kerns, MD;  Location: [facility] CATH LAB;  Service: Cardiovascular;  Laterality: N/A;  . Visceral angiogram  11/12/2011    Procedure: VISCERAL ANGIOGRAM;  Surgeon: Sherren Kerns, MD;  Location: [facility] CATH LAB;  Service: Cardiovascular;;  . Lower extremity angiogram Bilateral 11/12/2011    Procedure: LOWER EXTREMITY ANGIOGRAM;  Surgeon: Sherren Kerns, MD;  Location: [facility] CATH LAB;  Service: Cardiovascular;  Laterality: Bilateral;  . Left heart catheterization with coronary angiogram N/A 11/13/2013    Procedure: LEFT HEART CATHETERIZATION WITH CORONARY ANGIOGRAM;  Surgeon: Rollene Rotunda, MD;  Location: [facility] CATH LAB;  Service: Cardiovascular;  Laterality: N/A;  . Abdominal aortagram N/A 07/19/2014    Procedure: ABDOMINAL Ronny Flurry;   Surgeon: Sherren Kerns, MD;  Location: [facility] CATH LAB;  Service: Cardiovascular;  Laterality: N/A;     Allergies  Allergen Reactions  . Penicillins Anaphylaxis  . Sulfonamide Derivatives Anaphylaxis  . Alupent [Metaproterenol Sulfate] Other (See Comments)    Makes pt "nervous"  . Azithromycin Other (See Comments)    unknown  . Linzess [Linaclotide] Nausea And Vomiting  . Omnipaque [Iohexol] Other (See Comments)    Unknown, patient denies allergy to contrast 03/06/14.  . Pantoprazole Nausea And Vomiting  . Paroxetine Other (See Comments)    unknown  . Varenicline Tartrate Other (See Comments)    unknown  . Benadryl [Diphenhydramine Hcl] Other (See Comments)    Can't breath  .  Hydrocortisone Hives  . Prednisone Hives      Family History  Problem Relation Age of Onset  . Cancer Maternal Grandmother     liver  . Cancer Maternal Grandfather     lung  . Diabetes Mother   . Diabetes Son      Social History Ms. Valiente reports that she has been smoking Cigarettes.  She has a 52 pack-year smoking history. She has never used smokeless tobacco. [name] reports that she does not drink alcohol.   Review of Systems CONSTITUTIONAL: No weight loss, fever, chills, weakness or fatigue.  HEENT: Eyes: No visual loss, blurred vision, double vision or yellow sclerae.No hearing loss, sneezing, congestion, runny nose or sore throat.  SKIN: No rash or itching.  CARDIOVASCULAR: per HPI RESPIRATORY: No shortness of breath, cough or sputum.  GASTROINTESTINAL: No anorexia, nausea, vomiting or diarrhea. No abdominal pain or blood.  GENITOURINARY: No burning on urination, no polyuria NEUROLOGICAL: No headache, dizziness, syncope, paralysis, ataxia, numbness or tingling in the extremities. No change in bowel or bladder control.  MUSCULOSKELETAL: No muscle, back pain, joint pain or stiffness.  LYMPHATICS: No enlarged nodes. No history of splenectomy.  PSYCHIATRIC: No history of depression or  anxiety.  ENDOCRINOLOGIC: No reports of sweating, cold or heat intolerance. No polyuria or polydipsia.  Marland Kitchen.   Physical Examination p 61 bp 126/67 Wt 110 lbs BMI 19 Gen: resting comfortably, no acute distress HEENT: no scleral icterus, pupils equal round and reactive, no palptable cervical adenopathy,  CV: RRR, no m/r/g, no JVD Resp: Clear to auscultation bilaterally GI: abdomen is soft, non-tender, non-distended, normal bowel sounds, no hepatosplenomegaly MSK: extremities are warm, no edema.  Skin: warm, no rash Neuro:  no focal deficits Psych: appropriate affect   Diagnostic Studies  10/2011 Echo  LVEF 55-60%, no WMAs   10/2013 Echo  LVEF 50-55%, hypokinesis of the distalanteroseptal wall, akinesis basal mid-inferolateral walls. Grade I diastolic dysfunction.   09/2012 Carotid US:  IMPRESSION:  1. Heterogeneous calcified and noncalcified atherosclerotic plaque in the right carotid bulb and proximal right internal carotid artery results in a 50 - 69% diameter stenosis. No significant interval change in the degree of stenosis compared to 10/07/2010 although subjectively there is increased calcification of the plaque.  2. Interval progression of heterogeneous predominately calcified atherosclerotic plaque in the left carotid bulb and proximal internal carotid artery resulting in a greater than 70% diameter stenosis.  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 Abnormal lexiscan MPI. There is a fixed apical defect consistent with prior infarction. There is an inferolateral wall defect with mild peri-infarct ischemia, the interpretation of this defect is limited by significant subdiaphragmatic attenuation adjacent to inferolateral wall. There is hypokinesis of this area, so likely there is a true prior infarct. Interpretation of peri-infarct ischemia of this territory may be limited by  subdiaphragmatic attenuation. Correlate findings clinically.  2. Decrased left ventricular systolic function  11/08/13 Clinic EKG  - initial ekg wandering baseline, repeated. Repeat EKG shows sinus rhythm, RBBB, old TWI inferior leads.   10/2013 Cath  Left mainstem: Heavy calcification. No high grade disease  Left anterior descending (LAD): Long proximal 50%. I have compared this lesion to the 2008 films and it appears to be unchanged. The LAD is a very large vessel wrapping the apex. The mid LAD has 30% stenosis. There are diffuse luminal irregularities. This is a mid to distal moderate to large  diagonal vessel that has mild luminal irregularities.  Left circumflex (LCx): The circumflex has had worsening stenosis with now occlusion at the ostium of a large branching OM. This OM has severe diffuse disease proximal to the bifurcation. The vessel after this is free of high grade disease.  Right coronary artery (RCA): Occluded in the mid segment.  Left ventriculography: Left ventricular systolic function is mildly reduced with , LVEF is estimated at 50%, there is no significant mitral regurgitation  Final Conclusions: Two vessel occlusion with moderate disease in the proximal LAD.  Recommendations: The patient would need to be considered for CABG if she has continued chest pain. Discussed with Dr. Wyline MoodBranch.    11/14/15 Lexiscan MPI 1. Large area of anterolateral wall ischemia extending from the apex to the base.  2. The anterolateral wall is severely hypokinetic to akinetic.  3. Left ventricular ejection fraction 38%  4. High-risk stress test findings*.  Assessment and Plan   1. CAD - mixed chest pain symptoms, recent MPI shows significant anterolateral ischemia. Cath last year showed RCA CTO, progression of OM disease that was not felt to be amenable to revasc. Will touch base with interventional cards to look at films to varify anatomy and any strategies for revasc. -  increase norvasc to 5mg  daily for additonal antianginal effects  F/u 2 months    Melissa PocheJonathan F. Mukhtar Holland, M.D.

## 2014-11-14 NOTE — Patient Instructions (Signed)
Your physician recommends that you schedule a follow-up appointment in: 2 months. Your physician has recommended you make the following change in your medication:  Increase your amlodipine to 5 mg daily. You may take (2) of your 2.5 mg daily until they are finished. Continue all other medications the same.

## 2014-11-15 ENCOUNTER — Other Ambulatory Visit (HOSPITAL_COMMUNITY): Payer: Medicare Other

## 2014-12-05 ENCOUNTER — Telehealth: Payer: Self-pay | Admitting: *Deleted

## 2014-12-05 DIAGNOSIS — I25118 Atherosclerotic heart disease of native coronary artery with other forms of angina pectoris: Secondary | ICD-10-CM

## 2014-12-05 NOTE — Telephone Encounter (Signed)
-----   Message from Antoine PocheJonathan F Branch, MD sent at 11/26/2014  1:35 PM EST ----- Please let patient know that I had her last heart cath reviewed by one of our intervention cardiologist, and we may be able to try to open up one of her blocked arteries. Please refer her to Dr Jillyn HiddenJay Varanassi initially [place].   Dominga FerryJ Branch MD ----- Message -----    From: Micheline ChapmanMichael D Cooper, MD    Sent: 11/25/2014   2:43 PM      To: Antoine PocheJonathan F Branch, MD  Lesli AlbeeHey Jonathan. I reviewed her films. The RCA and OM both are CTO's. Theron Aristaeter SwazilandJordan and Everette RankJay Varanasi have developed a very good CTO skill set and have been able to help several of these patient's with ongoing angina despite medical Rx. I would refer her to one of them for a formal consult and I suspect they will be able to recanalize one or both of these vessels.   thx Kathlene November- Mike ----- Message -----    From: Antoine PocheJonathan F Branch, MD    Sent: 11/14/2014   2:36 PM      To: Micheline ChapmanMichael D Cooper, MD  Alphonzo DublinHey Mike, Can you take a look at this patient's cath film from last year. She has had a several year history of RCA CTO, but on last cath noted worsening of OM disease that in talking with Leta JunglingJake wasn't anything that could be stented. She has had some worsening chest pain and recent abnormal Lexiscan in the circ distribution. Could it be opened as a CTO?    Dominga FerryJ Branch MD

## 2014-12-10 NOTE — Telephone Encounter (Signed)
Patient notified

## 2014-12-11 ENCOUNTER — Encounter: Payer: Self-pay | Admitting: Vascular Surgery

## 2014-12-12 ENCOUNTER — Ambulatory Visit (INDEPENDENT_AMBULATORY_CARE_PROVIDER_SITE_OTHER): Payer: Medicare Other | Admitting: Vascular Surgery

## 2014-12-12 ENCOUNTER — Encounter: Payer: Self-pay | Admitting: Vascular Surgery

## 2014-12-12 VITALS — BP 139/62 | HR 56 | Ht 63.0 in | Wt 111.8 lb

## 2014-12-12 DIAGNOSIS — I739 Peripheral vascular disease, unspecified: Secondary | ICD-10-CM

## 2014-12-12 DIAGNOSIS — Z48812 Encounter for surgical aftercare following surgery on the circulatory system: Secondary | ICD-10-CM

## 2014-12-12 DIAGNOSIS — I25118 Atherosclerotic heart disease of native coronary artery with other forms of angina pectoris: Secondary | ICD-10-CM

## 2014-12-12 NOTE — Progress Notes (Signed)
[facility] [place] HISTORY AND PHYSICAL    History of Present Illness:  Patient is a 69 y.o. year old female who presents for evaluation of peripheral arterial disease and asymptomatic moderate carotid stenosis.   She denies any symptoms of TIA amaurosis or stroke.  She continues to complain of pain in her lower extremities. She states that the left leg is the worst. She develops cramping and tightness in her leg after walking. However she primarily complains of cramping in her legs at night time which does not sound like claudication. She denies rest pain. Currently she is actually experiencing chest pain before she experiences any pain in her legs when walking. She has been referred to Dr. Eldridge Dace for cardiac evaluation. At her previous office visit she had a small ulceration on her toe which has healed spontaneously. She is a very difficult historian and tends to jump from place to place and is hard to keep on task.   Other medical problems include diabetes, coronary disease, depression and anxiety. These are currently controlled and followed primarily by Dr. [name].  She had an arteriogram in August of 2015. This showed bilateral high grade common femoral stenoses as well as bilateral superficial femoral artery occlusions. She was offered femoral popliteal bypass at that time but refused. She has previously had a left leg vein stripping.  Past Medical History  Diagnosis Date  . Diabetes mellitus   . Coronary artery disease     Severe single-vessel/100% occluded right with distal collateralization.Medical therapy/Normal LV fx  . Carotid bruit     Bilateral  . Sinus bradycardia   . Dyslipidemia   . DVT (deep venous thrombosis)     Remote portable as him   . COPD (chronic obstructive pulmonary disease)   . Arthritis   . Depression with anxiety   . Leg pain   . Productive cough   . Peripheral arterial disease   . Carotid artery occlusion     Past Surgical History   Procedure Laterality Date  . Parotidectomy      Left  . Shoulder blade      Cysts x 2  . Tonsillectomy and adenoidectomy    . Partial hysterectomy      Subsequent completion later that year,hx of large fibroid tumor  . Appendectomy    . Aortogram      with bilateral lower extremity run-off  . Abdominal hysterectomy    . Colonoscopy  12/31/2010    ZOX:WRUE tortuous, but otherwise normal-appearing colon/ Normal rectum  . Esophagogastroduodenoscopy (egd) with propofol N/A 05/16/2014    AVW:[id number] inlet patch otherwise normal/s/p dilation as described above. Antral erosions and a single gastric ulcer s/p gastric bx  . Esophageal dilation N/A 05/16/2014    Procedure: ESOPHAGEAL DILATION;  Surgeon: Corbin Ade, MD;  Location: [facility];  Service: Endoscopy;  Laterality: N/A;  #56 maloney  . Esophageal biopsy N/A 05/16/2014    Procedure: BIOPSY of stomach;  Surgeon: Corbin Ade, MD;  Location: [facility];  Service: Endoscopy;  Laterality: N/A;  . Abdominal angiogram N/A 11/12/2011    Procedure: ABDOMINAL ANGIOGRAM;  Surgeon: Sherren Kerns, MD;  Location: [facility] CATH LAB;  Service: Cardiovascular;  Laterality: N/A;  . Visceral angiogram  11/12/2011    Procedure: VISCERAL ANGIOGRAM;  Surgeon: Sherren Kerns, MD;  Location: [facility] CATH LAB;  Service: Cardiovascular;;  . Lower extremity angiogram Bilateral 11/12/2011    Procedure: LOWER EXTREMITY ANGIOGRAM;  Surgeon: Sherren Kerns, MD;  Location: [facility] CATH LAB;  Service: Cardiovascular;  Laterality: Bilateral;  . Left heart catheterization with coronary angiogram N/A 11/13/2013    Procedure: LEFT HEART CATHETERIZATION WITH CORONARY ANGIOGRAM;  Surgeon: Rollene RotundaJames Hochrein, MD;  [facility];  Service: Cardiovascular;  Laterality: N/A;  . Abdominal aortagram N/A 07/19/2014    Procedure: ABDOMINAL Ronny FlurryAORTAGRAM;  Surgeon: Sherren Kernsharles E Porche Steinberger, MD;  [facility];  Service: Cardiovascular;  Laterality: N/A;   History   Social History  .  Marital Status: Widowed    Spouse Name: N/A    Number of Children: N/A  . Years of Education: N/A   Occupational History  . Not on file.   Social History Main Topics  . Smoking status: Current Some Day Smoker -- 0.50 packs/day for 52 years    Types: Cigarettes    Start date: 01/06/1959  . Smokeless tobacco: Never Used     Comment: states not smoking as much. Uses patch sometimes  . Alcohol Use: No  . Drug Use: No  . Sexual Activity: Not on file   Other Topics Concern  . Not on file   Social History Narrative    Family History Family History     Problem    Relation    Age of Onset     .    Cancer    Maternal Grandmother                 liver     .    Cancer    Maternal Grandfather                 lung     .    Diabetes    Mother         .    Diabetes    Son           Allergies    Allergies     Allergen    Reactions     .    Penicillins    Anaphylaxis     .    Sulfonamide Derivatives    Anaphylaxis     .    Alupent (Metaproterenol Sulfate)    Other (See Comments)             Makes pt "nervous"     .    Azithromycin    Other (See Comments)             unknown     .    Omnipaque (Iohexol)    Other (See Comments)             unknown     .    Paroxetine    Other (See Comments)             unknown     .    Varenicline Tartrate    Other (See Comments)             unknown     .    Benadryl (Diphenhydramine Hcl)    Other (See Comments)             Can't breath     .    Hydrocortisone    Hives     .    Prednisone    Hives          Current Outpatient Prescriptions on File Prior to Visit  Medication Sig Dispense Refill  . albuterol (2.5 MG/3ML) 0.083% NEBU 3 mL, albuterol (5 MG/ML)  0.5% NEBU 0.5 mL Inhale 2.5 mg into the lungs every 6 (six) hours as needed.     . albuterol (VENTOLIN HFA) 108 (90 BASE) MCG/ACT inhaler Inhale 2 puffs into the lungs every 6 (six) hours as needed.    . ALPRAZolam (XANAX) 1 MG tablet Take 0.5-1 mg by mouth 4 (four) times daily as needed.  For nerves    . amLODipine (NORVASC) 5 MG tablet Take 1 tablet (5 mg total) by mouth daily. 90 tablet 3  . aspirin 81 MG tablet Take 81 mg by mouth daily.      . furosemide (LASIX) 20 MG tablet Take 20 mg by mouth daily as needed for fluid.     . metFORMIN (GLUCOPHAGE) 500 MG tablet Take 500 mg by mouth daily.     . metoprolol succinate (TOPROL-XL) 25 MG 24 hr tablet Take 12.5 mg by mouth every morning.     . Multiple Vitamin (MULTIVITAMIN WITH MINERALS) TABS tablet Take 1 tablet by mouth daily.    . nitroGLYCERIN (NITROSTAT) 0.4 MG SL tablet Place 1 tablet (0.4 mg total) under the tongue every 5 (five) minutes as needed. For chest pain. 25 tablet 3  . oxyCODONE-acetaminophen (ENDOCET) 10-325 MG per tablet Take 0.5-1 tablets by mouth every 4 (four) hours as needed for pain.     . potassium chloride (KLOR-CON) 10 MEQ CR tablet Take 10 mEq by mouth as needed. When taking Furosemide    . simvastatin (ZOCOR) 40 MG tablet Take 1 tablet (40 mg total) by mouth every evening. 90 tablet 3   No current facility-administered medications on file prior to visit.    ROS:    General:  No weight loss, Fever, chills  HEENT: No recent headaches, no nasal bleeding, no visual changes, no sore throat  Neurologic: No dizziness, blackouts, seizures. No recent symptoms of stroke or mini- stroke. No recent episodes of slurred speech, or temporary blindness.  Cardiac: + recent episodes of chest pain/pressure, no shortness of breath at rest.  No shortness of breath with exertion.  Denies history of atrial fibrillation or irregular heartbeat  Vascular: No history of rest pain in feet.  No history of non-healing ulcer  Pulmonary: No home oxygen, no productive cough, no hemoptysis,  No asthma or wheezing  Musculoskeletal:   Arthritis,  Low back pain,   Joint pain  Hematologic:No history of hypercoagulable state.  No history of easy bleeding.  No history of anemia  Gastrointestinal: No hematochezia or  melena,  No gastroesophageal reflux  Urinary:  chronic KidneyMarland Kitchen disease,  on HD -  MWF or  TTHS,  Burning with urination,  Frequent urination,  Difficulty urinating;    Skin: No rashes  Psychological: +history of anxiety,  + history of depression   Physical Examination     Filed Vitals:   12/12/14 0917 12/12/14 0918  BP: 148/62 139/62  Pulse: 56   Height:  (1.6 m)   Weight: 111 lb 12.8 oz (50.712 kg)   SpO2: 99%    General:  Alert and oriented, no acute distress HEENT: Normal Neck: + left  bruit   Pulmonary: Clear to auscultation bilaterally Cardiac: Regular Rate and Rhythm without murmur Abdomen: Soft, non-tender, non-distended, no mass Skin: No rash or ulcer Extremity Pulses:  2+ radial, brachial, femoral, absent dorsalis pedis, posterior tibial pulses bilaterally Musculoskeletal: No deformity or edema     Neurologic: Upper and lower extremity motor 5/5 and symmetric  DATA: She had bilateral ABIs and carotid  duplex previously June 2015. Carotid duplex scan shows 60-80% stenosis left side, 40-60 percent right side. ABI was 0.44 right 0.51 left in June 2015 compared to 0.73 on the left 0.55 on the right 2 years ago   ASSESSMENT:   #1 moderate carotid stenosis asymptomatic continue antiplatelet therapy and risk factor modification needs repeat carotid duplex in 6 months #2 bilateral lower extremity claudication which has become progressive was slowly declining ABI. However, she is more limited currently by chest pain. She is currently not at risk of limb loss. She will follow-up with repeat ABIs in a carotid duplex in 6 months time. I will await the evaluation of Dr. Eldridge Dace in the near future.   Fabienne Bruns, MD [facility] [place] Office: [phone] Pager: [phone]

## 2014-12-13 NOTE — Addendum Note (Signed)
Addended by: Sharee PimpleMCCHESNEY, MARILYN K on: 12/13/2014 09:01 AM   Modules accepted: Orders

## 2014-12-28 ENCOUNTER — Telehealth: Payer: Self-pay | Admitting: Gastroenterology

## 2014-12-28 NOTE — Telephone Encounter (Signed)
Patient way overdue for follow OV from EGD and pancreatic cystic lesion.

## 2014-12-30 ENCOUNTER — Encounter: Payer: Self-pay | Admitting: Internal Medicine

## 2014-12-30 NOTE — Telephone Encounter (Signed)
APPOINTMENT MADE AND LETTER SENT °

## 2014-12-30 NOTE — Telephone Encounter (Signed)
Please schedule ov.  

## 2015-01-08 ENCOUNTER — Institutional Professional Consult (permissible substitution): Payer: Medicare Other | Admitting: Interventional Cardiology

## 2015-01-08 ENCOUNTER — Encounter: Payer: Self-pay | Admitting: Interventional Cardiology

## 2015-01-08 ENCOUNTER — Encounter: Payer: Self-pay | Admitting: *Deleted

## 2015-01-08 ENCOUNTER — Ambulatory Visit (INDEPENDENT_AMBULATORY_CARE_PROVIDER_SITE_OTHER): Payer: Medicare Other | Admitting: Interventional Cardiology

## 2015-01-08 VITALS — BP 108/58 | HR 61 | Ht 63.0 in | Wt 113.0 lb

## 2015-01-08 DIAGNOSIS — I25810 Atherosclerosis of coronary artery bypass graft(s) without angina pectoris: Secondary | ICD-10-CM

## 2015-01-08 DIAGNOSIS — Z720 Tobacco use: Secondary | ICD-10-CM

## 2015-01-08 DIAGNOSIS — I739 Peripheral vascular disease, unspecified: Secondary | ICD-10-CM

## 2015-01-08 DIAGNOSIS — Z01812 Encounter for preprocedural laboratory examination: Secondary | ICD-10-CM

## 2015-01-08 NOTE — Patient Instructions (Signed)

## 2015-01-08 NOTE — Progress Notes (Signed)
Patient ID: Melissa Holland, female   DOB: Apr 10, 1946, 69 y.o.   MRN: [id number]     Patient ID: Melissa Holland MRN: [id number] DOB/AGE: Apr 08, 1946 69 y.o.   Referring Physician Dr. branch   Reason for Consultation coronary artery disease including chronic total occlusions of the RCA and left circumflex  HPI: 69 year old woman who has an extensive history of coronary artery disease. In 2008, she was diagnosed with an occluded right coronary artery and was medically managed. In 2014, she underwent cardiac catheterization and was found to have an occluded mid circumflex along with the occluded mid right coronary artery. She had moderate disease in the LAD. She was managed medically at that time. She also has a history of lower extremity arterial disease. She is managed by Dr. Darrick Penna.  Over the last few months, she reports worsening discomfort in her chest with walking. It feels like a tightness. She has to stop walking and take deep breaths and she has relief. She has had some relief from nitroglycerin as well.  Is taking less activity for her symptoms to come on. She has been okay at rest. She just feels that she is limited in the activities that she can do.  She has now moved into an assisted living facility. She felt she was unable to care for self. She does not have much family support. She admits to being quite anxious at times.   Current Outpatient Prescriptions  Medication Sig Dispense Refill  . albuterol (2.5 MG/3ML) 0.083% NEBU 3 mL, albuterol (5 MG/ML) 0.5% NEBU 0.5 mL Inhale 2.5 mg into the lungs every 6 (six) hours as needed.     Marland Kitchen albuterol (VENTOLIN HFA) 108 (90 BASE) MCG/ACT inhaler Inhale 2 puffs into the lungs every 6 (six) hours as needed.    . ALPRAZolam (XANAX) 1 MG tablet Take 0.5-1 mg by mouth 4 (four) times daily as needed. For nerves    . amLODipine (NORVASC) 5 MG tablet Take 1 tablet (5 mg total) by mouth daily. 90 tablet 3  . aspirin 81 MG tablet Take 81 mg by  mouth daily.      . furosemide (LASIX) 20 MG tablet Take 20 mg by mouth daily as needed for fluid.     . metFORMIN (GLUCOPHAGE) 500 MG tablet Take 500 mg by mouth 2 (two) times daily.     . metoprolol succinate (TOPROL-XL) 25 MG 24 hr tablet Take 12.5 mg by mouth every morning.     . Multiple Vitamin (MULTIVITAMIN WITH MINERALS) TABS tablet Take 1 tablet by mouth daily.    . nitroGLYCERIN (NITROSTAT) 0.4 MG SL tablet Place 1 tablet (0.4 mg total) under the tongue every 5 (five) minutes as needed. For chest pain. 25 tablet 3  . oxyCODONE-acetaminophen (ENDOCET) 10-325 MG per tablet Take 0.5-1 tablets by mouth every 4 (four) hours as needed for pain.     . potassium chloride (KLOR-CON) 10 MEQ CR tablet Take 10 mEq by mouth as needed. When taking Furosemide    . simvastatin (ZOCOR) 40 MG tablet Take 1 tablet (40 mg total) by mouth every evening. 90 tablet 3   No current facility-administered medications for this visit.   Past Medical History  Diagnosis Date  . Diabetes mellitus   . Coronary artery disease     Severe single-vessel/100% occluded right with distal collateralization.Medical therapy/Normal LV fx  . Carotid bruit     Bilateral  . Sinus bradycardia   . Dyslipidemia   . DVT (deep venous  thrombosis)     Remote portable as him   . COPD (chronic obstructive pulmonary disease)   . Arthritis   . Depression with anxiety   . Leg pain   . Productive cough   . Peripheral arterial disease   . Carotid artery occlusion     Family History  Problem Relation Age of Onset  . Cancer Maternal Grandmother     liver  . Cancer Maternal Grandfather     lung  . Diabetes Mother   . Diabetes Son     History   Social History  . Marital Status: Widowed    Spouse Name: N/A  . Number of Children: N/A  . Years of Education: N/A   Occupational History  . Not on file.   Social History Main Topics  . Smoking status: Current Some Day Smoker -- 0.50 packs/day for 52 years    Types: Cigarettes     Start date: 01/06/1959  . Smokeless tobacco: Never Used     Comment: states not smoking as much. Uses patch sometimes  . Alcohol Use: No  . Drug Use: No  . Sexual Activity: Not on file   Other Topics Concern  . Not on file   Social History Narrative    Past Surgical History  Procedure Laterality Date  . Parotidectomy      Left  . Shoulder blade      Cysts x 2  . Tonsillectomy and adenoidectomy    . Partial hysterectomy      Subsequent completion later that year,hx of large fibroid tumor  . Appendectomy    . Aortogram      with bilateral lower extremity run-off  . Abdominal hysterectomy    . Colonoscopy  12/31/2010    ZOX:WRUE tortuous, but otherwise normal-appearing colon/ Normal rectum  . Esophagogastroduodenoscopy (egd) with propofol N/A 05/16/2014    AVW:[id number] inlet patch otherwise normal/s/p dilation as described above. Antral erosions and a single gastric ulcer s/p gastric bx  . Esophageal dilation N/A 05/16/2014    Procedure: ESOPHAGEAL DILATION;  Surgeon: Corbin Ade, MD;  Location: [facility];  Service: Endoscopy;  Laterality: N/A;  #56 maloney  . Esophageal biopsy N/A 05/16/2014    Procedure: BIOPSY of stomach;  Surgeon: Corbin Ade, MD;  Location: [facility];  Service: Endoscopy;  Laterality: N/A;  . Abdominal angiogram N/A 11/12/2011    Procedure: ABDOMINAL ANGIOGRAM;  Surgeon: Sherren Kerns, MD;  Location: [facility] CATH LAB;  Service: Cardiovascular;  Laterality: N/A;  . Visceral angiogram  11/12/2011    Procedure: VISCERAL ANGIOGRAM;  Surgeon: Sherren Kerns, MD;  Location: [facility] CATH LAB;  Service: Cardiovascular;;  . Lower extremity angiogram Bilateral 11/12/2011    Procedure: LOWER EXTREMITY ANGIOGRAM;  Surgeon: Sherren Kerns, MD;  Location: [facility] CATH LAB;  Service: Cardiovascular;  Laterality: Bilateral;  . Left heart catheterization with coronary angiogram N/A 11/13/2013    Procedure: LEFT HEART CATHETERIZATION WITH CORONARY ANGIOGRAM;  Surgeon: Rollene Rotunda, MD;  Location: [facility] CATH LAB;  Service: Cardiovascular;  Laterality: N/A;  . Abdominal aortagram N/A 07/19/2014    Procedure: ABDOMINAL Ronny Flurry;  Surgeon: Sherren Kerns, MD;  Location: [facility] CATH LAB;  Service: Cardiovascular;  Laterality: N/A;      (Not in a hospital admission)  Review of systems complete and found to be negative unless listed above .  No nausea, vomiting.  No fever chills, No focal weakness,  No palpitations.  Physical Exam: Filed Vitals:   01/08/15 1608  BP: 108/58  Pulse: 61    Weight: 113 lb (51.256 kg)  Physical exam: No apparent distress [place]/AT EOMI No JVD,  RRR S1S2  No wheezing Soft. NT, nondistended No edema. Palpable femoral pulses bilaterally. Palpable radial pulses bilaterally No focal motor or sensory deficits Anxious affect  Labs:   Lab Results  Component Value Date   WBC 5.3 03/06/2014   HGB 16.0* 07/19/2014   HCT 47.0* 07/19/2014   MCV 82.1 03/06/2014   PLT 206 03/06/2014   No results for input(s): NA, K, CL, CO2, BUN, CREATININE, CALCIUM, PROT, BILITOT, ALKPHOS, ALT, AST, GLUCOSE in the last 168 hours.  Invalid input(s): LABALBU No results found for: CKTOTAL, CKMB, CKMBINDEX, TROPONINI No results found for: CHOL No results found for: HDL No results found for: LDLCALC No results found for: TRIG No results found for: CHOLHDL No results found for: LDLDIRECT     WUJ:WJXBJEKG:sinus bradycardia with right bundle branch block  ASSESSMENT AND PLAN: I personally reviewed her cardiac cath films from 2008 in 2014.  1) coronary artery disease: Would plan for repeat cardiac catheterization. It is possible that her disease has progressed and that is why she has worsening symptoms. If her LAD disease has progressed and is now significant, would have to consider bypass surgery. Her RCA CTO is quite old. The circumflex CTO is more recent. There is also significant disease up to the ostium of the circumflex. If her LAD disease is not significant,and  the circumflex has not changed much from 2014, this may be approachable percutaneously.  Of note, patient has significant anxiety; and is somewhat depressed about her lack of family support. She states that she has a son who lives in the area but he does not help her at all. She does not have a great understanding of the complexities of her coronary artery disease as well. We talked at length about this. A diagram was given to her as well.   Tobacco abuse: I stressed the importance of stopping smoking. She states that she has cut back.  The risks and benefits of cardiac catheterization were explained to the patient and she is willing to proceed. We'll plan to have the procedure done in the next few weeks. Signed:   Fredric MareJay S. Inetha Maret, MD, [facility] 01/08/2015, 4:29 PM

## 2015-01-09 ENCOUNTER — Telehealth: Payer: Self-pay | Admitting: Interventional Cardiology

## 2015-01-09 NOTE — Telephone Encounter (Signed)
New message     Patient has question regarding contacts dye use in the cath.

## 2015-01-09 NOTE — Telephone Encounter (Signed)
Pt called wanting to know if there would be contrast dye involved with her catheterization. Informed pt there would be. Pt stated that she will take her PM dose of Metformin on the 20th and will not resume it until the morning dose on the 28th. Pt states that she has always done her Metformin this way anytime she has to have dye because she was told these instructions by another physician due to her kidneys. Informed pt that I would make Dr. Eldridge DaceVaranasi aware of her plan and would call her back if he had any recommendations. Pt verbalized understanding and was in agreement.

## 2015-01-10 ENCOUNTER — Ambulatory Visit: Payer: Medicare Other | Admitting: Cardiology

## 2015-01-10 ENCOUNTER — Other Ambulatory Visit: Payer: Self-pay | Admitting: Interventional Cardiology

## 2015-01-10 DIAGNOSIS — I200 Unstable angina: Secondary | ICD-10-CM

## 2015-01-10 DIAGNOSIS — I2510 Atherosclerotic heart disease of native coronary artery without angina pectoris: Secondary | ICD-10-CM

## 2015-01-14 ENCOUNTER — Ambulatory Visit: Payer: Medicare Other | Admitting: Gastroenterology

## 2015-01-15 ENCOUNTER — Telehealth: Payer: Self-pay | Admitting: Interventional Cardiology

## 2015-01-15 NOTE — Telephone Encounter (Signed)
New message      Want nurse to know that she had her labs drawn today at [facility]

## 2015-01-15 NOTE — Telephone Encounter (Signed)
No results yet in system

## 2015-01-16 NOTE — Telephone Encounter (Signed)
F/U       Pt calling, states she is now taking Cipro for a UTI and would like to know if this will affect Cath procedure?  Pt also wants to discuss updated medications.  Please return call.

## 2015-01-16 NOTE — Telephone Encounter (Signed)
Spoke with pt and informed her that I spoke with the pharmacist on duty. Informed pt that as advised by the pharmacist that the Cipro will not interfere with her scheduled Cath. Pt verbalized understanding and was in agreement.

## 2015-01-16 NOTE — Telephone Encounter (Signed)
Spoke with pt in regard to medications. Pt informed me that the only change is the new prescription for Cipro for UTI.  Informed pt that I will speak with the pharmacist in regards to this and call her back. Pt verbalized understanding and was in agreement with this plan.

## 2015-01-20 ENCOUNTER — Telehealth: Payer: Self-pay | Admitting: Physician Assistant

## 2015-01-20 NOTE — Telephone Encounter (Signed)
Patient contacted after hour service as she was instructed by triage nurse to call back as soon as she receive the message on her answering machine regarding her scheduled cath on 01/22/2015. I told her this is after hour service and I am unable to find this triage nurse who likely went home.   Considering she has upcoming cath on 01/22/2015, i have instructed her to be NPO past midnight on the day of procedure and hold her Metformin. She states she has been holding metformin since 01/18/2015.  I will leave her contact info with Card Master who will forward this to triage nurse to contact patient tomorrow.  Ramond DialSigned, Camran Keady PA Pager: [phone]

## 2015-01-21 ENCOUNTER — Encounter: Payer: Self-pay | Admitting: Interventional Cardiology

## 2015-01-21 NOTE — Telephone Encounter (Signed)
Spoke with pt and gave her information below regarding lab work and cath.

## 2015-01-21 NOTE — Telephone Encounter (Signed)
See note dated 01/20/15 for documentation related to this call.

## 2015-01-21 NOTE — Telephone Encounter (Signed)
Spoke with pt and confirmed she has instruction letter for cath.  I told her to follow these instructions regarding medications. She confirms she has already stopped metformin.

## 2015-01-21 NOTE — Telephone Encounter (Signed)
Spoke with Constance HolsterJennifer Bowers, LPN yesterday afternoon and call was placed to pt to let her know [facility] reviewed by Dr. Eldridge DaceVaranasi.  Per Dr. Eldridge DaceVaranasi labs are OK and pt may proceed with cath as planned.  I placed call to pt to give her this information and left message to call back

## 2015-01-21 NOTE — Telephone Encounter (Signed)
Follow Up  Pt calling to speak w/ Rn about Cath- prescriptions she should stop before procedure. Please call back and discuss.

## 2015-01-21 NOTE — Telephone Encounter (Signed)
Follow Up  Pt returning phone call. Please call back and discuss.  

## 2015-01-22 ENCOUNTER — Encounter (HOSPITAL_COMMUNITY): Payer: Self-pay | Admitting: General Practice

## 2015-01-22 ENCOUNTER — Ambulatory Visit (HOSPITAL_COMMUNITY)
Admission: RE | Admit: 2015-01-22 | Discharge: 2015-01-23 | Disposition: A | Discharge status: Home / Self Care | Payer: Medicare Other | Source: Ambulatory Visit | Attending: Interventional Cardiology | Admitting: Interventional Cardiology

## 2015-01-22 ENCOUNTER — Encounter (HOSPITAL_COMMUNITY)
Admission: RE | Disposition: A | Discharge status: Home / Self Care | Payer: Self-pay | Source: Ambulatory Visit | Attending: Interventional Cardiology

## 2015-01-22 DIAGNOSIS — I25110 Atherosclerotic heart disease of native coronary artery with unstable angina pectoris: Secondary | ICD-10-CM | POA: Diagnosis not present

## 2015-01-22 DIAGNOSIS — E7800 Pure hypercholesterolemia, unspecified: Secondary | ICD-10-CM | POA: Diagnosis present

## 2015-01-22 DIAGNOSIS — F17210 Nicotine dependence, cigarettes, uncomplicated: Secondary | ICD-10-CM | POA: Diagnosis not present

## 2015-01-22 DIAGNOSIS — I200 Unstable angina: Secondary | ICD-10-CM

## 2015-01-22 DIAGNOSIS — I2582 Chronic total occlusion of coronary artery: Secondary | ICD-10-CM | POA: Insufficient documentation

## 2015-01-22 DIAGNOSIS — J449 Chronic obstructive pulmonary disease, unspecified: Secondary | ICD-10-CM | POA: Insufficient documentation

## 2015-01-22 DIAGNOSIS — K219 Gastro-esophageal reflux disease without esophagitis: Secondary | ICD-10-CM | POA: Diagnosis present

## 2015-01-22 DIAGNOSIS — F329 Major depressive disorder, single episode, unspecified: Secondary | ICD-10-CM | POA: Diagnosis not present

## 2015-01-22 DIAGNOSIS — I25119 Atherosclerotic heart disease of native coronary artery with unspecified angina pectoris: Secondary | ICD-10-CM | POA: Diagnosis present

## 2015-01-22 DIAGNOSIS — Z7982 Long term (current) use of aspirin: Secondary | ICD-10-CM | POA: Insufficient documentation

## 2015-01-22 DIAGNOSIS — I2510 Atherosclerotic heart disease of native coronary artery without angina pectoris: Secondary | ICD-10-CM | POA: Insufficient documentation

## 2015-01-22 DIAGNOSIS — I739 Peripheral vascular disease, unspecified: Secondary | ICD-10-CM | POA: Diagnosis not present

## 2015-01-22 DIAGNOSIS — E119 Type 2 diabetes mellitus without complications: Secondary | ICD-10-CM

## 2015-01-22 DIAGNOSIS — I6529 Occlusion and stenosis of unspecified carotid artery: Secondary | ICD-10-CM | POA: Insufficient documentation

## 2015-01-22 DIAGNOSIS — E785 Hyperlipidemia, unspecified: Secondary | ICD-10-CM | POA: Insufficient documentation

## 2015-01-22 DIAGNOSIS — I25118 Atherosclerotic heart disease of native coronary artery with other forms of angina pectoris: Secondary | ICD-10-CM

## 2015-01-22 DIAGNOSIS — Z86718 Personal history of other venous thrombosis and embolism: Secondary | ICD-10-CM | POA: Diagnosis not present

## 2015-01-22 DIAGNOSIS — Z720 Tobacco use: Secondary | ICD-10-CM | POA: Diagnosis present

## 2015-01-22 DIAGNOSIS — F419 Anxiety disorder, unspecified: Secondary | ICD-10-CM | POA: Insufficient documentation

## 2015-01-22 HISTORY — DX: Type 2 diabetes mellitus without complications: E11.9

## 2015-01-22 HISTORY — DX: Umbilical hernia without obstruction or gangrene: K42.9

## 2015-01-22 HISTORY — DX: Claustrophobia: F40.240

## 2015-01-22 HISTORY — PX: CORONARY ANGIOPLASTY: SHX604

## 2015-01-22 HISTORY — DX: Major depressive disorder, single episode, unspecified: F32.9

## 2015-01-22 HISTORY — DX: Depression, unspecified: F32.A

## 2015-01-22 HISTORY — DX: Anxiety disorder, unspecified: F41.9

## 2015-01-22 HISTORY — PX: PERCUTANEOUS CORONARY STENT INTERVENTION (PCI-S): SHX5485

## 2015-01-22 HISTORY — PX: CARDIAC CATHETERIZATION: SHX172

## 2015-01-22 HISTORY — DX: Gastro-esophageal reflux disease without esophagitis: K21.9

## 2015-01-22 LAB — POCT ACTIVATED CLOTTING TIME
ACTIVATED CLOTTING TIME: 257 s
ACTIVATED CLOTTING TIME: 171 s
Activated Clotting Time: 245 seconds
Activated Clotting Time: 282 seconds
Activated Clotting Time: 233 seconds

## 2015-01-22 LAB — GLUCOSE, CAPILLARY
GLUCOSE-CAPILLARY: 124 mg/dL — AB (ref 70–99)
GLUCOSE-CAPILLARY: 230 mg/dL — AB (ref 70–99)
Glucose-Capillary: 107 mg/dL — ABNORMAL HIGH (ref 70–99)
Glucose-Capillary: 97 mg/dL (ref 70–99)

## 2015-01-22 LAB — CBC
HCT: 36.1 % (ref 36.0–46.0)
HEMOGLOBIN: 12.2 g/dL (ref 12.0–15.0)
MCH: 27.9 pg (ref 26.0–34.0)
MCHC: 33.8 g/dL (ref 30.0–36.0)
MCV: 82.4 fL (ref 78.0–100.0)
Platelets: 158 10*3/uL (ref 150–400)
RBC: 4.38 MIL/uL (ref 3.87–5.11)
RDW: 13 % (ref 11.5–15.5)
WBC: 4 10*3/uL (ref 4.0–10.5)

## 2015-01-22 LAB — CREATININE, SERUM
Creatinine, Ser: 0.62 mg/dL (ref 0.50–1.10)
GFR calc Af Amer: >90 mL/min (ref >90)
GFR calc non Af Amer: 89 mL/min — ABNORMAL LOW (ref >90)

## 2015-01-22 SURGERY — PERCUTANEOUS CORONARY STENT INTERVENTION (PCI-S)
Anesthesia: LOCAL

## 2015-01-22 MED ORDER — NITROGLYCERIN 0.4 MG SL SUBL: 0.4000 mg | SUBLINGUAL_TABLET | SUBLINGUAL | Status: DC | PRN

## 2015-01-22 MED ORDER — SODIUM CHLORIDE 0.9 % IV SOLN: 250.0000 mL | INTRAVENOUS | Status: DC | PRN

## 2015-01-22 MED ORDER — CLOPIDOGREL BISULFATE 300 MG PO TABS
ORAL_TABLET | ORAL | Status: AC
  Filled 2015-01-22: qty 1

## 2015-01-22 MED ORDER — FENTANYL CITRATE 0.05 MG/ML IJ SOLN
INTRAMUSCULAR | Status: AC
  Filled 2015-01-22: qty 2

## 2015-01-22 MED ORDER — ALPRAZOLAM 0.5 MG PO TABS
0.5000 mg | ORAL_TABLET | Freq: Four times a day (QID) | ORAL | Status: DC | PRN
  Administered 2015-01-22 (×2): 1 mg via ORAL
  Administered 2015-01-23 (×2): 0.5 mg via ORAL
  Filled 2015-01-22 (×2): qty 2
  Filled 2015-01-22: qty 1
  Filled 2015-01-22: qty 2

## 2015-01-22 MED ORDER — HEPARIN (PORCINE) IN NACL 2-0.9 UNIT/ML-% IJ SOLN
INTRAMUSCULAR | Status: AC
  Filled 2015-01-22: qty 1000

## 2015-01-22 MED ORDER — SIMVASTATIN 40 MG PO TABS
40.0000 mg | ORAL_TABLET | Freq: Every evening | ORAL | Status: DC
  Administered 2015-01-22: 19:00:00 40 mg via ORAL
  Filled 2015-01-22 (×2): qty 1

## 2015-01-22 MED ORDER — OXYCODONE-ACETAMINOPHEN 10-325 MG PO TABS: 0.5000 | ORAL_TABLET | ORAL | Status: DC | PRN

## 2015-01-22 MED ORDER — ALBUTEROL SULFATE (2.5 MG/3ML) 0.083% IN NEBU: 2.5000 mg | INHALATION_SOLUTION | Freq: Four times a day (QID) | RESPIRATORY_TRACT | Status: DC | PRN

## 2015-01-22 MED ORDER — POTASSIUM CHLORIDE CRYS ER 20 MEQ PO TBCR
10.0000 meq | EXTENDED_RELEASE_TABLET | Freq: Every day | ORAL | Status: DC
  Administered 2015-01-22: 15:00:00 10 meq via ORAL
  Filled 2015-01-22: qty 0.5

## 2015-01-22 MED ORDER — ASPIRIN 81 MG PO CHEW
81.0000 mg | CHEWABLE_TABLET | ORAL | Status: AC
  Administered 2015-01-22: 81 mg via ORAL
  Filled 2015-01-22: qty 1

## 2015-01-22 MED ORDER — LIDOCAINE HCL (PF) 1 % IJ SOLN
INTRAMUSCULAR | Status: AC
  Filled 2015-01-22: qty 30

## 2015-01-22 MED ORDER — OXYCODONE HCL 5 MG PO TABS
5.0000 mg | ORAL_TABLET | ORAL | Status: DC | PRN
  Administered 2015-01-22 – 2015-01-23 (×3): 10 mg via ORAL
  Administered 2015-01-23: 5 mg via ORAL
  Filled 2015-01-22 (×2): qty 2
  Filled 2015-01-22: qty 1
  Filled 2015-01-22: qty 2

## 2015-01-22 MED ORDER — ASPIRIN 81 MG PO CHEW: 81.0000 mg | CHEWABLE_TABLET | Freq: Every day | ORAL | Status: DC

## 2015-01-22 MED ORDER — NITROGLYCERIN 1 MG/10 ML FOR IR/CATH LAB
INTRA_ARTERIAL | Status: AC
  Filled 2015-01-22: qty 10

## 2015-01-22 MED ORDER — HEPARIN SODIUM (PORCINE) 5000 UNIT/ML IJ SOLN
5000.0000 [IU] | Freq: Three times a day (TID) | INTRAMUSCULAR | Status: DC
  Filled 2015-01-22: qty 1

## 2015-01-22 MED ORDER — SODIUM CHLORIDE 0.9 % IV SOLN
INTRAVENOUS | Status: DC
  Administered 2015-01-22: 09:00:00 via INTRAVENOUS

## 2015-01-22 MED ORDER — MIDAZOLAM HCL 2 MG/2ML IJ SOLN
INTRAMUSCULAR | Status: AC
  Filled 2015-01-22: qty 2

## 2015-01-22 MED ORDER — ASPIRIN 81 MG PO TABS
81.0000 mg | ORAL_TABLET | Freq: Every day | ORAL | Status: DC
  Filled 2015-01-22 (×3): qty 1

## 2015-01-22 MED ORDER — ASPIRIN 81 MG PO CHEW
CHEWABLE_TABLET | ORAL | Status: AC
  Administered 2015-01-22: 81 mg via ORAL
  Filled 2015-01-22: qty 1

## 2015-01-22 MED ORDER — METOPROLOL SUCCINATE 12.5 MG HALF TABLET
12.5000 mg | ORAL_TABLET | Freq: Every day | ORAL | Status: DC
  Administered 2015-01-22 – 2015-01-23 (×2): 12.5 mg via ORAL
  Filled 2015-01-22 (×2): qty 1

## 2015-01-22 MED ORDER — OXYCODONE-ACETAMINOPHEN 5-325 MG PO TABS
0.5000 | ORAL_TABLET | ORAL | Status: DC | PRN
  Administered 2015-01-22 – 2015-01-23 (×4): 1 via ORAL
  Filled 2015-01-22 (×4): qty 1

## 2015-01-22 MED ORDER — ENSURE COMPLETE PO LIQD
237.0000 mL | Freq: Two times a day (BID) | ORAL | Status: DC
  Filled 2015-01-22 (×4): qty 237

## 2015-01-22 MED ORDER — AMLODIPINE BESYLATE 5 MG PO TABS
5.0000 mg | ORAL_TABLET | Freq: Every day | ORAL | Status: DC
  Administered 2015-01-22 – 2015-01-23 (×2): 5 mg via ORAL
  Filled 2015-01-22 (×2): qty 1

## 2015-01-22 MED ORDER — GUAIFENESIN 100 MG/5ML PO SYRP
200.0000 mg | ORAL_SOLUTION | Freq: Three times a day (TID) | ORAL | Status: DC | PRN
  Administered 2015-01-23: 200 mg via ORAL
  Filled 2015-01-22 (×2): qty 10

## 2015-01-22 MED ORDER — CLOPIDOGREL BISULFATE 75 MG PO TABS
75.0000 mg | ORAL_TABLET | Freq: Every day | ORAL | Status: DC
  Administered 2015-01-23: 75 mg via ORAL
  Filled 2015-01-22: qty 1

## 2015-01-22 MED ORDER — ONDANSETRON HCL 4 MG/2ML IJ SOLN: 4.0000 mg | Freq: Four times a day (QID) | INTRAMUSCULAR | Status: DC | PRN

## 2015-01-22 MED ORDER — SODIUM CHLORIDE 0.9 % IJ SOLN: 3.0000 mL | INTRAMUSCULAR | Status: DC | PRN

## 2015-01-22 MED ORDER — ACETAMINOPHEN 325 MG PO TABS: 650.0000 mg | ORAL_TABLET | ORAL | Status: DC | PRN

## 2015-01-22 MED ORDER — SODIUM CHLORIDE 0.9 % IJ SOLN: 3.0000 mL | Freq: Two times a day (BID) | INTRAMUSCULAR | Status: DC

## 2015-01-22 MED ORDER — SODIUM CHLORIDE 0.9 % IV SOLN: 1.0000 mL/kg/h | INTRAVENOUS | Status: AC

## 2015-01-22 NOTE — H&P (View-Only) (Signed)
Patient ID: Melissa Holland, female   DOB: 05/20/1946, 69 y.o.   MRN: [id number]     Patient ID: Melissa Holland MRN: [id number] DOB/AGE: 08/02/1946 69 y.o.   Referring Physician Dr. branch   Reason for Consultation coronary artery disease including chronic total occlusions of the RCA and left circumflex  HPI: 69-year-old woman who has an extensive history of coronary artery disease. In 2008, she was diagnosed with an occluded right coronary artery and was medically managed. In 2014, she underwent cardiac catheterization and was found to have an occluded mid circumflex along with the occluded mid right coronary artery. She had moderate disease in the LAD. She was managed medically at that time. She also has a history of lower extremity arterial disease. She is managed by Dr. Fields.  Over the last few months, she reports worsening discomfort in her chest with walking. It feels like a tightness. She has to stop walking and take deep breaths and she has relief. She has had some relief from nitroglycerin as well.  Is taking less activity for her symptoms to come on. She has been okay at rest. She just feels that she is limited in the activities that she can do.  She has now moved into an assisted living facility. She felt she was unable to care for self. She does not have much family support. She admits to being quite anxious at times.   Current Outpatient Prescriptions  Medication Sig Dispense Refill  . albuterol (2.5 MG/3ML) 0.083% NEBU 3 mL, albuterol (5 MG/ML) 0.5% NEBU 0.5 mL Inhale 2.5 mg into the lungs every 6 (six) hours as needed.     . albuterol (VENTOLIN HFA) 108 (90 BASE) MCG/ACT inhaler Inhale 2 puffs into the lungs every 6 (six) hours as needed.    . ALPRAZolam (XANAX) 1 MG tablet Take 0.5-1 mg by mouth 4 (four) times daily as needed. For nerves    . amLODipine (NORVASC) 5 MG tablet Take 1 tablet (5 mg total) by mouth daily. 90 tablet 3  . aspirin 81 MG tablet Take 81 mg by  mouth daily.      . furosemide (LASIX) 20 MG tablet Take 20 mg by mouth daily as needed for fluid.     . metFORMIN (GLUCOPHAGE) 500 MG tablet Take 500 mg by mouth 2 (two) times daily.     . metoprolol succinate (TOPROL-XL) 25 MG 24 hr tablet Take 12.5 mg by mouth every morning.     . Multiple Vitamin (MULTIVITAMIN WITH MINERALS) TABS tablet Take 1 tablet by mouth daily.    . nitroGLYCERIN (NITROSTAT) 0.4 MG SL tablet Place 1 tablet (0.4 mg total) under the tongue every 5 (five) minutes as needed. For chest pain. 25 tablet 3  . oxyCODONE-acetaminophen (ENDOCET) 10-325 MG per tablet Take 0.5-1 tablets by mouth every 4 (four) hours as needed for pain.     . potassium chloride (KLOR-CON) 10 MEQ CR tablet Take 10 mEq by mouth as needed. When taking Furosemide    . simvastatin (ZOCOR) 40 MG tablet Take 1 tablet (40 mg total) by mouth every evening. 90 tablet 3   No current facility-administered medications for this visit.   Past Medical History  Diagnosis Date  . Diabetes mellitus   . Coronary artery disease     Severe single-vessel/100% occluded right with distal collateralization.Medical therapy/Normal LV fx  . Carotid bruit     Bilateral  . Sinus bradycardia   . Dyslipidemia   . DVT (deep venous   thrombosis)     Remote portable as him   . COPD (chronic obstructive pulmonary disease)   . Arthritis   . Depression with anxiety   . Leg pain   . Productive cough   . Peripheral arterial disease   . Carotid artery occlusion     Family History  Problem Relation Age of Onset  . Cancer Maternal Grandmother     liver  . Cancer Maternal Grandfather     lung  . Diabetes Mother   . Diabetes Son     History   Social History  . Marital Status: Widowed    Spouse Name: N/A  . Number of Children: N/A  . Years of Education: N/A   Occupational History  . Not on file.   Social History Main Topics  . Smoking status: Current Some Day Smoker -- 0.50 packs/day for 52 years    Types: Cigarettes     Start date: 01/06/1959  . Smokeless tobacco: Never Used     Comment: states not smoking as much. Uses patch sometimes  . Alcohol Use: No  . Drug Use: No  . Sexual Activity: Not on file   Other Topics Concern  . Not on file   Social History Narrative    Past Surgical History  Procedure Laterality Date  . Parotidectomy      Left  . Shoulder blade      Cysts x 2  . Tonsillectomy and adenoidectomy    . Partial hysterectomy      Subsequent completion later that year,hx of large fibroid tumor  . Appendectomy    . Aortogram      with bilateral lower extremity run-off  . Abdominal hysterectomy    . Colonoscopy  12/31/2010    RMR:Long tortuous, but otherwise normal-appearing colon/ Normal rectum  . Esophagogastroduodenoscopy (egd) with propofol N/A 05/16/2014    RMR:Esophageal inlet patch otherwise normal/s/p dilation as described above. Antral erosions and a single gastric ulcer s/p gastric bx  . Esophageal dilation N/A 05/16/2014    Procedure: ESOPHAGEAL DILATION;  Surgeon: Robert M Rourk, MD;  Location: [facility];  Service: Endoscopy;  Laterality: N/A;  #56 maloney  . Esophageal biopsy N/A 05/16/2014    Procedure: BIOPSY of stomach;  Surgeon: Robert M Rourk, MD;  Location: [facility];  Service: Endoscopy;  Laterality: N/A;  . Abdominal angiogram N/A 11/12/2011    Procedure: ABDOMINAL ANGIOGRAM;  Surgeon: Charles E Fields, MD;  Location: [facility] CATH LAB;  Service: Cardiovascular;  Laterality: N/A;  . Visceral angiogram  11/12/2011    Procedure: VISCERAL ANGIOGRAM;  Surgeon: Charles E Fields, MD;  Location: [facility] CATH [facility];  Service: Cardiovascular;;  . Lower extremity angiogram Bilateral 11/12/2011    Procedure: LOWER EXTREMITY ANGIOGRAM;  Surgeon: Charles E Fields, MD;  Location: [facility] C[facility];  Service: Cardiovascular;  Laterality: Bilateral;  . Left heart catheterization with coronary angiogram N/A 11/13/2013    Procedure: LEFT HEART CATHETERIZATION WITH CORONARY ANGIOGRAM;  Surgeon: James  Hochrein, MD;  Location: [facility] CATH LAB;  Service: Cardiovascular;  Laterality: N/A;  . Abdominal aortagram N/A 07/19/2014    Procedure: ABDOMINAL AORTAGRAM;  Surgeon: Charles E Fields, MD;  Location: [facility] CATH LAB;  Service: Cardiovascular;  Laterality: N/A;      (Not in a hospital admission)  Review of systems complete and found to be negative unless listed above .  No nausea, vomiting.  No fever chills, No focal weakness,  No palpitations.  Physical Exam: Filed Vitals:   01/08/15 1608    BP: 108/58  Pulse: 61    Weight: 113 lb (51.256 kg)  Physical exam: No apparent distress [place]/AT EOMI No JVD,  RRR S1S2  No wheezing Soft. NT, nondistended No edema. Palpable femoral pulses bilaterally. Palpable radial pulses bilaterally No focal motor or sensory deficits Anxious affect  Labs:   Lab Results  Component Value Date   WBC 5.3 03/06/2014   HGB 16.0* 07/19/2014   HCT 47.0* 07/19/2014   MCV 82.1 03/06/2014   PLT 206 03/06/2014   No results for input(s): NA, K, CL, CO2, BUN, CREATININE, CALCIUM, PROT, BILITOT, ALKPHOS, ALT, AST, GLUCOSE in the last 168 hours.  Invalid input(s): LABALBU No results found for: CKTOTAL, CKMB, CKMBINDEX, TROPONINI No results found for: CHOL No results found for: HDL No results found for: LDLCALC No results found for: TRIG No results found for: CHOLHDL No results found for: LDLDIRECT     EKG:sinus bradycardia with right bundle branch block  ASSESSMENT AND PLAN: I personally reviewed her cardiac cath films from 2008 in 2014.  1) coronary artery disease: Would plan for repeat cardiac catheterization. It is possible that her disease has progressed and that is why she has worsening symptoms. If her LAD disease has progressed and is now significant, would have to consider bypass surgery. Her RCA CTO is quite old. The circumflex CTO is more recent. There is also significant disease up to the ostium of the circumflex. If her LAD disease is not significant,and  the circumflex has not changed much from 2014, this may be approachable percutaneously.  Of note, patient has significant anxiety; and is somewhat depressed about her lack of family support. She states that she has a son who lives in the area but he does not help her at all. She does not have a great understanding of the complexities of her coronary artery disease as well. We talked at length about this. A diagram was given to her as well.   Tobacco abuse: I stressed the importance of stopping smoking. She states that she has cut back.  The risks and benefits of cardiac catheterization were explained to the patient and she is willing to proceed. We'll plan to have the procedure done in the next few weeks. Signed:   Jay S. Trevaun Rendleman, MD, FACC 01/08/2015, 4:29 PM   

## 2015-01-22 NOTE — Progress Notes (Signed)
Site area: right groin  Site Prior to Removal:  Level 0  Pressure Applied For 35 MINUTES    Minutes Beginning at 1635  Manual:   Yes.    Patient Status During Pull:  AAO X 4  Post Pull Groin Site:  Level 0  Post Pull Instructions Given:  Yes.    Post Pull Pulses Present:  Yes.    Dressing Applied:  Yes.    Comments:  Tolerated procedure well

## 2015-01-22 NOTE — CV Procedure (Signed)
PROCEDURE:  Left heart catheterization with selective coronary angiography, CTO PCI mid circumflex.  INDICATIONS:  Persistent angina  The risks, benefits, and details of the procedure were explained to the patient.  The patient verbalized understanding and wanted to proceed.  Informed written consent was obtained.  PROCEDURE TECHNIQUE:  After Xylocaine anesthesia a 48F sheath was placed in the right femoral artery with a single anterior needle wall stick.   Left coronary angiography was done using a Judkins L4 guide catheter.  The sheath was changed out for an 8 [place] sheath. IV heparin was given. ACT was used to check that the heparin was therapeutic. The intervention was performed. Please see below for details. Left heart catheterization was done using a pigtail catheter.    CONTRAST:  Total of 135 cc.  COMPLICATIONS:  None.    HEMODYNAMICS:  Aortic pressure was 147/51; LV pressure was 147/5; LVEDP 16.  There was no gradient between the left ventricle and aorta.    ANGIOGRAPHIC DATA:   The left main coronary artery is a large vessel with mild ostial disease.  The left anterior descending artery is a large vessel which wraps around the apex. In the mid vessel, there is moderate disease, up to 50%. There is a small first diagonal which is patent. The second significant diagonal is large and patent. The apical LAD wraps around the apex. There are brisk collaterals from the septals in the apical LAD to the distal RCA system.  The left circumflex artery is a diffusely diseased vessel proximally. In the mid vessel, there is an occlusion. There are left to left collaterals which feed a large obtuse marginal branch and the true AV groove circumflex retrograde.  The right coronary artery is known to be occluded from prior catheterization. The distal to mid vessel is seen through collaterals. The distal RCA appears patent. The PDA fills from an epicardial collateral from the distal LAD. The  posterior lateral artery gets collateral filling also from the circumflex system. Collaterals extend up to the mid RCA.  LEFT VENTRICULOGRAM:  Left ventricular angiogram was not done.  LVEDP was 16 mmHg.  PCI NARRATIVE: An 8 French left backup guide catheter was used to engage the left main. Initially a fielder XT wire along with a cross boss was advanced to the left main. Due to tortuosity in the ostial circumflex, there was difficulty in getting the wire into the circumflex to the occlusion. Eventually, the fielder wire got to the occlusion but would not cross. The cross boss was advanced for backup, but would not make a turn to go to the proximal calf. At that point, we switched out wires to Merkel 3 g wire. The Merkel 3 g wire did advance. The cross boss was advanced. Further angiography revealed that the miracle 3 and cross loss were subintimal, likely at the bend that the cross boss initially would not navigate. We then redirected a pro-water wire into the true lumen to the proximal cap. Cross loss was removed and an over-the-wire balloon was advanced to the proximal cap. We switched out again for the miracle 3 g wire. This wire did advance but did not go freely into the distal vessel. A PT2 wire was then advanced and appeared to be subintimal. Finally, the PT 2 wire did select a branch, indicating that we were in true lumen for some portion of the obtuse marginal branch. More distally however, it appeared we were subintimal. We tried achieving true lumen more distally by  rewiring from the occlusion with as well as the PT2.  We're unsuccessful. At that point, we ballooned the entire short occlusion with a 1.5 x 12 balloon with multiple inflations. Final angiography revealed antegrade flow into the true AV groove circumflex. There was residual dissection and likely intramural hematoma into the OM 2.  There is no evidence of perforation. The patient's blood pressure and heart rate were stable. She had no  chest discomfort throughout the procedure. She tolerated it quite well.  IMPRESSIONS:  1. Mild ostial disease in the  left main coronary artery. 2. Moderate disease in the mid  left anterior descending artery.  The LAD collateralizes both the distal circumflex and large OM1 along with the RCA. 3. Diffusely diseased proximal left circumflex artery.  The mid circumflex is occluded. There are left to left collaterals which reconstitute the OM 1 and the distal circumflex.  This chronic total occlusion was treated with balloon angioplasty as noted above. Although flow was not restored to the OM branch, there was now antegrade flow into the AV groove circumflex. The result was not sufficient to allow for stenting. 4. Chronically occluded right coronary artery. 5. Left ventricular systolic function not assessed.  LVEDP 16 mmHg.  RECOMMENDATION:  Clopidogrel was added. Continue aggressive medical therapy. The patient may have some relief with now antegrade flow into her AV groove circumflex and [place] improved collaterals to the RCA. If she continues to have anginal symptoms, could consider bringing her back in a couple of months to see if there is now a channel to allow for easier intervention.

## 2015-01-22 NOTE — Interval H&P Note (Signed)
Cath Lab Visit (complete for each Cath Lab visit)  Clinical Evaluation Leading to the Procedure:   ACS: No.  Non-ACS:    Anginal Classification: CCS III  Anti-ischemic medical therapy: Maximal Therapy (2 or more classes of medications)  Non-Invasive Test Results: No non-invasive testing performed  Prior CABG: No previous CABG  Ischemic Symptoms? CCS III (Marked limitation of ordinary activity) Anti-ischemic Medical Therapy? Maximal Medical Therapy (2 or more classes of medications) Non-invasive Test Results? No non-invasive testing performed Prior CABG? No Previous CABG   Patient Information:   1-2V CAD, no prox LAD  A (7)  Indication: 20; Score: 7   Patient Information:   1-2V-CAD with DS 50-60% With No FFR, No IVUS  I (3)  Indication: 21; Score: 3   Patient Information:   1-2V-CAD with DS 50-60% With FFR  A (7)  Indication: 22; Score: 7   Patient Information:   1-2V-CAD with DS 50-60% With FFR>0.8, IVUS not significant  I (2)  Indication: 23; Score: 2   Patient Information:   3V-CAD without LMCA With Abnormal LV systolic function  A (9)  Indication: 48; Score: 9   Patient Information:   LMCA-CAD  A (9)  Indication: 49; Score: 9   Patient Information:   2V-CAD with prox LAD PCI  A (7)  Indication: 62; Score: 7   Patient Information:   2V-CAD with prox LAD CABG  A (8)  Indication: 62; Score: 8   Patient Information:   3V-CAD without LMCA With Low CAD burden(i.e., 3 focal stenoses, low SYNTAX score) PCI  A (7)  Indication: 63; Score: 7   Patient Information:   3V-CAD without LMCA With Low CAD burden(i.e., 3 focal stenoses, low SYNTAX score) CABG  A (9)  Indication: 63; Score: 9   Patient Information:   3V-CAD without LMCA E06c - Intermediate-high CAD burden (i.e., multiple diffuse lesions, presence of CTO, or high SYNTAX score) PCI  U (4)  Indication: 64; Score: 4   Patient Information:   3V-CAD without  LMCA E06c - Intermediate-high CAD burden (i.e., multiple diffuse lesions, presence of CTO, or high SYNTAX score) CABG  A (9)  Indication: 64; Score: 9   Patient Information:   LMCA-CAD With Isolated LMCA stenosis  PCI  U (6)  Indication: 65; Score: 6   Patient Information:   LMCA-CAD With Isolated LMCA stenosis  CABG  A (9)  Indication: 65; Score: 9   Patient Information:   LMCA-CAD Additional CAD, low CAD burden (i.e., 1- to 2-vessel additional involvement, low SYNTAX score) PCI  U (5)  Indication: 66; Score: 5   Patient Information:   LMCA-CAD Additional CAD, low CAD burden (i.e., 1- to 2-vessel additional involvement, low SYNTAX score) CABG  A (9)  Indication: 66; Score: 9   Patient Information:   LMCA-CAD Additional CAD, intermediate-high CAD burden (i.e., 3-vessel involvement, presence of CTO, or high SYNTAX score) PCI  I (3)  Indication: 67; Score: 3   Patient Information:   LMCA-CAD Additional CAD, intermediate-high CAD burden (i.e., 3-vessel involvement, presence of CTO, or high SYNTAX score) CABG  A (9)  Indication: 67; Score: 9     History and Physical Interval Note:  01/22/2015 9:47 AM  Melissa Holland  has presented today for surgery, with the diagnosis of CTO  The various methods of treatment have been discussed with the patient and family. After consideration of risks, benefits and other options for treatment, the patient has consented to  Procedure(s): PERCUTANEOUS CORONARY STENT  INTERVENTION (PCI-S) (N/A) as a surgical intervention .  The patient's history has been reviewed, patient examined, no change in status, stable for surgery.  I have reviewed the patient's chart and labs.  Questions were answered to the patient's satisfaction.     I had a long talk with the patient and her son this morning. Her son was not present when I met the patient in the office a few weeks ago. We talked about what the patient's wishes would be if  she in fact had 3 vessel coronary artery disease. She is very reluctant to consider open heart surgery. She feels that she is quite weak in general. She is not able to walk much due to lower extremity peripheral vascular disease. She has been losing weight of late as well. She states she has even lost weight in the last few weeks. She has been staying in an assisted living facility.  She is agreeable to taking long-term dual antiplatelet therapy with aspirin and Plavix. I stressed the importance of this if in fact we are able to place a stent in one of her lesions. Her desire is to stay alive so that she can establish a relationship with her only living son. She explained to me that her other son had passed away. She has been estranged from her son that is alive for quite some time. Since I saw her in the office, they had a long talk per her report and they are working on reconciling. She just wants to spend some more time with him. In addition, we spoke about her vascular surgery issues. She does not want to undergo a lower extremity bypass surgery with a goal of being able to walk longer distances. She states that she was told that lower extremity bypass would not help with her cramping and generalized pain, so she is not interested.  After our discussion, we agreed that we would try to fix anything we could percutaneously that we felt would improve the patient's symptoms. I agree that she would be a poor candidate for coronary artery bypass surgery given her overall poor functional status and apparent poor nutritional status with ongoing weight loss.  Our hope is that her coronary vasculature has not changed since 2014, when she was last cathed.  If that is the case, we will plan on attempting CTO PCI of the left circumflex. We will try to preferentially stay into the large obtuse marginal branch if dissection reentry is required.  Of note, I reviewed her lower extremity angiogram from 2015. Both common  femoral arteries and iliac systems appear to be patent. Both sides should be available if we require bilateral access. She does have severe SFA and tibial vessel disease. We will plan on starting with just right groin access to assess her coronary anatomy. We would decide on a second access site at that point.  All questions were answered. The risks and benefits of the procedure have been described to the patient.  I will speak with her son after the procedure.   Zaidan Keeble S.

## 2015-01-23 ENCOUNTER — Telehealth: Payer: Self-pay | Admitting: Interventional Cardiology

## 2015-01-23 DIAGNOSIS — I2582 Chronic total occlusion of coronary artery: Secondary | ICD-10-CM | POA: Diagnosis not present

## 2015-01-23 DIAGNOSIS — I739 Peripheral vascular disease, unspecified: Secondary | ICD-10-CM

## 2015-01-23 DIAGNOSIS — I200 Unstable angina: Secondary | ICD-10-CM

## 2015-01-23 DIAGNOSIS — E785 Hyperlipidemia, unspecified: Secondary | ICD-10-CM | POA: Diagnosis not present

## 2015-01-23 DIAGNOSIS — E119 Type 2 diabetes mellitus without complications: Secondary | ICD-10-CM | POA: Diagnosis not present

## 2015-01-23 DIAGNOSIS — I25709 Atherosclerosis of coronary artery bypass graft(s), unspecified, with unspecified angina pectoris: Secondary | ICD-10-CM

## 2015-01-23 DIAGNOSIS — I25110 Atherosclerotic heart disease of native coronary artery with unstable angina pectoris: Secondary | ICD-10-CM | POA: Diagnosis not present

## 2015-01-23 DIAGNOSIS — I2510 Atherosclerotic heart disease of native coronary artery without angina pectoris: Secondary | ICD-10-CM

## 2015-01-23 DIAGNOSIS — Z720 Tobacco use: Secondary | ICD-10-CM

## 2015-01-23 LAB — BASIC METABOLIC PANEL
Anion gap: 7 (ref 5–15)
BUN: 12 mg/dL (ref 6–23)
CO2: 26 mmol/L (ref 19–32)
Calcium: 8.7 mg/dL (ref 8.4–10.5)
Chloride: 102 mmol/L (ref 96–112)
Creatinine, Ser: 0.73 mg/dL (ref 0.50–1.10)
GFR calc non Af Amer: 84 mL/min — ABNORMAL LOW (ref >90)
GLUCOSE: 123 mg/dL — AB (ref 70–99)
Potassium: 4.2 mmol/L (ref 3.5–5.1)
Sodium: 135 mmol/L (ref 135–145)

## 2015-01-23 LAB — CBC
HEMATOCRIT: 33.3 % — AB (ref 36.0–46.0)
HEMOGLOBIN: 11 g/dL — AB (ref 12.0–15.0)
MCH: 27 pg (ref 26.0–34.0)
MCHC: 33 g/dL (ref 30.0–36.0)
MCV: 81.8 fL (ref 78.0–100.0)
Platelets: 175 10*3/uL (ref 150–400)
RBC: 4.07 MIL/uL (ref 3.87–5.11)
RDW: 13 % (ref 11.5–15.5)
WBC: 5.3 10*3/uL (ref 4.0–10.5)

## 2015-01-23 LAB — GLUCOSE, CAPILLARY: Glucose-Capillary: 116 mg/dL — ABNORMAL HIGH (ref 70–99)

## 2015-01-23 MED ORDER — CLOPIDOGREL BISULFATE 75 MG PO TABS: 75.0000 mg | ORAL_TABLET | Freq: Every day | ORAL | Status: DC

## 2015-01-23 NOTE — Clinical Social Work Psychosocial (Signed)
Clinical Social Work Department BRIEF PSYCHOSOCIAL ASSESSMENT 01/23/2015  Patient:  Romelle StarcherURMAN,Akyra P     Account Number:  [id number]     Admit date:  01/22/2015  Clinical Social Worker:  Arrie AranGIVENS,Joren Rehm,  Date/Time:  01/23/2015 10:17 AM  Referred by:  Physician  Date Referred:  01/23/2015 Referred for  ALF Placement   Other Referral:   Interview type:  Patient Other interview type:    PSYCHOSOCIAL DATA Living Status:  FACILITY Admitted from facility:  Other Level of care:  Assisted Living Primary support name:   Primary support relationship to patient:   Degree of support available:    CURRENT CONCERNS  Other Concerns:    SOCIAL WORK ASSESSMENT / PLAN CSW-Intern spoke with Ms.Mckim at bedside to confirm her return to [facility] in [place]. Ms.Turnman confirmed she will return to [facility] at discharge and was able to provide an address of the facility to CSW-Intern. She also expressed her goal of eventually moving into the independent part of the facility, once her leg weakness is resolved.   Assessment/plan status:  No Further Intervention Required Other assessment/ plan:   Information/referral to community resources:   None needed or requested at this time.    PATIENT'S/FAMILY'S RESPONSE TO PLAN OF CARE: Ms.Kirt was alert and open to speaking with CSW-Intern.

## 2015-01-23 NOTE — Clinical Social Work Note (Signed)
CSW-Intern contacted [facility] in [place] by phone [phone]) and learned the facility is an independent living retirement community. Melissa Holland will be transported to [facility] by her son, Melissa Holland for discharge today. CSW and CSW-Intern signing off.     Arrie AranZoevenia Jackson Coffield CSW-Intern [phone]

## 2015-01-23 NOTE — Progress Notes (Signed)
CARDIAC REHAB PHASE I   PRE:  Rate/Rhythm: 66 SR  BP:  Supine:   Sitting:   Standing: 136/55   SaO2:   MODE:  Ambulation: 300 ft   POST:  Rate/Rhythm: 72 SR  BP:  Supine:   Sitting: 161/59  Standing:    SaO2:  0820-0910 Pt bent over and touched toes when I entered room. Reminded pt not to bend over with recent groin puncture. Very flexible for her age. Pt walked 300 ft holding to side rail . Stated left leg tired after walk but no CP. Pt very talkative and seems to adhere to good diabetic diet, knew how to take NTG correctly. Discussed importance of plavix with stents. Gave smoking cessation handout and pt stated she was down to about 4 a day. She stated she has gone months without smoking. Offered CRP 2 but pt declined. Gave ex ed encouraging as much walking as she can do. Pt stated she would like to eventually move out of Assisted Living and out on her own again.   Luetta Nuttingharlene Leeum Sankey, RN BSN  01/23/2015 9:06 AM

## 2015-01-23 NOTE — Telephone Encounter (Signed)
I spoke with the patient. I advised her the hospital had sent a 90-day RX for plavix to [facility]. She was aware of this and changed it to # 30. She needs a written RX for 90-day supply mailed to her so [facility]. I have advised her we will have Dr. Eldridge DaceVaranasi sign a RX and mail it to her. Address confirmed.

## 2015-01-23 NOTE — Progress Notes (Signed)
Patient Name: Melissa Holland Date of Encounter: 01/23/2015     Active Problems:   Accelerating angina   Coronary arteriosclerosis   Angina pectoris    SUBJECTIVE  No CP or SOB. Tearful this morning, want to go home, complain of her meal being cold and lack of whole wheat bread  CURRENT MEDS . amLODipine  5 mg Oral Daily  . aspirin  81 mg Oral Daily  . clopidogrel  75 mg Oral Q breakfast  . feeding supplement (ENSURE COMPLETE)  237 mL Oral BID BM  . heparin  5,000 Units Subcutaneous 3 times per day  . metoprolol succinate  12.5 mg Oral Daily  . potassium chloride  10 mEq Oral Daily  . simvastatin  40 mg Oral QPM    OBJECTIVE  Filed Vitals:   01/23/15 0028 01/23/15 0100 01/23/15 0200 01/23/15 0300  BP: 112/41 128/43 135/51 159/54  Pulse: 46 50 50 67  Temp:    98.4 F (36.9 C)  TempSrc:    Oral  Resp: 15   18  Height:      Weight: 112 lb 7 oz (51 kg)     SpO2: 92% 94% 95% 97%    Intake/Output Summary (Last 24 hours) at 01/23/15 0722 Last data filed at 01/23/15 0311  Gross per 24 hour  Intake    240 ml  Output   2200 ml  Net  -1960 ml   Filed Weights   01/22/15 0816 01/23/15 0028  Weight: 113 lb (51.256 kg) 112 lb 7 oz (51 kg)    PHYSICAL EXAM  General: Pleasant, NAD. Neuro: Alert and oriented X 3. Moves all extremities spontaneously. Psych: Normal affect. HEENT:  Normal  Neck: Supple without bruits or JVD. Lungs:  Resp regular and unlabored, CTA. Heart: RRR no s3, s4, or murmurs. Abdomen: Soft, non-tender, non-distended, BS + x 4. R groin cath stable stable, small hematoma Extremities: No clubbing, cyanosis or edema. DP/PT/Radials 2+ and equal bilaterally.  Accessory Clinical Findings  CBC  Recent Labs  01/22/15 1515 01/23/15 0234  WBC 4.0 5.3  HGB 12.2 11.0*  HCT 36.1 33.3*  MCV 82.4 81.8  PLT 158 175   Basic Metabolic Panel  Recent Labs  01/22/15 1515 01/23/15 0234  NA  --  135  K  --  4.2  CL  --  102  CO2  --  26    GLUCOSE  --  123*  BUN  --  12  CREATININE 0.62 0.73  CALCIUM  --  8.7    TELE Sinus brady with HR 40-50s    ECG  Sinus brady with RBBB  Echocardiogram 10/31/2013  Comparison to prior study December 2012. Normal LV wall thickness with LVEF 50-55%, wall motion abnormalities as outlined consistent with ischemic heart disease. Grade 1 diastolic dysfunction with increased filling pressures. Since prior study wall motion abnormalities are new and filling pressures have increased. Mild left atrial enlargement. Sclerotic aortic valve. MAC with mildly restricted posterior leaflet and mild mitral regurgitation. Mild tricuspid regurgitation with PASP 36 mmHg.    Radiology/Studies  No results found.  ASSESSMENT AND PLAN  1. Chest pain  - cath 01/22/2015 chronically occluded RCA, LVEDP , balloon angioplasty to occluded mid LCx   - continue ASA, plavix, amlodipine, statin, BB. She does have bradycardia overnight, however no significant dizziness.   2. CAD 3. DM 4. HLD 5. Tobacco abuse    Signed, Azalee Course PA-C Pager: [phone]    I have examined the  patient and reviewed assessment and plan and discussed with patient.  Agree with above as stated.  Right groin stable.  Walk with rehab.  Will see if her sx are better.  If recurrent angina, could bring her back to consider PCI of the OM1 or potentially the RCA, although that vessel has been occluded for an even longer time.    Aydia Maj S.

## 2015-01-23 NOTE — Discharge Instructions (Signed)
Angina Pectoris Angina pectoris is extreme discomfort in your chest, neck, or arm. Your doctor may call it just angina. It is caused by a lack of oxygen to your heart wall. It may feel like tightness or heavy pressure. It may feel like a crushing or squeezing pain. Some people say it feels like gas. It may go down your shoulders, back, and arms. Some people have symptoms other than pain. These include:  Tiredness.  Shortness of breath.  Cold sweats.  Feeling sick to your stomach (nausea). There are four types of angina:  Stable angina. This type often lasts the same amount of time each time it happens. Activity, stress, or excitement can bring it on. It often gets better after taking a medicine called nitroglycerin. This goes under your tongue.  Unstable angina. This type can happen when you are not active or even during sleep. It can suddenly get worse or happen more often. It may not get better after taking the special medicine. It can last up to 30 minutes.  Microvascular angina. This type is more common in women. It may be more severe or last longer than other types.  Prinzmetal angina. This type often happens when you are not active or in the early morning hours. HOME CARE   Only take medicines as told by your doctor.  Stay active or exercise more as told by your doctor.  Limit very hard activity as told by your doctor.  Limit heavy lifting as told by your doctor.  Keep a healthy weight.  Learn about and eat foods that are healthy for your heart.  Do not use any tobacco such as cigarettes, chewing tobacco, or e-cigarettes. GET HELP RIGHT AWAY IF:   You have chest, neck, deep shoulder, or arm pain or discomfort that lasts more than a few minutes.  You have chest, neck, deep shoulder, or arm pain or discomfort that goes away and comes back over and over again.  You have heavy sweating that seems to happen for no reason.  You have shortness of breath or trouble  breathing.  Your angina does not get better after a few minutes of rest.  Your angina does not get better after you take nitroglycerin medicine. These can all be symptoms of a heart attack. Get help right away. Call your local emergency service (911 in [place] Do not  drive yourself to the hospital. Do not  wait to for your symptoms to go away. MAKE SURE YOU:   Understand these instructions.  Will watch your condition.  Will get help right away if you are not doing well or get worse. Document Released: 05/03/2008 Document Revised: 11/20/2013 Document Reviewed: 03/19/2014 [facility] Patient Information 2015 [employer] [place]. This information is not intended to replace advice given to you by your health care provider. Make sure you discuss any questions you have with your health care provider.  No driving until seen by cardiologist. No lifting over 5 lbs for 1 week. No sexual activity for 1 week. Keep procedure site clean & dry. If you notice increased pain, swelling, bleeding or pus, call/return!  You may shower, but no soaking baths/hot tubs/pools for 1 week.   Hold Metformin for 48 hours after cath

## 2015-01-23 NOTE — Telephone Encounter (Signed)
New Msg    Pt c/o medication issue: 1. Name of Medication:  Clopidogrel 2. How are you currently taking this medication (dosage and times per day)?75mg   1 tablet daily 3. Are you having a reaction (difficulty breathing--STAT)? no 4. What is your medication issue? Pt needs 30 day supply and 1 refill [facility]  Pt also needs 90 [facility] [place], [place]  [facility] for [facility] is   [facility]  [place] [address] [place] [id number]  PT insist on nurse call back. Please call as well.

## 2015-01-23 NOTE — Discharge Summary (Signed)
Discharge Summary   Patient ID: Melissa Holland,  MRN: [id number], DOB/AGE: [birth year] 71 y.o.  Admit date: 01/22/2015 Discharge date: 01/23/2015  Primary Care Provider: Fredirick Maudlin Primary Cardiologist: Dr. Christa See ([facility])  Discharge Diagnoses Principal Problem:   Accelerating angina Active Problems:   Diabetes   CAD (coronary artery disease) of artery bypass graft   Hypercholesterolemia   Tobacco abuse   GERD (gastroesophageal reflux disease)   PVD (peripheral vascular disease)   Coronary arteriosclerosis   Allergies Allergies  Allergen Reactions  . Penicillins Anaphylaxis  . Sulfonamide Derivatives Anaphylaxis  . Alupent [Metaproterenol Sulfate] Other (See Comments)    Makes pt "nervous"  . Azithromycin Other (See Comments)    unknown  . Linzess [Linaclotide] Nausea And Vomiting  . Omnipaque [Iohexol] Other (See Comments)    Pt states can take mix Dye and Metformin together.  Doesn't have SOB or rash  . Pantoprazole Nausea And Vomiting  . Paroxetine Other (See Comments)    unknown  . Varenicline Tartrate Other (See Comments)    unknown  . Benadryl [Diphenhydramine Hcl] Other (See Comments)    Can't breath  . Hydrocortisone Hives  . Prednisone Hives    Procedures  Cardiac catheterization 01/22/2015  IMPRESSIONS:  1. Mild ostial disease in the left main coronary artery. 2. Moderate disease in the mid left anterior descending artery. The LAD collateralizes both the distal circumflex and large OM1 along with the RCA. 3. Diffusely diseased proximal left circumflex artery. The mid circumflex is occluded. There are left to left collaterals which reconstitute the OM 1 and the distal circumflex. This chronic total occlusion was treated with balloon angioplasty as noted above. Although flow was not restored to the OM branch, there was now antegrade flow into the AV groove circumflex. The result was not sufficient to allow for  stenting. 4. Chronically occluded right coronary artery. 5. Left ventricular systolic function not assessed. LVEDP 16 mmHg.  RECOMMENDATION: Clopidogrel was added. Continue aggressive medical therapy. The patient may have some relief with now antegrade flow into her AV groove circumflex and [place] improved collaterals to the RCA. If she continues to have anginal symptoms, could consider bringing her back in a couple of months to see if there is now a channel to allow for easier intervention.     Hospital Course  The patient is a 69 year old Caucasian female with past medical history of coronary artery disease. In 2008, she was diagnosed with occluded RCA and was medically managed. She underwent cardiac catheterization in 2014 and found to have occluded mid left circumflex with occluded mid RCA. She had moderate disease in LAD at the time. Patient has been having exertional discomfort for the last several month. After reviewing her cath films from 2008 and 2014, it was felt that her symptom may be related to the left circumflex CTO. After discussing with patient and her family regarding possible treatment options, it was decided for her to undergo cardiac catheterization on 01/22/2015 for CTO procedure on her left circumflex. The risk and benefit of cardiac catheterization was explained to the patient and she was willing to proceed.  She presented on 2/24 for the scheduled CTO procedure. Cardiac catheterization showed mild ostial disease in left main, LAD or disease in mid LAD, LAD collateralized both the distal left circumflex and the large OM 1 along with RCA. Patient had diffuse diseased proximal circumflex artery with mid left circumflex occlusion. There was left to left collaterals which reconstitute OM1 and distal left circumflex. This  total chronic occlusion was treated with balloon angioplasty. Postprocedure, there is now antegrade flow into the AV groove circumflex circumflex which may allow  improved collaterals to RCA, the result was not sufficient to allow stenting. Plavix was added to her medical regimen. She will need aggressive medical therapy. If she continues to have anginal symptoms, we could consider bring her back in several month to see if there is a channel to allow easier intervention.  She was seen ine morning of 01/23/2015, at which time she denies any significant chest discomfort or shortness of breath. She is deemed stable for discharge from cardiology perspective. I will arrange follow-up with Dr. Wyline Mood in 2-4 weeks (Contacted the [place] office, PC system down, left her name/birthday and medical record number with schedule who will contact patient to arrange followup once system back up).   Discharge Vitals Blood pressure 159/54, pulse 67, temperature 98.4 F (36.9 C), temperature source Oral, resp. rate 18, height  (1.6 m), weight 112 lb 7 oz (51 kg), SpO2 97 %.  Filed Weights   01/22/15 0816 01/23/15 0028  Weight: 113 lb (51.256 kg) 112 lb 7 oz (51 kg)    Labs  CBC  Recent Labs  01/22/15 1515 01/23/15 0234  WBC 4.0 5.3  HGB 12.2 11.0*  HCT 36.1 33.3*  MCV 82.4 81.8  PLT 158 175   Basic Metabolic Panel  Recent Labs  01/22/15 1515 01/23/15 0234  NA  --  135  K  --  4.2  CL  --  102  CO2  --  26  GLUCOSE  --  123*  BUN  --  12  CREATININE 0.62 0.73  CALCIUM  --  8.7    Disposition  Pt is being discharged home today in good condition.  Follow-up Plans & Appointments      Follow-up Information    Follow up with Antoine Poche, MD.   Specialty:  Cardiology   Why:  Office will contact you to arrange followup.    Contact information:   [address] [place] [address] [phone]       Follow up with Fredirick Maudlin, MD.   Specialty:  Pulmonary Disease   Why:  Please arrange followup with your primary care doctor regarding cough   Contact information:   [address] [place]  [address] [phone]       Discharge Medications    Medication List    TAKE these medications        ALPRAZolam 1 MG tablet  Commonly known as:  XANAX  Take 0.5-1 mg by mouth 4 (four) times daily as needed for anxiety. For nerves     amLODipine 5 MG tablet  Commonly known as:  NORVASC  Take 1 tablet (5 mg total) by mouth daily.     aspirin 81 MG tablet  Take 81 mg by mouth daily.     clopidogrel 75 MG tablet  Commonly known as:  PLAVIX  Take 1 tablet (75 mg total) by mouth daily with breakfast.     ENDOCET 10-325 MG per tablet  Generic drug:  oxyCODONE-acetaminophen  Take 0.5-1 tablets by mouth every 4 (four) hours as needed for pain.     EYE DROPS ADVANCED RELIEF OP  Apply 1 drop to eye daily as needed (dry eyes).     furosemide 20 MG tablet  Commonly known as:  LASIX  Take 20 mg by mouth daily as needed for fluid.  guaifenesin 100 MG/5ML syrup  Commonly known as:  ROBITUSSIN  Take 200 mg by mouth 3 (three) times daily as needed for cough.     metFORMIN 500 MG tablet  Commonly known as:  GLUCOPHAGE  Take 500 mg by mouth 2 (two) times daily.     metoprolol succinate 25 MG 24 hr tablet  Commonly known as:  TOPROL-XL  Take 12.5 mg by mouth every morning.     nitroGLYCERIN 0.4 MG SL tablet  Commonly known as:  NITROSTAT  Place 1 tablet (0.4 mg total) under the tongue every 5 (five) minutes as needed. For chest pain.     potassium chloride 10 MEQ CR tablet  Commonly known as:  KLOR-CON  Take 10 mEq by mouth as needed. When taking Furosemide     simvastatin 40 MG tablet  Commonly known as:  ZOCOR  Take 1 tablet (40 mg total) by mouth every evening.     VENTOLIN HFA 108 (90 BASE) MCG/ACT inhaler  Generic drug:  albuterol  Inhale 2 puffs into the lungs every 6 (six) hours as needed for wheezing or shortness of breath.     albuterol (2.5 MG/3ML) 0.083% NEBU 3 mL, albuterol (5 MG/ML) 0.5% NEBU 0.5 mL  Inhale 2.5 mg into the lungs every 6 (six) hours as  needed (congestion).        Duration of Discharge Encounter   Greater than 30 minutes including physician time.  Ramond DialSigned, Meng, Hao PA-C Pager: [phone] 01/23/2015, 8:41 AM    I have examined the patient and reviewed assessment and plan and discussed with patient.  Agree with above as stated.  Right groin stable.  Walk with rehab.  S/p PTCA to circumflex.  Restored antegrade flow to the AV groove circ, but could not restore normal flow into the OM1.  Will see if her sx are better.  If recurrent angina, could bring her back to consider PCI of the OM1 or potentially the RCA, although that vessel has been occluded for an even longer time.    Harun Brumley S.

## 2015-01-24 MED ORDER — CLOPIDOGREL BISULFATE 75 MG PO TABS: 75.0000 mg | ORAL_TABLET | Freq: Every day | ORAL | Status: DC

## 2015-01-29 ENCOUNTER — Encounter: Payer: Self-pay | Admitting: Physician Assistant

## 2015-01-30 ENCOUNTER — Encounter: Payer: Self-pay | Admitting: Gastroenterology

## 2015-01-30 ENCOUNTER — Ambulatory Visit (INDEPENDENT_AMBULATORY_CARE_PROVIDER_SITE_OTHER): Payer: Medicare Other | Admitting: Gastroenterology

## 2015-01-30 VITALS — BP 115/57 | HR 62 | Temp 97.0°F | Ht 63.0 in | Wt 113.2 lb

## 2015-01-30 DIAGNOSIS — R1031 Right lower quadrant pain: Secondary | ICD-10-CM

## 2015-01-30 DIAGNOSIS — Q453 Other congenital malformations of pancreas and pancreatic duct: Secondary | ICD-10-CM

## 2015-01-30 DIAGNOSIS — R1314 Dysphagia, pharyngoesophageal phase: Secondary | ICD-10-CM

## 2015-01-30 DIAGNOSIS — I2510 Atherosclerotic heart disease of native coronary artery without angina pectoris: Secondary | ICD-10-CM

## 2015-01-30 DIAGNOSIS — K868 Other specified diseases of pancreas: Secondary | ICD-10-CM

## 2015-01-30 DIAGNOSIS — R1310 Dysphagia, unspecified: Secondary | ICD-10-CM

## 2015-01-30 DIAGNOSIS — R1319 Other dysphagia: Secondary | ICD-10-CM

## 2015-01-30 DIAGNOSIS — K5900 Constipation, unspecified: Secondary | ICD-10-CM

## 2015-01-30 NOTE — Assessment & Plan Note (Signed)
Reports ongoing esophageal dysphagia to liquids, solids, medication. Short-lived improvement with previous esophageal dilation. Offered further evaluation via barium pill esophagram which could shed some light on possible utility issues but would also determine if there was any evidence of stricture or Zenker's diverticulum. At this time patient is not interested in pursuing further evaluation but will let me know if she changes her mind.

## 2015-01-30 NOTE — Assessment & Plan Note (Signed)
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 Stable dating back to at least 2008, likely insignificant.

## 2015-01-30 NOTE — Patient Instructions (Signed)
1. If you decide you want to pursue an xray of your esophagus to evaluate your swallowing, please call the office and schedule. 2. I will investigate whether Movantik is an option for treatment of your constipation. We will be in touch in the near future.

## 2015-01-30 NOTE — Assessment & Plan Note (Signed)
Constipation in the setting of oxycodone, difficult to manage. Patient reports side effect or refuses to take MiraLAX, Amitiza, Linzess. Currently takes Dulcolax for every other day with 1 bowel movement weekly. Suspect abdominal discomfort related to constipation. She may be a candidate for Movantik. She will consider if covered by insurance and not contraindicated. We will look into this further for her. For now she'll continue her current bowel regimen.

## 2015-01-30 NOTE — Progress Notes (Signed)
Primary Care Physician: Fredirick Maudlin, MD  Primary Gastroenterologist:  Roetta Sessions, MD   Chief Complaint  Patient presents with  . Follow-up    HPI: Melissa Holland is a 69 y.o. female here for follow-up. She was last seen in June 2015. History of constipation, abdominal pain, dysphagia. 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 Followed by Dr. 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 Patient has been very difficult with regards to management of her constipation because she reports either a side effect of nausea and vomiting or refuses to take MiraLAX, Amitiza, Linzess. Did not want to try lactulose. Cannot do any liquid medications.   Currently patient is taking Dulcolax 4 tablets every other day. She has a bowel movement about once per week. Complains of feeling a sweet potato in her RLQ and discomfort in her LUQ. No nausea or vomiting. Denies heartburn. Takes TUMS prn. Still has problems with pills feeling like they stick in her throat especially if cut in half. Problems with solids/liquids too. Short-lived improvement with esophageal dilation. Weight is up 8 pounds since June.  Recent hospitalization for cardiac catheterization.  Info for 90 day RX [place] [phone]   Current Outpatient Prescriptions  Medication Sig Dispense Refill  . albuterol (2.5 MG/3ML) 0.083% NEBU 3 mL, albuterol (5 MG/ML) 0.5% NEBU 0.5 mL Inhale 2.5 mg into the lungs every 6 (six) hours as needed (congestion).     Marland Kitchen albuterol (VENTOLIN HFA) 108 (90 BASE) MCG/ACT inhaler Inhale 2 puffs into the lungs every 6 (six) hours as needed for wheezing or shortness of breath.     . ALPRAZolam (XANAX) 1 MG tablet Take 0.5-1 mg  by mouth 4 (four) times daily as needed for anxiety. For nerves    . amLODipine (NORVASC) 5 MG tablet Take 1 tablet (5 mg total) by mouth daily. 90 tablet 3  . aspirin 81 MG tablet Take 81 mg by mouth daily.      . clopidogrel (PLAVIX) 75 MG tablet Take 1 tablet (75 mg total) by mouth daily with breakfast. 90 tablet 3  . furosemide (LASIX) 20 MG tablet Take 20 mg by mouth daily as needed for fluid.     Marland Kitchen guaifenesin (ROBITUSSIN) 100 MG/5ML syrup Take 200 mg by mouth 3 (three) times daily as needed for cough.    . metFORMIN (GLUCOPHAGE) 500 MG tablet Take 500 mg by mouth 2 (two) times daily.     . metoprolol succinate (TOPROL-XL) 25 MG 24 hr tablet Take 12.5 mg by mouth every morning.     . nitroGLYCERIN (NITROSTAT) 0.4 MG SL tablet Place 1 tablet (0.4 mg total) under the tongue every 5 (five) minutes as needed. For chest pain. 25 tablet 3  . oxyCODONE-acetaminophen (ENDOCET) 10-325 MG per tablet Take 0.5-1 tablets by mouth every 4 (four) hours as needed for pain.     . potassium chloride (KLOR-CON) 10 MEQ CR tablet Take 10 mEq by mouth as needed. When taking Furosemide    . simvastatin (ZOCOR) 40 MG tablet Take 1 tablet (40 mg total) by mouth every evening. 90 tablet 3  . Tetrahydroz-Dextran-PEG-Povid (EYE DROPS ADVANCED RELIEF OP) Apply 1 drop to eye daily as needed (dry eyes).     No current  facility-administered medications for this visit.    Allergies as of 01/30/2015 - Review Complete 01/30/2015  Allergen Reaction Noted  . Penicillins Anaphylaxis   . Sulfonamide derivatives Anaphylaxis   . Alupent [metaproterenol sulfate] Other (See Comments) 09/10/2011  . Azithromycin Other (See Comments)   . Linzess [linaclotide] Nausea And Vomiting 05/06/2014  . Omnipaque [iohexol] Other (See Comments) 09/10/2011  . Pantoprazole Nausea And Vomiting 07/19/2014  . Paroxetine Other (See Comments) 04/09/2010  . Varenicline tartrate Other (See Comments) 04/09/2010  . Benadryl [diphenhydramine hcl]  Other (See Comments) 03/20/2012  . Hydrocortisone Hives 03/20/2012  . Prednisone Hives 03/20/2012    ROS:  General: Negative for anorexia, weight loss, fever, chills, fatigue, weakness. ENT: Negative for hoarseness, difficulty swallowing , nasal congestion. CV: Negative for chest pain, angina, palpitations, dyspnea on exertion, peripheral edema.  Respiratory: Negative for dyspnea at rest, dyspnea on exertion, cough, sputum, wheezing.  GI: See history of present illness. GU:  Negative for dysuria, hematuria, urinary incontinence, urinary frequency, nocturnal urination.  Endo: Negative for unusual weight change.    Physical Examination:   BP 115/57 mmHg  Pulse 62  Temp(Src) 97 F (36.1 C)  Ht 5\' 3"  (1.6 m)  Wt 113 lb 3.2 oz (51.347 kg)  BMI 20.06 kg/m2  General: Well-nourished, well-developed in no acute distress.  Eyes: No icterus. Mouth: Oropharyngeal mucosa moist and pink , no lesions erythema or exudate. Lungs: Clear to auscultation bilaterally.  Heart: Regular rate and rhythm, no murmurs rubs or gallops.  Abdomen: Bowel sounds are normal, nontender, nondistended, no hepatosplenomegaly or masses, no abdominal bruits. Small umbilical hernia easily reducible. Patient was very weak abdominal wall muscles, palpable bowel.   Extremities: No lower extremity edema. No clubbing or deformities. Neuro: Alert and oriented x 4   Skin: Warm and dry, no jaundice.   Psych: Alert and cooperative, normal mood and affect.  Labs:  Lab Results  Component Value Date   CREATININE 0.73 01/23/2015   BUN 12 01/23/2015   NA 135 01/23/2015   K 4.2 01/23/2015   CL 102 01/23/2015   CO2 26 01/23/2015   Lab Results  Component Value Date   WBC 5.3 01/23/2015   HGB 11.0* 01/23/2015   HCT 33.3* 01/23/2015   MCV 81.8 01/23/2015   PLT 175 01/23/2015   No results found for: ALT, AST, GGT, ALKPHOS, BILITOT  Imaging Studies: No results found.

## 2015-01-31 NOTE — Telephone Encounter (Signed)
MD in office for the first time this week to sign Plavix RX. I have mailed this to the patient.

## 2015-01-31 NOTE — Progress Notes (Signed)
CC'ED TO PCP 

## 2015-02-03 ENCOUNTER — Ambulatory Visit: Payer: Medicare Other | Admitting: Physician Assistant

## 2015-02-06 ENCOUNTER — Encounter: Payer: Self-pay | Admitting: Cardiology

## 2015-02-06 ENCOUNTER — Ambulatory Visit (INDEPENDENT_AMBULATORY_CARE_PROVIDER_SITE_OTHER): Payer: Medicare Other | Admitting: Cardiology

## 2015-02-06 VITALS — BP 111/66 | HR 50 | Ht 63.0 in | Wt 117.8 lb

## 2015-02-06 DIAGNOSIS — I2510 Atherosclerotic heart disease of native coronary artery without angina pectoris: Secondary | ICD-10-CM

## 2015-02-06 DIAGNOSIS — M7989 Other specified soft tissue disorders: Secondary | ICD-10-CM

## 2015-02-06 MED ORDER — AMLODIPINE BESYLATE 5 MG PO TABS: 5.0000 mg | ORAL_TABLET | Freq: Every day | ORAL | Status: DC

## 2015-02-06 NOTE — Progress Notes (Signed)
Clinical Summary Melissa Holland is a 69 y.o.female seen today for follow up of the following medical problems. This is a focused visit on her history of chest pain and follow up for her recent cath.  1. CAD  - most recent cath 12/2014 with LM mild ostial disease, LAD mid 50%, LCX diffuse disease and occluded in mid vessel, there are left to left collaterals which reconstitute the OM1 and distal LCX, RCA CTO, PDA fills by collateral. The LCX lesion was ballooned, not able to stent the area.  - notes chest pain is much improved since procedure  2. Leg swelling - started a week ago. L>R. Mild pain in left leg. No new SOB, orthopnea, or PND     Past Medical History  Diagnosis Date  . Coronary artery disease     a. Severe single-vessel/100% occluded right with distal collateralization.Medical therapy/Normal LV fx b. cath 01/22/2015 chronically occluded RCA, LVEDP , balloon angioplasty to occluded mid LCx   . Carotid bruit     Bilateral  . Sinus bradycardia   . Dyslipidemia   . DVT (deep venous thrombosis)     Remote portable as him   . COPD (chronic obstructive pulmonary disease)   . Leg pain   . Productive cough   . Peripheral arterial disease   . Carotid artery occlusion   . Anxiety   . Depression   . CHF (congestive heart failure)   . Anginal pain   . Type II diabetes mellitus   . Umbilical hernia   . GERD (gastroesophageal reflux disease)     "one dr says yes; one says no"  . Arthritis     "just about q where"  . Claustrophobia      Allergies  Allergen Reactions  . Penicillins Anaphylaxis  . Sulfonamide Derivatives Anaphylaxis  . Alupent [Metaproterenol Sulfate] Other (See Comments)    Makes pt "nervous"  . Azithromycin Other (See Comments)    unknown  . Linzess [Linaclotide] Nausea And Vomiting  . Omnipaque [Iohexol] Other (See Comments)    Pt states can take mix Dye and Metformin together.  Doesn't have SOB or rash  . Pantoprazole Nausea And Vomiting  .  Paroxetine Other (See Comments)    unknown  . Varenicline Tartrate Other (See Comments)    unknown  . Benadryl [Diphenhydramine Hcl] Other (See Comments)    Can't breath  . Hydrocortisone Hives  . Prednisone Hives     Current Outpatient Prescriptions  Medication Sig Dispense Refill  . albuterol (2.5 MG/3ML) 0.083% NEBU 3 mL, albuterol (5 MG/ML) 0.5% NEBU 0.5 mL Inhale 2.5 mg into the lungs every 6 (six) hours as needed (congestion).     Marland Kitchen albuterol (VENTOLIN HFA) 108 (90 BASE) MCG/ACT inhaler Inhale 2 puffs into the lungs every 6 (six) hours as needed for wheezing or shortness of breath.     . ALPRAZolam (XANAX) 1 MG tablet Take 0.5-1 mg by mouth 4 (four) times daily as needed for anxiety. For nerves    . amLODipine (NORVASC) 5 MG tablet Take 1 tablet (5 mg total) by mouth daily. 90 tablet 3  . aspirin 81 MG tablet Take 81 mg by mouth daily.      . clopidogrel (PLAVIX) 75 MG tablet Take 1 tablet (75 mg total) by mouth daily with breakfast. 90 tablet 3  . furosemide (LASIX) 20 MG tablet Take 20 mg by mouth daily as needed for fluid.     Marland Kitchen guaifenesin (ROBITUSSIN) 100 MG/5ML  syrup Take 200 mg by mouth 3 (three) times daily as needed for cough.    . metFORMIN (GLUCOPHAGE) 500 MG tablet Take 500 mg by mouth 2 (two) times daily.     . metoprolol succinate (TOPROL-XL) 25 MG 24 hr tablet Take 12.5 mg by mouth every morning.     . nitroGLYCERIN (NITROSTAT) 0.4 MG SL tablet Place 1 tablet (0.4 mg total) under the tongue every 5 (five) minutes as needed. For chest pain. 25 tablet 3  . oxyCODONE-acetaminophen (ENDOCET) 10-325 MG per tablet Take 0.5-1 tablets by mouth every 4 (four) hours as needed for pain.     . potassium chloride (KLOR-CON) 10 MEQ CR tablet Take 10 mEq by mouth as needed. When taking Furosemide    . simvastatin (ZOCOR) 40 MG tablet Take 1 tablet (40 mg total) by mouth every evening. 90 tablet 3  . Tetrahydroz-Dextran-PEG-Povid (EYE DROPS ADVANCED RELIEF OP) Apply 1 drop to eye  daily as needed (dry eyes).     No current facility-administered medications for this visit.     Past Surgical History  Procedure Laterality Date  . Parotidectomy Left   . Cystectomy      "2 off my right posterior shoulder; 2 off my left neck"  . Appendectomy  05/1975  . Aortogram      with bilateral lower extremity run-off  . Colonoscopy  12/31/2010    ZOX:[id number]:Long tortuous, but otherwise normal-appearing colon/ Normal rectum  . Esophagogastroduodenoscopy (egd) with propofol N/A 05/16/2014    AVW:[id number]:Esophageal inlet patch otherwise normal/s/p dilation as described above. Antral erosions and a single gastric ulcer s/p gastric bx. mild chronic gastritis. no h.pylori  . Esophageal dilation N/A 05/16/2014    Procedure: ESOPHAGEAL DILATION;  Surgeon: Corbin Adeobert M Rourk, MD;  Location: [facility];  Service: Endoscopy;  Laterality: N/A;  #[id number] maloney  . Esophageal biopsy N/A 05/16/2014    Procedure: BIOPSY of stomach;  Surgeon: Corbin Adeobert M Rourk, MD;  Location: [facility];  Service: Endoscopy;  Laterality: N/A;  . Abdominal angiogram N/A 11/12/2011    Procedure: ABDOMINAL ANGIOGRAM;  Surgeon: Sherren Kernsharles E Fields, MD;  [facility];  Service: Cardiovascular;  Laterality: N/A;  . Visceral angiogram  11/12/2011    Procedure: VISCERAL ANGIOGRAM;  Surgeon: Sherren Kernsharles E Fields, MD;  [facility];  Service: Cardiovascular;;  . Lower extremity angiogram Bilateral 11/12/2011    Procedure: LOWER EXTREMITY ANGIOGRAM;  Surgeon: Sherren Kernsharles E Fields, MD;  [facility];  Service: Cardiovascular;  Laterality: Bilateral;  . Left heart catheterization with coronary angiogram N/A 11/13/2013    Procedure: LEFT HEART CATHETERIZATION WITH CORONARY ANGIOGRAM;  Surgeon: Rollene RotundaJames Hochrein, MD;  [facility];  Service: Cardiovascular;  Laterality: N/A;  . Abdominal aortagram N/A 07/19/2014    Procedure: ABDOMINAL Ronny FlurryAORTAGRAM;  Surgeon: Sherren Kernsharles E Fields, MD;  [facility];  Service: Cardiovascular;  Laterality:  N/A;  . Cardiac catheterization  X 3  . Coronary angioplasty  01/22/2015  . Tonsillectomy and adenoidectomy    . Abdominal hysterectomy  05/1966    "partial w/left tube & ovary"  . Salpingoophorectomy Right 05/1966    "few days after hyster"  . Percutaneous coronary stent intervention (pci-s) N/A 01/22/2015    Procedure: PERCUTANEOUS CORONARY STENT INTERVENTION (PCI-S);  Surgeon: Corky CraftsJayadeep S Varanasi, MD;  [facility];  Service: Cardiovascular;  Laterality: N/A;  . Cardiac catheterization  01/22/2015    Procedure: CORONARY/BYPASS GRAFT CTO INTERVENTION;  Surgeon: Corky CraftsJayadeep S Varanasi, MD;  [facility];  Service: Cardiovascular;;     Allergies  Allergen Reactions  . Penicillins Anaphylaxis  . Sulfonamide Derivatives Anaphylaxis  . Alupent [Metaproterenol Sulfate] Other (See Comments)    Makes pt "nervous"  . Azithromycin Other (See Comments)    unknown  . Linzess [Linaclotide] Nausea And Vomiting  . Omnipaque [Iohexol] Other (See Comments)    Pt states can take mix Dye and Metformin together.  Doesn't have SOB or rash  . Pantoprazole Nausea And Vomiting  . Paroxetine Other (See Comments)    unknown  . Varenicline Tartrate Other (See Comments)    unknown  . Benadryl [Diphenhydramine Hcl] Other (See Comments)    Can't breath  . Hydrocortisone Hives  . Prednisone Hives      Family History  Problem Relation Age of Onset  . Cancer Maternal Grandmother     liver  . Cancer Maternal Grandfather     lung  . Diabetes Mother   . Diabetes Son      Social History Melissa Holland reports that she has been smoking Cigarettes.  She started smoking about 56 years ago. She has a 22.5 pack-year smoking history. She has never used smokeless tobacco. Melissa Holland reports that she does not drink alcohol.   Review of Systems CONSTITUTIONAL: No weight loss, fever, chills, weakness or fatigue.  HEENT: Eyes: No visual loss, blurred vision, double vision or yellow sclerae.No  hearing loss, sneezing, congestion, runny nose or sore throat.  SKIN: No rash or itching.  CARDIOVASCULAR: per HPI RESPIRATORY: No shortness of breath, cough or sputum.  GASTROINTESTINAL: No anorexia, nausea, vomiting or diarrhea. No abdominal pain or blood.  GENITOURINARY: No burning on urination, no polyuria NEUROLOGICAL: No headache, dizziness, syncope, paralysis, ataxia, numbness or tingling in the extremities. No change in bowel or bladder control.  MUSCULOSKELETAL: per HPI LYMPHATICS: No enlarged nodes. No history of splenectomy.  PSYCHIATRIC: No history of depression or anxiety.  ENDOCRINOLOGIC: No reports of sweating, cold or heat intolerance. No polyuria or polydipsia.  Marland Kitchen   Physical Examination p 50 bp 111/66 Wt 117 lbs BMI 21 Gen: resting comfortably, no acute distress HEENT: no scleral icterus, pupils equal round and reactive, no palptable cervical adenopathy,  CV: RRR, no m/r/g, no carotid bruits Resp: Clear to auscultation bilaterally GI: abdomen is soft, non-tender, non-distended, normal bowel sounds, no hepatosplenomegaly MSK: extremities are warm, 1+ LLE edema Skin: warm, no rash Neuro:  no focal deficits Psych: appropriate affect   Diagnostic Studies 10/2011 Echo  LVEF 55-60%, no WMAs   10/2013 Echo  LVEF 50-55%, hypokinesis of the distalanteroseptal wall, akinesis basal mid-inferolateral walls. Grade I diastolic dysfunction.   09/2012 Carotid US:  IMPRESSION:  1. Heterogeneous calcified and noncalcified atherosclerotic plaque in the right carotid bulb and proximal right internal carotid artery results in a 50 - 69% diameter stenosis. No significant interval change in the degree of stenosis compared to 10/07/2010 although subjectively there is increased calcification of the plaque.  2. Interval progression of heterogeneous predominately calcified atherosclerotic plaque in the left carotid bulb and proximal internal carotid artery resulting in a  greater than 70% diameter stenosis.  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 Abnormal lexiscan MPI. There is a fixed apical defect consistent with prior infarction. There is an inferolateral wall defect with mild peri-infarct ischemia, the interpretation of this defect is limited by significant subdiaphragmatic attenuation adjacent to inferolateral wall. There is hypokinesis of this  area, so likely there is a true prior infarct. Interpretation of peri-infarct ischemia of this territory may be limited by subdiaphragmatic attenuation. Correlate findings clinically.  2. Decrased left ventricular systolic function  11/08/13 Clinic EKG  - initial ekg wandering baseline, repeated. Repeat EKG shows sinus rhythm, RBBB, old TWI inferior leads.   10/2013 Cath  Left mainstem: Heavy calcification. No high grade disease  Left anterior descending (LAD): Long proximal 50%. I have compared this lesion to the 2008 films and it appears to be unchanged. The LAD is a very large vessel wrapping the apex. The mid LAD has 30% stenosis. There are diffuse luminal irregularities. This is a mid to distal moderate to large diagonal vessel that has mild luminal irregularities.  Left circumflex (LCx): The circumflex has had worsening stenosis with now occlusion at the ostium of a large branching OM. This OM has severe diffuse disease proximal to the bifurcation. The vessel after this is free of high grade disease.  Right coronary artery (RCA): Occluded in the mid segment.  Left ventriculography: Left ventricular systolic function is mildly reduced with , LVEF is estimated at 50%, there is no significant mitral regurgitation  Final Conclusions: Two vessel occlusion with moderate disease in the proximal LAD.  Recommendations: The patient would need to be considered for CABG if she has continued chest pain. Discussed with Dr. Wyline Mood.     11/14/15 Lexiscan MPI 1. Large area of anterolateral wall ischemia extending from the apex to the base.  2. The anterolateral wall is severely hypokinetic to akinetic.  3. Left ventricular ejection fraction 38%  4. High-risk stress test findings*.     Assessment and Plan  1. CAD - symptoms improved since recent PTCA, will continue medical therapy and follow symptoms.  2. Leg swelling - asymmetric L>R - will obtain LE venous Doppler US    F/u 3 months      Antoine Poche, M.D.

## 2015-02-06 NOTE — Patient Instructions (Addendum)
Your physician has requested that you have a lower extremity arterial exercise duplex. During this test, exercise and ultrasound are used to evaluate arterial blood flow in the legs. Allow one hour for this exam. There are no restrictions or special instructions.  Your physician recommends that you continue on your current medications as directed. Please refer to the Current Medication list given to you today.  WE HAVE GIVEN YOU A RX FOR AMLODIPINE 5 MG   Your physician recommends that you schedule a follow-up appointment in: 3 MONTHS WITH DR. BRANCH  Thank you for choosing [facility]!!

## 2015-02-10 ENCOUNTER — Other Ambulatory Visit: Payer: Self-pay | Admitting: Radiology

## 2015-02-10 DIAGNOSIS — M7989 Other specified soft tissue disorders: Secondary | ICD-10-CM

## 2015-02-12 ENCOUNTER — Institutional Professional Consult (permissible substitution): Payer: Medicare Other | Admitting: Interventional Cardiology

## 2015-02-19 ENCOUNTER — Encounter (INDEPENDENT_AMBULATORY_CARE_PROVIDER_SITE_OTHER): Payer: Medicare Other

## 2015-02-19 DIAGNOSIS — M7989 Other specified soft tissue disorders: Secondary | ICD-10-CM | POA: Diagnosis not present

## 2015-02-20 ENCOUNTER — Telehealth: Payer: Self-pay | Admitting: *Deleted

## 2015-02-20 NOTE — Telephone Encounter (Signed)
Pt made aware. Forwarded to Dr. Juanetta GoslingHawkins. Pt would like to know what could be causing symptoms of spasms and numbness in legs. Pt is scheduled to to see Dr. Juanetta GoslingHawkins in May and vascular in July. Told pt would forward to Dr. Wyline MoodBranch but that would also defer to PCP as well since she has upcoming appts

## 2015-02-20 NOTE — Telephone Encounter (Signed)
Please have her touch base with Dr Juanetta GoslingHawkins, spasms would not be related to anything from a cardiovascular standpoint.  Dominga FerryJ Faithann Natal MD

## 2015-02-20 NOTE — Telephone Encounter (Signed)
-----   Message from Lesle ChrisAngela G Hill, LPN sent at 5/36/64403/24/2016 12:38 PM EDT -----   ----- Message -----    From: Antoine PocheJonathan F Branch, MD    Sent: 02/20/2015  12:00 PM      To: Lesle ChrisAngela G Hill, LPN  No evidence of blood clots by recent US.  Dominga FerryJ Branch MD

## 2015-02-27 ENCOUNTER — Telehealth: Payer: Self-pay

## 2015-02-27 NOTE — Telephone Encounter (Signed)
Phone call from pt.  Requesting an appt. with Dr. Darrick PennaFields for reevaluation of her left LE.  Reported since her heart catheterization 2/24, she has been having changes in her left leg and foot.  Reported an appearance of increased spider veins around the left ankle.  Stated her toes of the left foot are "grayish" in color, except "the small toe is a shiny / red color."  Reported she has some "dry cracking" of her heels.  Denies open sores.  Reported increased redness with numbness and burning of left foot.  Stated she gets pain in her legs with walking and at rest.  Stated it wakes me up at night.  Described discomfort like "pins and needles" in the sole of the left foot.  Also c/o a pain under her left knee cap.  Stated she gets intermittent swelling of the left leg. (noted Bilat. LE venous duplex of 3/24 was negative for DVT) Voiced concern about changes in the left leg and foot, and requested an appt. with Dr. Darrick PennaFields, ASAP.  Advised will return call appt.  Agrees with plan.

## 2015-03-26 ENCOUNTER — Encounter: Payer: Self-pay | Admitting: Vascular Surgery

## 2015-03-27 ENCOUNTER — Ambulatory Visit (HOSPITAL_COMMUNITY)
Admission: RE | Admit: 2015-03-27 | Discharge: 2015-03-27 | Disposition: A | Discharge status: Home / Self Care | Payer: Medicare Other | Source: Ambulatory Visit | Attending: Vascular Surgery | Admitting: Vascular Surgery

## 2015-03-27 ENCOUNTER — Ambulatory Visit (INDEPENDENT_AMBULATORY_CARE_PROVIDER_SITE_OTHER): Payer: Medicare Other | Admitting: Vascular Surgery

## 2015-03-27 ENCOUNTER — Encounter: Payer: Self-pay | Admitting: Vascular Surgery

## 2015-03-27 VITALS — BP 119/58 | HR 72 | Resp 16 | Ht 63.0 in | Wt 107.0 lb

## 2015-03-27 DIAGNOSIS — I739 Peripheral vascular disease, unspecified: Secondary | ICD-10-CM | POA: Diagnosis present

## 2015-03-27 DIAGNOSIS — I2510 Atherosclerotic heart disease of native coronary artery without angina pectoris: Secondary | ICD-10-CM | POA: Diagnosis not present

## 2015-03-27 NOTE — Progress Notes (Signed)
[facility] [place] HISTORY AND PHYSICAL    History of Present Illness:  Patient is a 69 y.o. year old female who presents for evaluation of peripheral arterial disease and asymptomatic moderate carotid stenosis.   She denies any symptoms of TIA amaurosis or stroke.  She continues to complain of pain in her lower extremities. She states that the left leg is the worst. She develops cramping and tightness in her leg after walking. However she primarily complains of cramping in her legs at night time which does not sound like claudication. She now has rest pain. She is a very difficult historian and tends to jump from place to place and is hard to keep on task.   Other medical problems include diabetes, coronary disease, depression and anxiety. These are currently controlled and followed primarily by Dr. [name].  She had an arteriogram in August of 2015. This showed bilateral high grade common femoral stenoses as well as bilateral superficial femoral artery occlusions. She was offered femoral popliteal bypass at that time but refused. She has previously had a left leg vein stripping.    Past Medical History   Diagnosis  Date   .  Diabetes mellitus     .  Coronary artery disease         Severe single-vessel/100% occluded right with distal collateralization.Medical therapy/Normal LV fx   .  Carotid bruit         Bilateral   .  Sinus bradycardia     .  Dyslipidemia     .  DVT (deep venous thrombosis)         Remote portable as him    .  COPD (chronic obstructive pulmonary disease)     .  Arthritis     .  Depression with anxiety     .  Leg pain     .  Productive cough     .  Peripheral arterial disease     .  Carotid artery occlusion         Past Surgical History   Procedure  Laterality  Date   .  Parotidectomy           Left   .  Shoulder blade           Cysts x 2   .  Tonsillectomy and adenoidectomy       .  Partial hysterectomy           Subsequent completion  later that year,hx of large fibroid tumor   .  Appendectomy       .  Aortogram           with bilateral lower extremity run-off   .  Abdominal hysterectomy       .  Colonoscopy    12/31/2010       ZOX:WRUE tortuous, but otherwise normal-appearing colon/ Normal rectum   .  Esophagogastroduodenoscopy (egd) with propofol  N/A  05/16/2014       AVW:UJWJXBJYNW inlet patch otherwise normal/s/p dilation as described above. Antral erosions and a single gastric ulcer s/p gastric bx   .  Esophageal dilation  N/A  05/16/2014       Procedure: ESOPHAGEAL DILATION;  Surgeon: Corbin Ade, MD;  Location: [facility];  Service: Endoscopy;  Laterality: N/A;  #56 maloney   .  Esophageal biopsy  N/A  05/16/2014       Procedure: BIOPSY of stomach;  Surgeon: Corbin Ade, MD;  Location: [facility]  ORS;  Service: Endoscopy;  Laterality: N/A;   .  Abdominal angiogram  N/A  11/12/2011       Procedure: ABDOMINAL ANGIOGRAM;  Surgeon: Sherren Kerns, MD;  Location: [facility] CATH LAB;  Service: Cardiovascular;  Laterality: N/A;   .  Visceral angiogram    11/12/2011       Procedure: VISCERAL ANGIOGRAM;  Surgeon: Sherren Kerns, MD;  Location: [facility] CATH LAB;  Service: Cardiovascular;;   .  Lower extremity angiogram  Bilateral  11/12/2011       Procedure: LOWER EXTREMITY ANGIOGRAM;  Surgeon: Sherren Kerns, MD;  Location: [facility] CATH LAB;  Service: Cardiovascular;  Laterality: Bilateral;   .  Left heart catheterization with coronary angiogram  N/A  11/13/2013       Procedure: LEFT HEART CATHETERIZATION WITH CORONARY ANGIOGRAM;  Surgeon: Rollene Rotunda, MD;  Location: [facility] CATH LAB;  Service: Cardiovascular;  Laterality: N/A;   .  Abdominal aortagram  N/A  07/19/2014       Procedure: ABDOMINAL Ronny Flurry;  Surgeon: Sherren Kerns, MD;  Location: [facility] CATH LAB;  Service: Cardiovascular;  Laterality: N/A;    History      Social History   .  Marital Status:  Widowed       Spouse Name:  N/A       Number of Children:  N/A   .  Years of  Education:  N/A      Occupational History   .  Not on file.      Social History Main Topics   .  Smoking status:  Current Some Day Smoker -- 0.50 packs/day for 52 years       Types:  Cigarettes       Start date:  01/06/1959   .  Smokeless tobacco:  Never Used         Comment: states not smoking as much. Uses patch sometimes   .  Alcohol Use:  No   .  Drug Use:  No   .  Sexual Activity:  Not on file      Other Topics  Concern   .  Not on file      Social History Narrative     Family History Family History      Problem     Relation     Age of Onset      .     Cancer     Maternal Grandmother                     liver      .     Cancer     Maternal Grandfather                     lung      .     Diabetes     Mother           .     Diabetes     Son             Allergies    Allergies      Allergen     Reactions      .     Penicillins     Anaphylaxis      .     Sulfonamide Derivatives     Anaphylaxis      .     Alupent (Metaproterenol Sulfate)     Other (See Comments)  Makes pt "nervous"      .     Azithromycin     Other (See Comments)                unknown      .     Omnipaque (Iohexol)     Other (See Comments)                unknown      .     Paroxetine     Other (See Comments)                unknown      .     Varenicline Tartrate     Other (See Comments)                unknown      .     Benadryl (Diphenhydramine Hcl)     Other (See Comments)                Can't breath      .     Hydrocortisone     Hives      .     Prednisone     Hives           Current Outpatient Prescriptions on File Prior to Visit  Medication Sig Dispense Refill  . albuterol (2.5 MG/3ML) 0.083% NEBU 3 mL, albuterol (5 MG/ML) 0.5% NEBU 0.5 mL Inhale 2.5 mg into the lungs every 6 (six) hours as needed (congestion).     Marland Kitchen albuterol (VENTOLIN HFA) 108 (90 BASE) MCG/ACT inhaler Inhale 2 puffs into the lungs every 6 (six) hours as needed for wheezing or shortness of breath.     .  ALPRAZolam (XANAX) 1 MG tablet Take 0.5-1 mg by mouth 4 (four) times daily as needed for anxiety. For nerves    . amLODipine (NORVASC) 5 MG tablet Take 1 tablet (5 mg total) by mouth daily. 90 tablet 3  . aspirin 81 MG tablet Take 81 mg by mouth daily.      . clopidogrel (PLAVIX) 75 MG tablet Take 1 tablet (75 mg total) by mouth daily with breakfast. 90 tablet 3  . furosemide (LASIX) 20 MG tablet Take 20 mg by mouth daily as needed for fluid.     . metFORMIN (GLUCOPHAGE) 500 MG tablet Take 500 mg by mouth 2 (two) times daily.     . metoprolol succinate (TOPROL-XL) 25 MG 24 hr tablet Take 12.5 mg by mouth every morning.     . nitroGLYCERIN (NITROSTAT) 0.4 MG SL tablet Place 1 tablet (0.4 mg total) under the tongue every 5 (five) minutes as needed. For chest pain. 25 tablet 3  . oxyCODONE-acetaminophen (ENDOCET) 10-325 MG per tablet Take 0.5-1 tablets by mouth every 4 (four) hours as needed for pain.     . potassium chloride (KLOR-CON) 10 MEQ CR tablet Take 10 mEq by mouth as needed. When taking Furosemide    . simvastatin (ZOCOR) 40 MG tablet Take 1 tablet (40 mg total) by mouth every evening. 90 tablet 3  . Tetrahydroz-Dextran-PEG-Povid (EYE DROPS ADVANCED RELIEF OP) Apply 1 drop to eye daily as needed (dry eyes).    Marland Kitchen guaifenesin (ROBITUSSIN) 100 MG/5ML syrup Take 200 mg by mouth 3 (three) times daily as needed for cough.     No current facility-administered medications on file prior to visit.    ROS:    General:  No weight loss, Fever, chills  HEENT: No  recent headaches, no nasal bleeding, no visual changes, no sore throat  Neurologic: No dizziness, blackouts, seizures. No recent symptoms of stroke or mini- stroke. No recent episodes of slurred speech, or temporary blindness.  Cardiac: + recent episodes of chest pain/pressure, no shortness of breath at rest.  No shortness of breath with exertion.  Denies history of atrial fibrillation or irregular heartbeat  Vascular: No history of rest  pain in feet.  No history of non-healing ulcer  Pulmonary: No home oxygen, no productive cough, no hemoptysis,  No asthma or wheezing  Musculoskeletal:  [ ]  Arthritis, [ ]  Low back pain,  [ ]  Joint pain  Hematologic:No history of hypercoagulable state.  No history of easy bleeding.  No history of anemia  Gastrointestinal: No hematochezia or melena,  No gastroesophageal reflux  Urinary: [ ]  chronic Kidney disease, [ ]  on HD - [ ]  MWF or [ ]  TTHS, [ ]  Burning with urination, [ ]  Frequent urination, [ ]  Difficulty urinating;    Skin: No rashes  Psychological: +history of anxiety,  + history of depression   Physical Examination    Filed Vitals:   03/27/15 1322  Height: 5\' 3"  (1.6 m)  Weight: 107 lb (48.535 kg)   General:  Alert and oriented, no acute distress HEENT: Normal Neck: + left  bruit   Pulmonary: Clear to auscultation bilaterally Cardiac: Regular Rate and Rhythm without murmur Abdomen: Soft, non-tender, non-distended, no mass Skin: No rash or ulcer Extremity Pulses:  2+ radial, brachial, femoral, absent dorsalis pedis, posterior tibial pulses bilaterally Musculoskeletal: No deformity or edema     Neurologic: Upper and lower extremity motor 5/5 and symmetric  DATA: She had bilateral ABIs and carotid  duplex previously June 2015. Carotid duplex scan shows 60-80% stenosis left side, 40-60 percent right side. ABI was 0.44 right 0.51 left in June 2015 compared to 0.73 on the left 0.55 on the right 2 years ago   We repeated her ABIs today. No audible flow in the left leg, ABI on the right 0.37.   ASSESSMENT:   #1 moderate carotid stenosis asymptomatic continue antiplatelet therapy and risk factor modification needs repeat carotid duplex in 6 months #2 bilateral lower extremity claudication which has become progressive was slowly declining ABI. Discussed with the patient today that she is at risk of limb loss in the left leg without intervention. She states she currently removed  from a assisted-living facility to a motor oil and does not feel comfortable having a surgical procedure currently. She thinks she needs about a month to get her affairs in order. I again discussed with her the risk of limb loss. She wishes to wait for now. She will follow-up with me in 1 month. She'll call back sooner if she changes her mind. If she decides to proceed with revascularization she would first need repeat arteriogram since it has been several months since her prior arteriogram and she has a significant change in her ABIs.  Fabienne Brunsharles Adora Yeh, MD [facility] [place] Office: [phone] Pager: [phone]

## 2015-04-24 ENCOUNTER — Ambulatory Visit: Payer: Medicare Other | Admitting: Vascular Surgery

## 2015-05-20 ENCOUNTER — Encounter: Payer: Medicare Other | Admitting: Cardiology

## 2015-05-20 NOTE — Progress Notes (Signed)
Encounter opened in error

## 2015-05-28 ENCOUNTER — Encounter: Payer: Self-pay | Admitting: Vascular Surgery

## 2015-05-29 ENCOUNTER — Encounter: Payer: Self-pay | Admitting: Vascular Surgery

## 2015-05-29 ENCOUNTER — Ambulatory Visit (INDEPENDENT_AMBULATORY_CARE_PROVIDER_SITE_OTHER): Payer: Medicare Other | Admitting: Vascular Surgery

## 2015-05-29 VITALS — BP 144/65 | HR 84 | Resp 14 | Ht 63.5 in | Wt 103.0 lb

## 2015-05-29 DIAGNOSIS — I6523 Occlusion and stenosis of bilateral carotid arteries: Secondary | ICD-10-CM | POA: Diagnosis not present

## 2015-05-29 DIAGNOSIS — I2510 Atherosclerotic heart disease of native coronary artery without angina pectoris: Secondary | ICD-10-CM

## 2015-05-29 DIAGNOSIS — I739 Peripheral vascular disease, unspecified: Secondary | ICD-10-CM

## 2015-05-29 NOTE — Progress Notes (Signed)
Filed Vitals:   05/29/15 1607 05/29/15 1609  BP: 160/54 144/65  Pulse: 80 84  Resp: 14   Height: 5' 3.5" (1.613 m)   Weight: 103 lb (46.72 kg)

## 2015-05-29 NOTE — Progress Notes (Signed)
History of Present Illness:  Patient is a 69 y.o. year old female who presents for evaluation of bilateral LE.  She has known PAD and asymptomatic moderate carotid stenosis.  She states her legs and feet feel significantly better and the size and color are back to normal.  She reports no rest pain and can walk around her apartment without pain.  She does continue to smoke daily.      Other medical problems include coronary artery disease, hyperlipidemia, COPD, diabetes all of which are currently stable.  Past Medical History  Diagnosis Date  . Coronary artery disease     a. Severe single-vessel/100% occluded right with distal collateralization.Medical therapy/Normal LV fx b. cath 01/22/2015 chronically occluded RCA, LVEDP , balloon angioplasty to occluded mid LCx   . Carotid bruit     Bilateral  . Sinus bradycardia   . Dyslipidemia   . DVT (deep venous thrombosis)     Remote portable as him   . COPD (chronic obstructive pulmonary disease)   . Leg pain   . Productive cough   . Peripheral arterial disease   . Carotid artery occlusion   . Anxiety   . Depression   . CHF (congestive heart failure)   . Anginal pain   . Type II diabetes mellitus   . Umbilical hernia   . GERD (gastroesophageal reflux disease)     "one dr says yes; one says no"  . Arthritis     "just about q where"  . Claustrophobia     Past Surgical History  Procedure Laterality Date  . Parotidectomy Left   . Cystectomy      "2 off my right posterior shoulder; 2 off my left neck"  . Appendectomy  05/1975  . Aortogram      with bilateral lower extremity run-off  . Colonoscopy  12/31/2010    ZOX:WRUE tortuous, but otherwise normal-appearing colon/ Normal rectum  . Esophagogastroduodenoscopy (egd) with propofol N/A 05/16/2014    AVW:UJWJXBJYNW inlet patch otherwise normal/s/p dilation as described above. Antral erosions and a single gastric ulcer s/p gastric bx. mild chronic gastritis. no h.pylori  .  Esophageal dilation N/A 05/16/2014    Procedure: ESOPHAGEAL DILATION;  Surgeon: Corbin Ade, MD;  Location: [facility];  Service: Endoscopy;  Laterality: N/A;  #56 maloney  . Esophageal biopsy N/A 05/16/2014    Procedure: BIOPSY of stomach;  Surgeon: Corbin Ade, MD;  Location: [facility] ORS;  Service: Endoscopy;  Laterality: N/A;  . Abdominal angiogram N/A 11/12/2011    Procedure: ABDOMINAL ANGIOGRAM;  Surgeon: Sherren Kerns, MD;  Location: [facility] CATH LAB;  Service: Cardiovascular;  Laterality: N/A;  . Visceral angiogram  11/12/2011    Procedure: VISCERAL ANGIOGRAM;  Surgeon: Sherren Kerns, MD;  Location: [facility] CATH LAB;  Service: Cardiovascular;;  . Lower extremity angiogram Bilateral 11/12/2011    Procedure: LOWER EXTREMITY ANGIOGRAM;  Surgeon: Sherren Kerns, MD;  Location: [facility] CATH LAB;  Service: Cardiovascular;  Laterality: Bilateral;  . Left heart catheterization with coronary angiogram N/A 11/13/2013    Procedure: LEFT HEART CATHETERIZATION WITH CORONARY ANGIOGRAM;  Surgeon: Rollene Rotunda, MD;  Location: [facility] CATH LAB;  Service: Cardiovascular;  Laterality: N/A;  . Abdominal aortagram N/A 07/19/2014    Procedure: ABDOMINAL Ronny Flurry;  Surgeon: Sherren Kerns, MD;  Location: [facility] CATH LAB;  Service: Cardiovascular;  Laterality: N/A;  . Cardiac catheterization  X 3  . Coronary angioplasty  01/22/2015  . Tonsillectomy and adenoidectomy    .  Abdominal hysterectomy  05/1966    "partial w/left tube & ovary"  . Salpingoophorectomy Right 05/1966    "few days after hyster"  . Percutaneous coronary stent intervention (pci-s) N/A 01/22/2015    Procedure: PERCUTANEOUS CORONARY STENT INTERVENTION (PCI-S);  Surgeon: Corky Crafts, MD;  Location: [facility] CATH LAB;  Service: Cardiovascular;  Laterality: N/A;  . Cardiac catheterization  01/22/2015    Procedure: CORONARY/BYPASS GRAFT CTO INTERVENTION;  Surgeon: Corky Crafts, MD;  Location: [facility] CATH LAB;  Service: Cardiovascular;;    Social  History History  Substance Use Topics  . Smoking status: Current Some Day Smoker -- 0.50 packs/day for 45 years    Types: Cigarettes    Start date: 01/06/1959  . Smokeless tobacco: Never Used  . Alcohol Use: No    Family History Family History  Problem Relation Age of Onset  . Cancer Maternal Grandmother     liver  . Cancer Maternal Grandfather     lung  . Diabetes Mother   . Diabetes Son     Allergies  Allergies  Allergen Reactions  . Penicillins Anaphylaxis  . Sulfonamide Derivatives Anaphylaxis  . Alupent [Metaproterenol Sulfate] Other (See Comments)    Makes pt "nervous"  . Azithromycin Other (See Comments)    unknown  . Linzess [Linaclotide] Nausea And Vomiting  . Omnipaque [Iohexol] Other (See Comments)    Pt states can take mix Dye and Metformin together.  Doesn't have SOB or rash  . Pantoprazole Nausea And Vomiting  . Paroxetine Other (See Comments)    unknown  . Varenicline Tartrate Other (See Comments)    unknown  . Benadryl [Diphenhydramine Hcl] Other (See Comments)    Can't breath  . Hydrocortisone Hives  . Prednisone Hives     Current Outpatient Prescriptions  Medication Sig Dispense Refill  . albuterol (2.5 MG/3ML) 0.083% NEBU 3 mL, albuterol (5 MG/ML) 0.5% NEBU 0.5 mL Inhale 2.5 mg into the lungs every 6 (six) hours as needed (congestion).     Marland Kitchen albuterol (VENTOLIN HFA) 108 (90 BASE) MCG/ACT inhaler Inhale 2 puffs into the lungs every 6 (six) hours as needed for wheezing or shortness of breath.     . ALPRAZolam (XANAX) 1 MG tablet Take 0.5-1 mg by mouth 4 (four) times daily as needed for anxiety. For nerves    . amLODipine (NORVASC) 5 MG tablet Take 1 tablet (5 mg total) by mouth daily. 90 tablet 3  . aspirin 81 MG tablet Take 81 mg by mouth daily.      . clopidogrel (PLAVIX) 75 MG tablet Take 1 tablet (75 mg total) by mouth daily with breakfast. 90 tablet 3  . Dextromethorphan-Guaifenesin (DIABETIC TUSSIN DM MAX ST PO) Take by mouth as needed. Per  phone call from patient after office visit 03-27-15    . furosemide (LASIX) 20 MG tablet Take 20 mg by mouth daily as needed for fluid.     . metFORMIN (GLUCOPHAGE) 500 MG tablet Take 500 mg by mouth 2 (two) times daily.     . metoprolol succinate (TOPROL-XL) 25 MG 24 hr tablet Take 12.5 mg by mouth every morning.     . Multiple Vitamins-Minerals (MULTIVITAMIN PO) Take by mouth daily.    . nitroGLYCERIN (NITROSTAT) 0.4 MG SL tablet Place 1 tablet (0.4 mg total) under the tongue every 5 (five) minutes as needed. For chest pain. 25 tablet 3  . oxyCODONE-acetaminophen (ENDOCET) 10-325 MG per tablet Take 0.5-1 tablets by mouth every 4 (four) hours as needed for  pain.     . potassium chloride (KLOR-CON) 10 MEQ CR tablet Take 10 mEq by mouth as needed. When taking Furosemide    . simvastatin (ZOCOR) 40 MG tablet Take 1 tablet (40 mg total) by mouth every evening. 90 tablet 3  . Tetrahydroz-Dextran-PEG-Povid (EYE DROPS ADVANCED RELIEF OP) Apply 1 drop to eye daily as needed (dry eyes).    Marland Kitchen. guaifenesin (ROBITUSSIN) 100 MG/5ML syrup Take 200 mg by mouth 3 (three) times daily as needed for cough.     No current facility-administered medications for this visit.    ROS:   General:  No weight loss, Fever, chills  HEENT: No recent headaches, no nasal bleeding, no visual changes, no sore throat  Neurologic: No dizziness, blackouts, seizures. No recent symptoms of stroke or mini- stroke. No recent episodes of slurred speech, or temporary blindness.  Cardiac: No recent episodes of chest pain/pressure, no shortness of breath at rest.  No shortness of breath with exertion.  Denies history of atrial fibrillation or irregular heartbeat  Vascular: No history of rest pain in feet.  No history of claudication.  No history of non-healing ulcer, No history of DVT   Pulmonary: No home oxygen, no productive cough, no hemoptysis,  No asthma or wheezing  Musculoskeletal:  [ ]  Arthritis, [ ]  Low back pain,  [ ]  Joint  pain  Hematologic:No history of hypercoagulable state.  No history of easy bleeding.  No history of anemia  Gastrointestinal: No hematochezia or melena,  No gastroesophageal reflux, no trouble swallowing  Urinary: [ ]  chronic Kidney disease, [ ]  on HD - [ ]  MWF or [ ]  TTHS, [ ]  Burning with urination, [ ]  Frequent urination, [ ]  Difficulty urinating;   Skin: No rashes  Psychological: No history of anxiety,  No history of depression   Physical Examination  Filed Vitals:   05/29/15 1607 05/29/15 1609  BP: 160/54 144/65  Pulse: 80 84  Resp: 14   Height: 5' 3.5" (1.613 m)   Weight: 103 lb (46.72 kg)     Body mass index is 17.96 kg/(m^2).  General:  Alert and oriented, no acute distress HEENT: Normal Neck: No bruit or JVD Pulmonary: wheezing all lung fields to auscultation bilaterally Cardiac: Regular Rate and Rhythm without murmur Abdomen: Soft, non-tender, non-distended, no mass, no scars Skin: No rash Extremity Pulses:  2+ radial, brachial, femoral, non palpable pedal pulses. Neurologic: Upper and lower extremity motor 5/5 and symmetric     ASSESSMENT/  PLAN:   moderate carotid stenosis asymptomatic and bilateral LE PAD.  Continue antiplatelet therapy and risk factor modification needs repeat carotid duplex and LE ABI's in 3 months with our NP.      Melissa Holland, Melissa Holland [facility] PA-C [facility] [place] [facility]  The patient was seen today in conjunction with Dr. Darrick PennaFields  History and exam details as above. Mr. Renato Shinurman looks better today than I have seen her in several years. She is currently asymptomatic from her PAD. She is also a symptomatically from her carotid stenosis. She will follow-up in 3 months time for repeat carotid duplex exam as well as bilateral ABIs.  Fabienne Brunsharles Fields, MD [facility] [place] Office: [phone] Pager: [phone]

## 2015-05-30 NOTE — Addendum Note (Signed)
Addended by: Adria DillELDRIDGE-LEWIS, Venna Berberich L on: 05/30/2015 09:54 AM   Modules accepted: Orders

## 2015-06-19 ENCOUNTER — Encounter (HOSPITAL_COMMUNITY): Payer: Medicare Other

## 2015-06-19 ENCOUNTER — Ambulatory Visit: Payer: Medicare Other | Admitting: Vascular Surgery

## 2015-08-01 ENCOUNTER — Telehealth: Payer: Self-pay

## 2015-08-01 NOTE — Telephone Encounter (Signed)
Phone call from pt. Reported she is having problems with her left leg for approx. 1.5-2 weeks.  C/o left foot pain at rest.  C/o no feeling in her left foot;c/o left .  Stated the arch of the (L) foot is red, with gray and white discoloration on top, from ankle down to toes, and toe colors are "black, purple and red."  Stated she can hardly walk.  Reported unable to move the 3rd-5th toes, and has a little movement in the 2nd toe.  Also, reported she has a scraped area on the shin of the left leg, that occurred yesterday.  Advised pt. she should go to the ER with the recent changes in her left foot.  Advised that the symptoms indicate there has been decrease in the circulation to the lower leg/ foot.  Pt. Verb. Understanding.

## 2015-08-08 ENCOUNTER — Telehealth: Payer: Self-pay

## 2015-08-08 NOTE — Telephone Encounter (Signed)
Phone call from pt.  Reported since call to office on 9/2, the color of the left foot had improved for a few days, so she didn't go to the hospital to be evaluated.  Reported she had an injury to her left foot when a 250 # man stepped on it approx. 3 weeks. ago.  Reported that there is slight swelling in the left ankle.  Stated she has a red-gray color to her ankle, sole, and toes of the left foot.  Stated she has been very careful to apply warm compresses to the left foot.  Reported, today, she bumped an area on the left lower leg that bled; reported the bleeding has stopped.  Is requesting an appt. with Dr. Darrick Penna sooner than the October 6th.  Advised will have a scheduler call back with appt. info.  Agrees with plan.

## 2015-08-10 ENCOUNTER — Emergency Department (HOSPITAL_COMMUNITY): Payer: Medicare Other

## 2015-08-10 ENCOUNTER — Emergency Department (HOSPITAL_COMMUNITY)
Admission: EM | Admit: 2015-08-10 | Discharge: 2015-08-10 | Disposition: A | Discharge status: Home / Self Care | Payer: Medicare Other | Attending: Emergency Medicine | Admitting: Emergency Medicine

## 2015-08-10 ENCOUNTER — Encounter (HOSPITAL_COMMUNITY): Payer: Self-pay | Admitting: Emergency Medicine

## 2015-08-10 DIAGNOSIS — I509 Heart failure, unspecified: Secondary | ICD-10-CM | POA: Diagnosis not present

## 2015-08-10 DIAGNOSIS — I25119 Atherosclerotic heart disease of native coronary artery with unspecified angina pectoris: Secondary | ICD-10-CM | POA: Insufficient documentation

## 2015-08-10 DIAGNOSIS — Y9389 Activity, other specified: Secondary | ICD-10-CM | POA: Insufficient documentation

## 2015-08-10 DIAGNOSIS — Z720 Tobacco use: Secondary | ICD-10-CM | POA: Diagnosis not present

## 2015-08-10 DIAGNOSIS — Z86718 Personal history of other venous thrombosis and embolism: Secondary | ICD-10-CM | POA: Insufficient documentation

## 2015-08-10 DIAGNOSIS — J449 Chronic obstructive pulmonary disease, unspecified: Secondary | ICD-10-CM | POA: Diagnosis not present

## 2015-08-10 DIAGNOSIS — F329 Major depressive disorder, single episode, unspecified: Secondary | ICD-10-CM | POA: Insufficient documentation

## 2015-08-10 DIAGNOSIS — E785 Hyperlipidemia, unspecified: Secondary | ICD-10-CM | POA: Insufficient documentation

## 2015-08-10 DIAGNOSIS — Z9861 Coronary angioplasty status: Secondary | ICD-10-CM | POA: Insufficient documentation

## 2015-08-10 DIAGNOSIS — S9032XA Contusion of left foot, initial encounter: Secondary | ICD-10-CM | POA: Insufficient documentation

## 2015-08-10 DIAGNOSIS — F419 Anxiety disorder, unspecified: Secondary | ICD-10-CM | POA: Diagnosis not present

## 2015-08-10 DIAGNOSIS — Z7982 Long term (current) use of aspirin: Secondary | ICD-10-CM | POA: Diagnosis not present

## 2015-08-10 DIAGNOSIS — M1990 Unspecified osteoarthritis, unspecified site: Secondary | ICD-10-CM | POA: Diagnosis not present

## 2015-08-10 DIAGNOSIS — X58XXXA Exposure to other specified factors, initial encounter: Secondary | ICD-10-CM | POA: Diagnosis not present

## 2015-08-10 DIAGNOSIS — E119 Type 2 diabetes mellitus without complications: Secondary | ICD-10-CM | POA: Insufficient documentation

## 2015-08-10 DIAGNOSIS — Y9289 Other specified places as the place of occurrence of the external cause: Secondary | ICD-10-CM | POA: Diagnosis not present

## 2015-08-10 DIAGNOSIS — S99922A Unspecified injury of left foot, initial encounter: Secondary | ICD-10-CM | POA: Diagnosis present

## 2015-08-10 DIAGNOSIS — Z7902 Long term (current) use of antithrombotics/antiplatelets: Secondary | ICD-10-CM | POA: Insufficient documentation

## 2015-08-10 DIAGNOSIS — Y998 Other external cause status: Secondary | ICD-10-CM | POA: Diagnosis not present

## 2015-08-10 DIAGNOSIS — Z880 Allergy status to penicillin: Secondary | ICD-10-CM | POA: Diagnosis not present

## 2015-08-10 DIAGNOSIS — Z9889 Other specified postprocedural states: Secondary | ICD-10-CM | POA: Insufficient documentation

## 2015-08-10 DIAGNOSIS — Z79899 Other long term (current) drug therapy: Secondary | ICD-10-CM | POA: Insufficient documentation

## 2015-08-10 NOTE — ED Notes (Addendum)
Patient c/o left foot pain. Per patient has vascular "problems" and since foot has been stepped on 3 weeks ago. Foot has had swelling and foot has been black and red in color. Patient denies having x-ray after foot injury. Foot is red in color with swelling noted. Unable to palpate pedal pulse. Patient reports taking percocet with some relief.

## 2015-08-10 NOTE — ED Notes (Signed)
Pt d/c, but became extremely upset when walking out of room stating, "they told me up front they would give my heart medicine, my sugar medicine, and something to eat if it got close to 5pm. That's a cruel joke they played. I won't be coming back here that's for sure. Pt asked if she would like to speak to MD, but pt refused and continued to walk out.

## 2015-08-10 NOTE — ED Provider Notes (Signed)
CSN: [id number]     Arrival date & time 08/10/15  1416 History   First MD Initiated Contact with Patient 08/10/15 1646     Chief Complaint  Patient presents with  . Foot Pain     (Consider location/radiation/quality/duration/timing/severity/associated sxs/prior Treatment) Patient is a 69 y.o. female presenting with lower extremity pain. The history is provided by the patient (The patient states that her foot was stepped on 3 weeks ago.. Patient complains of pain to her left foot she has a chronic history of poor circulation to left foot).  Foot Pain This is a new problem. The current episode started more than 2 days ago. The problem occurs constantly. The problem has not changed since onset.Pertinent negatives include no chest pain, no abdominal pain and no headaches. Exacerbated by: palpation. Nothing relieves the symptoms.    Past Medical History  Diagnosis Date  . Coronary artery disease     a. Severe single-vessel/100% occluded right with distal collateralization.Medical therapy/Normal LV fx b. cath 01/22/2015 chronically occluded RCA, LVEDP , balloon angioplasty to occluded mid LCx   . Carotid bruit     Bilateral  . Sinus bradycardia   . Dyslipidemia   . DVT (deep venous thrombosis)     Remote portable as him   . COPD (chronic obstructive pulmonary disease)   . Leg pain   . Productive cough   . Peripheral arterial disease   . Carotid artery occlusion   . Anxiety   . Depression   . CHF (congestive heart failure)   . Anginal pain   . Type II diabetes mellitus   . Umbilical hernia   . GERD (gastroesophageal reflux disease)     "one dr says yes; one says no"  . Arthritis     "just about q where"  . Claustrophobia    Past Surgical History  Procedure Laterality Date  . Parotidectomy Left   . Cystectomy      "2 off my right posterior shoulder; 2 off my left neck"  . Appendectomy  05/1975  . Aortogram      with bilateral lower extremity run-off  . Colonoscopy   12/31/2010    NWG:NFAO tortuous, but otherwise normal-appearing colon/ Normal rectum  . Esophagogastroduodenoscopy (egd) with propofol N/A 05/16/2014    ZHY:QMVHQIONGE inlet patch otherwise normal/s/p dilation as described above. Antral erosions and a single gastric ulcer s/p gastric bx. mild chronic gastritis. no h.pylori  . Esophageal dilation N/A 05/16/2014    Procedure: ESOPHAGEAL DILATION;  Surgeon: Corbin Ade, MD;  Location: [facility];  Service: Endoscopy;  Laterality: N/A;  #56 maloney  . Esophageal biopsy N/A 05/16/2014    Procedure: BIOPSY of stomach;  Surgeon: Corbin Ade, MD;  Location: [facility];  Service: Endoscopy;  Laterality: N/A;  . Abdominal angiogram N/A 11/12/2011    Procedure: ABDOMINAL ANGIOGRAM;  Surgeon: Sherren Kerns, MD;  Location: [facility] CATH LAB;  Service: Cardiovascular;  Laterality: N/A;  . Visceral angiogram  11/12/2011    Procedure: VISCERAL ANGIOGRAM;  Surgeon: Sherren Kerns, MD;  Location: [facility] CATH LAB;  Service: Cardiovascular;;  . Lower extremity angiogram Bilateral 11/12/2011    Procedure: LOWER EXTREMITY ANGIOGRAM;  Surgeon: Sherren Kerns, MD;  Location: [facility] CATH LAB;  Service: Cardiovascular;  Laterality: Bilateral;  . Left heart catheterization with coronary angiogram N/A 11/13/2013    Procedure: LEFT HEART CATHETERIZATION WITH CORONARY ANGIOGRAM;  Surgeon: Rollene Rotunda, MD;  Location: [facility] CATH LAB;  Service: Cardiovascular;  Laterality: N/A;  . Abdominal  aortagram N/A 07/19/2014    Procedure: ABDOMINAL Ronny Flurry;  Surgeon: Sherren Kerns, MD;  Location: [facility] CATH LAB;  Service: Cardiovascular;  Laterality: N/A;  . Cardiac catheterization  X 3  . Coronary angioplasty  01/22/2015  . Tonsillectomy and adenoidectomy    . Abdominal hysterectomy  05/1966    "partial w/left tube & ovary"  . Salpingoophorectomy Right 05/1966    "few days after hyster"  . Percutaneous coronary stent intervention (pci-s) N/A 01/22/2015    Procedure: PERCUTANEOUS CORONARY STENT  INTERVENTION (PCI-S);  Surgeon: Corky Crafts, MD;  Location: [facility] CATH LAB;  Service: Cardiovascular;  Laterality: N/A;  . Cardiac catheterization  01/22/2015    Procedure: CORONARY/BYPASS GRAFT CTO INTERVENTION;  Surgeon: Corky Crafts, MD;  Location: [facility] CATH LAB;  Service: Cardiovascular;;   Family History  Problem Relation Age of Onset  . Cancer Maternal Grandmother     liver  . Cancer Maternal Grandfather     lung  . Diabetes Mother   . Diabetes Son    Social History  Substance Use Topics  . Smoking status: Current Some Day Smoker -- 0.50 packs/day for 45 years    Types: Cigarettes    Start date: 01/06/1959  . Smokeless tobacco: Never Used  . Alcohol Use: No   OB History    No data available     Review of Systems  Constitutional: Negative for appetite change and fatigue.  HENT: Negative for congestion, ear discharge and sinus pressure.   Eyes: Negative for discharge.  Respiratory: Negative for cough.   Cardiovascular: Negative for chest pain.  Gastrointestinal: Negative for abdominal pain and diarrhea.  Genitourinary: Negative for frequency and hematuria.  Musculoskeletal: Negative for back pain.       Foot pain  Skin: Negative for rash.  Neurological: Negative for seizures and headaches.  Psychiatric/Behavioral: Negative for hallucinations.      Allergies  Penicillins; Sulfonamide derivatives; Alupent; Azithromycin; Linzess; Omnipaque; Pantoprazole; Paroxetine; Varenicline tartrate; Benadryl; Hydrocortisone; and Prednisone  Home Medications   Prior to Admission medications   Medication Sig Start Date End Date Taking? Authorizing Provider  albuterol (2.5 MG/3ML) 0.083% NEBU 3 mL, albuterol (5 MG/ML) 0.5% NEBU 0.5 mL Inhale 2.5 mg into the lungs every 6 (six) hours as needed (congestion).     Historical Provider, MD  albuterol (VENTOLIN HFA) 108 (90 BASE) MCG/ACT inhaler Inhale 2 puffs into the lungs every 6 (six) hours as needed for wheezing or  shortness of breath.     Historical Provider, MD  ALPRAZolam Prudy Feeler) 1 MG tablet Take 0.5-1 mg by mouth 4 (four) times daily as needed for anxiety. For nerves    Historical Provider, MD  amLODipine (NORVASC) 5 MG tablet Take 1 tablet (5 mg total) by mouth daily. 02/06/15   Antoine Poche, MD  aspirin 81 MG tablet Take 81 mg by mouth daily.      Historical Provider, MD  clopidogrel (PLAVIX) 75 MG tablet Take 1 tablet (75 mg total) by mouth daily with breakfast. 01/24/15   Corky Crafts, MD  Dextromethorphan-Guaifenesin (DIABETIC TUSSIN DM MAX ST PO) Take by mouth as needed. Per phone call from patient after office visit 03-27-15    Historical Provider, MD  furosemide (LASIX) 20 MG tablet Take 20 mg by mouth daily as needed for fluid.     Historical Provider, MD  guaifenesin (ROBITUSSIN) 100 MG/5ML syrup Take 200 mg by mouth 3 (three) times daily as needed for cough.    Historical Provider, MD  metFORMIN (GLUCOPHAGE) 500 MG tablet Take 500 mg by mouth 2 (two) times daily.     Historical Provider, MD  metoprolol succinate (TOPROL-XL) 25 MG 24 hr tablet Take 12.5 mg by mouth every morning.     Historical Provider, MD  Multiple Vitamins-Minerals (MULTIVITAMIN PO) Take by mouth daily.    Historical Provider, MD  nitroGLYCERIN (NITROSTAT) 0.4 MG SL tablet Place 1 tablet (0.4 mg total) under the tongue every 5 (five) minutes as needed. For chest pain. 04/06/12   Rande Brunt, PA-C  oxyCODONE-acetaminophen (ENDOCET) 10-325 MG per tablet Take 0.5-1 tablets by mouth every 4 (four) hours as needed for pain.     Historical Provider, MD  potassium chloride (KLOR-CON) 10 MEQ CR tablet Take 10 mEq by mouth as needed. When taking Furosemide    Historical Provider, MD  simvastatin (ZOCOR) 40 MG tablet Take 1 tablet (40 mg total) by mouth every evening. 05/25/12   Rande Brunt, PA-C  Tetrahydroz-Dextran-PEG-Povid (EYE DROPS ADVANCED RELIEF OP) Apply 1 drop to eye daily as needed (dry eyes).    Historical  Provider, MD   BP 131/65 mmHg  Pulse 63  Temp(Src) 97.9 F (36.6 C) (Oral)  Resp 18  Ht  (1.6 m)  Wt 104 lb 3.2 oz (47.265 kg)  BMI 18.46 kg/m2  SpO2 99% Physical Exam  Constitutional: She is oriented to person, place, and time. She appears well-developed.  HENT:  Head: Normocephalic.  Eyes: Conjunctivae and EOM are normal. No scleral icterus.  Neck: Neck supple. No thyromegaly present.  Cardiovascular: Normal rate and regular rhythm.  Exam reveals no gallop and no friction rub.   No murmur heard. Pulmonary/Chest: No stridor. She has no wheezes. She has no rales. She exhibits no tenderness.  Abdominal: She exhibits no distension. There is no tenderness. There is no rebound.  Musculoskeletal:  Patient has tenderness over the top of left foot. Minor redness to the foot. Slow capillary refill. Dorsalis pedis 1+. Patient states that her foot has been like this for years. She had surgery on her veins years ago. The vascular surgeon Dr. Darrick Penna follows her foot recently stated it looked better than usual.  Lymphadenopathy:    She has no cervical adenopathy.  Neurological: She is oriented to person, place, and time. She exhibits normal muscle tone. Coordination normal.  Skin: No rash noted. No erythema.  Psychiatric: She has a normal mood and affect. Her behavior is normal.    ED Course  Procedures (including critical care time) Labs Review Labs Reviewed - No data to display  Imaging Review Dg Foot Complete Left  08/10/2015   CLINICAL DATA:  Left foot pain, trauma 3 weeks ago  EXAM: LEFT FOOT - COMPLETE 3+ VIEW  COMPARISON:  None.  FINDINGS: There is no evidence of fracture or dislocation. There is no evidence of arthropathy or other focal bone abnormality. Soft tissues are unremarkable.  IMPRESSION: Negative.   Electronically Signed   By: Christiana Pellant M.D.   On: 08/10/2015 15:43   I have personally reviewed and evaluated these images and lab results as part of my medical  decision-making.   EKG Interpretation None      MDM   Final diagnoses:  Foot contusion, left, initial encounter    Contusion left foot with chronic poor circulation. Patient is to try not to stand on her foot for long periods of time. She will follow-up with her breast surgeon.    Bethann Berkshire, MD 08/10/15 [phone]

## 2015-08-10 NOTE — ED Notes (Signed)
All triage documentation charted by Crystal Robinson,RN. 

## 2015-08-10 NOTE — ED Notes (Signed)
Pedal pulse palpated with doppler.

## 2015-08-10 NOTE — Discharge Instructions (Signed)
Follow up with Dr. Darrick Penna in 1-2 weeks for recheck of foot

## 2015-08-10 NOTE — ED Notes (Signed)
MD Zammit at bedside. 

## 2015-08-11 ENCOUNTER — Encounter: Payer: Self-pay | Admitting: Family

## 2015-08-11 NOTE — Telephone Encounter (Signed)
Spoke with pt to schedule an appointment. She will try to get her son to bring her. She will call back if she can not get a ride.

## 2015-08-12 ENCOUNTER — Ambulatory Visit (INDEPENDENT_AMBULATORY_CARE_PROVIDER_SITE_OTHER): Payer: Medicare Other | Admitting: Family

## 2015-08-12 ENCOUNTER — Encounter: Payer: Self-pay | Admitting: Family

## 2015-08-12 ENCOUNTER — Ambulatory Visit (HOSPITAL_COMMUNITY)
Admission: RE | Admit: 2015-08-12 | Discharge: 2015-08-12 | Disposition: A | Discharge status: Home / Self Care | Payer: Medicare Other | Source: Ambulatory Visit | Attending: Family | Admitting: Family

## 2015-08-12 VITALS — BP 126/62 | HR 70 | Temp 97.1°F | Resp 14 | Ht 63.0 in | Wt 104.0 lb

## 2015-08-12 DIAGNOSIS — I739 Peripheral vascular disease, unspecified: Secondary | ICD-10-CM

## 2015-08-12 DIAGNOSIS — E119 Type 2 diabetes mellitus without complications: Secondary | ICD-10-CM | POA: Diagnosis not present

## 2015-08-12 DIAGNOSIS — I2510 Atherosclerotic heart disease of native coronary artery without angina pectoris: Secondary | ICD-10-CM

## 2015-08-12 DIAGNOSIS — Z720 Tobacco use: Secondary | ICD-10-CM | POA: Diagnosis not present

## 2015-08-12 DIAGNOSIS — F17200 Nicotine dependence, unspecified, uncomplicated: Secondary | ICD-10-CM

## 2015-08-12 DIAGNOSIS — I70229 Atherosclerosis of native arteries of extremities with rest pain, unspecified extremity: Secondary | ICD-10-CM

## 2015-08-12 DIAGNOSIS — I998 Other disorder of circulatory system: Secondary | ICD-10-CM | POA: Diagnosis not present

## 2015-08-12 DIAGNOSIS — E785 Hyperlipidemia, unspecified: Secondary | ICD-10-CM | POA: Diagnosis not present

## 2015-08-12 DIAGNOSIS — I6523 Occlusion and stenosis of bilateral carotid arteries: Secondary | ICD-10-CM | POA: Insufficient documentation

## 2015-08-12 NOTE — Progress Notes (Signed)
[facility] [place] HISTORY AND PHYSICAL -PAD  History of Present Illness Melissa Holland is a 69 y.o. female patient of Dr. Darrick Penna. On 05/29/15 Dr. Darrick Penna assessment:  Ms. Buskirk looks better today than I have seen her in several years. She is currently asymptomatic from her PAD. She is also asymptomatic from her carotid stenosis. She will follow-up in 3 months time for repeat carotid duplex exam as well as bilateral ABIs.  On 03/27/15 Dr. Darrick Penna assessment is as follows: 1 moderate carotid stenosis asymptomatic continue antiplatelet therapy and risk factor modification needs repeat carotid duplex in 6 months #2 bilateral lower extremity claudication which has become progressive was slowly declining ABI. Discussed with the patient today that she is at risk of limb loss in the left leg without intervention. She states she currently removed from a assisted-living facility to a motor oil and does not feel comfortable having a surgical procedure currently. She thinks she needs about a month to get her affairs in order. I again discussed with her the risk of limb loss. She wishes to wait for now. She will follow-up with me in 1 month. She'll call back sooner if she changes her mind. If she decides to proceed with revascularization she would first need repeat arteriogram since it has been several months since her prior arteriogram and she has a significant change in her ABIs.  Her left ABI was 0.00 at that visit.   She returns today earlier than scheduled due to left foot injury about 3 weeks ago with discoloration and pain in the left foot and ankle. Her left toes were stepped on. Pt was evaluated at [facility] ED 2 days ago for this and was advised to follow up in our office.  Pt states she was having no pain or problems with her left lower extremity until this injury. She is not having pain in the left foot now, states the pain comes and goes.  She has claudication in  both legs with walking, left worse than right, but states she can complete her [employer] shopping by walking with minimal left foot and calf pain.   States she was told that she had a stroke in 2008.  Pt Diabetic: Yes, sounds in control by pr report Pt smoker: smoker  (1/3 ppd, started at age 1 yrs)  Pt meds include: Statin :Yes Betablocker: Yes ASA: Yes Other anticoagulants/antiplatelets: Plavix  Past Medical History  Diagnosis Date  . Coronary artery disease     a. Severe single-vessel/100% occluded right with distal collateralization.Medical therapy/Normal LV fx b. cath 01/22/2015 chronically occluded RCA, LVEDP , balloon angioplasty to occluded mid LCx   . Carotid bruit     Bilateral  . Sinus bradycardia   . Dyslipidemia   . DVT (deep venous thrombosis)     Remote portable as him   . COPD (chronic obstructive pulmonary disease)   . Leg pain   . Productive cough   . Peripheral arterial disease   . Carotid artery occlusion   . Anxiety   . Depression   . CHF (congestive heart failure)   . Anginal pain   . Type II diabetes mellitus   . Umbilical hernia   . GERD (gastroesophageal reflux disease)     "one dr says yes; one says no"  . Arthritis     "just about q where"  . Claustrophobia     Social History Social History  Substance Use Topics  . Smoking status: Current  Some Day Smoker -- 0.50 packs/day for 45 years    Types: Cigarettes    Start date: 01/06/1959  . Smokeless tobacco: Never Used  . Alcohol Use: No    Family History Family History  Problem Relation Age of Onset  . Cancer Maternal Grandmother     liver  . Cancer Maternal Grandfather     lung  . Diabetes Mother   . Diabetes Son     Past Surgical History  Procedure Laterality Date  . Parotidectomy Left   . Cystectomy      "2 off my right posterior shoulder; 2 off my left neck"  . Appendectomy  05/1975  . Aortogram      with bilateral lower extremity run-off  . Colonoscopy  12/31/2010     RUE:AVWU tortuous, but otherwise normal-appearing colon/ Normal rectum  . Esophagogastroduodenoscopy (egd) with propofol N/A 05/16/2014    JWJ:[id number] inlet patch otherwise normal/s/p dilation as described above. Antral erosions and a single gastric ulcer s/p gastric bx. mild chronic gastritis. no h.pylori  . Esophageal dilation N/A 05/16/2014    Procedure: ESOPHAGEAL DILATION;  Surgeon: Corbin Ade, MD;  Location: [facility];  Service: Endoscopy;  Laterality: N/A;  #56 maloney  . Esophageal biopsy N/A 05/16/2014    Procedure: BIOPSY of stomach;  Surgeon: Corbin Ade, MD;  Location: [facility];  Service: Endoscopy;  Laterality: N/A;  . Abdominal angiogram N/A 11/12/2011    Procedure: ABDOMINAL ANGIOGRAM;  Surgeon: Sherren Kerns, MD;  Location: [facility] CATH LAB;  Service: Cardiovascular;  Laterality: N/A;  . Visceral angiogram  11/12/2011    Procedure: VISCERAL ANGIOGRAM;  Surgeon: Sherren Kerns, MD;  Location: [facility] CATH LAB;  Service: Cardiovascular;;  . Lower extremity angiogram Bilateral 11/12/2011    Procedure: LOWER EXTREMITY ANGIOGRAM;  Surgeon: Sherren Kerns, MD;  Location: [facility] CATH LAB;  Service: Cardiovascular;  Laterality: Bilateral;  . Left heart catheterization with coronary angiogram N/A 11/13/2013    Procedure: LEFT HEART CATHETERIZATION WITH CORONARY ANGIOGRAM;  Surgeon: Rollene Rotunda, MD;  Location: [facility] CATH LAB;  Service: Cardiovascular;  Laterality: N/A;  . Abdominal aortagram N/A 07/19/2014    Procedure: ABDOMINAL Ronny Flurry;  Surgeon: Sherren Kerns, MD;  Location: [facility] CATH LAB;  Service: Cardiovascular;  Laterality: N/A;  . Cardiac catheterization  X 3  . Coronary angioplasty  01/22/2015  . Tonsillectomy and adenoidectomy    . Abdominal hysterectomy  05/1966    "partial w/left tube & ovary"  . Salpingoophorectomy Right 05/1966    "few days after hyster"  . Percutaneous coronary stent intervention (pci-s) N/A 01/22/2015    Procedure: PERCUTANEOUS CORONARY STENT INTERVENTION  (PCI-S);  Surgeon: Corky Crafts, MD;  Location: [facility] CATH LAB;  Service: Cardiovascular;  Laterality: N/A;  . Cardiac catheterization  01/22/2015    Procedure: CORONARY/BYPASS GRAFT CTO INTERVENTION;  Surgeon: Corky Crafts, MD;  Location: [facility] CATH LAB;  Service: Cardiovascular;;    Allergies  Allergen Reactions  . Penicillins Anaphylaxis  . Sulfonamide Derivatives Anaphylaxis  . Alupent [Metaproterenol Sulfate] Other (See Comments)    Makes pt "nervous"  . Azithromycin Other (See Comments)    unknown  . Linzess [Linaclotide] Nausea And Vomiting  . Omnipaque [Iohexol] Other (See Comments)    Pt states can take mix Dye and Metformin together.  Doesn't have SOB or rash  . Pantoprazole Nausea And Vomiting  . Paroxetine Other (See Comments)    unknown  . Varenicline Tartrate Other (See Comments)    unknown  .  Benadryl [Diphenhydramine Hcl] Other (See Comments)    Can't breath  . Hydrocortisone Hives  . Prednisone Hives    Current Outpatient Prescriptions  Medication Sig Dispense Refill  . albuterol (2.5 MG/3ML) 0.083% NEBU 3 mL, albuterol (5 MG/ML) 0.5% NEBU 0.5 mL Inhale 2.5 mg into the lungs every 6 (six) hours as needed (congestion).     Marland Kitchen albuterol (VENTOLIN HFA) 108 (90 BASE) MCG/ACT inhaler Inhale 2 puffs into the lungs every 6 (six) hours as needed for wheezing or shortness of breath.     . ALPRAZolam (XANAX) 1 MG tablet Take 0.5-1 mg by mouth 4 (four) times daily as needed for anxiety. For nerves    . amLODipine (NORVASC) 5 MG tablet Take 1 tablet (5 mg total) by mouth daily. 90 tablet 3  . aspirin 81 MG tablet Take 81 mg by mouth daily.      . clopidogrel (PLAVIX) 75 MG tablet Take 1 tablet (75 mg total) by mouth daily with breakfast. 90 tablet 3  . Dextromethorphan-Guaifenesin (DIABETIC TUSSIN DM MAX ST PO) Take by mouth as needed. Per phone call from patient after office visit 03-27-15    . furosemide (LASIX) 20 MG tablet Take 40 mg by mouth daily as needed for  fluid. Per patient is taking 40 mg    . metFORMIN (GLUCOPHAGE) 500 MG tablet Take 500 mg by mouth 2 (two) times daily.     . metoprolol succinate (TOPROL-XL) 25 MG 24 hr tablet Take 12.5 mg by mouth every morning.     . Multiple Vitamins-Minerals (MULTIVITAMIN PO) Take by mouth daily.    . nitroGLYCERIN (NITROSTAT) 0.4 MG SL tablet Place 1 tablet (0.4 mg total) under the tongue every 5 (five) minutes as needed. For chest pain. 25 tablet 3  . oxyCODONE-acetaminophen (ENDOCET) 10-325 MG per tablet Take 0.5-1 tablets by mouth every 4 (four) hours as needed for pain.     . potassium chloride (KLOR-CON) 10 MEQ CR tablet Take 10 mEq by mouth as needed. When taking Furosemide    . pravastatin (PRAVACHOL) 80 MG tablet Take 80 mg by mouth daily.    Rocco Pauls (EYE DROPS ADVANCED RELIEF OP) Apply 1 drop to eye daily as needed (dry eyes).    Marland Kitchen guaifenesin (ROBITUSSIN) 100 MG/5ML syrup Take 200 mg by mouth 3 (three) times daily as needed for cough.    . simvastatin (ZOCOR) 40 MG tablet Take 1 tablet (40 mg total) by mouth every evening. (Patient not taking: Reported on 08/12/2015) 90 tablet 3   No current facility-administered medications for this visit.    ROS: See HPI for pertinent positives and negatives.   Physical Examination  Filed Vitals:   08/12/15 1454  BP: 126/62  Pulse: 70  Temp: 97.1 F (36.2 C)  Resp: 14  Height: 5\' 3"  (1.6 m)  Weight: 104 lb (47.174 kg)  SpO2: 100%   Body mass index is 18.43 kg/(m^2).  General: A&O x 3, WDWN. Gait: normal Eyes: PERRLA. Pulmonary: limited air movement in all fields with wheezes , no rales or rhonchi. Cardiac: regular rhythm, no detected murmur.         Carotid Bruits Right Left   Negative Positive  Aorta is  palpable. Radial pulses: 2+ palpable and =                           VASCULAR EXAM: Extremities without ischemic changes, without Gangrene; without open wounds. Left foot is as warm  as the right foot. Left foot  has dependent rubor.                                                                                                          LE Pulses Right Left       FEMORAL  3+ palpable  3+ palpable        POPLITEAL  not palpable   not palpable       POSTERIOR TIBIAL  not palpable, monophasic by Doppler   not palpable, monophasic by Doppler        DORSALIS PEDIS      ANTERIOR TIBIAL faintly palpable  not palpable, monophasic by Doppler    Abdomen: soft, NT, no palpable masses. Skin: no rashes, no ulcers. Musculoskeletal: no muscle wasting or atrophy.  Neurologic: A&O X 3; Appropriate Affect,; MOTOR FUNCTION:  moving all extremities equally, motor strength 5/5 throughout. Speech is fluent/normal. CN 2-12 intact. Loquacious.     Non-Invasive Vascular Imaging: DATE: 08/12/2015 ABI (Date: 08/12/2015)  R: 0.54 (0.37, 03/27/15), DP: monophasic, PT: monophasic, TBI: 0.22  L: 0.32 (0.00), DP: monophasic, PT: monophasic, TBI: dampened waveform   ASSESSMENT: Melissa Holland is a 69 y.o. female with known severe PAD. She returns today with c/o swelling and occasional pain in her left foot after her left foot was stepped on about 3 weeks ago. She has no ulcers and small abrasions that she had are healing on her left foot.  She had an arteriogram in August of 2015. This showed bilateral high grade common femoral stenoses as well as bilateral superficial femoral artery occlusions. She was offered femoral popliteal bypass at that time but refused. She has previously had a left leg vein stripping.   Today's ABI's are improved compared to 03/27/15; on that date her left ABI was 0.00, today it is 0.32 (severe arterial occlusive disease) and right LE indicates moderate arterial occlusive disease.  Unfortunately she continues to smoke.   Face to face time with patient was 25 minutes. Over 50% of this time was spent on counseling and coordination of care.   PLAN:  The patient was counseled re smoking  cessation and given several free resources re smoking cessation.  Based on the patient's vascular studies and examination, pt will return to clinic in 2 days and see me, Dr. Darrick Penna is in the office this day; if pt cannot get a ride in 2 days, then return next week to see me on a day that Dr. Darrick Penna is in the office. She knows to notify our office if she develops non healing wounds in her feet or lower legs, or pain in her legs becomes worse.  I discussed in depth with the patient the nature of atherosclerosis, and emphasized the importance of maximal medical management including strict control of blood pressure, blood glucose, and lipid levels, obtaining regular exercise, and cessation of smoking.  The patient is aware that without maximal medical management the underlying atherosclerotic disease process will progress, limiting the benefit of any interventions.  The patient was given information  about PAD including signs, symptoms, treatment, what symptoms should prompt the patient to seek immediate medical care, and risk reduction measures to take.  Charisse March, RN, MSN, FNP-C [facility] [place] Phone: [phone]  Clinic MD: Myra Gianotti on call  08/12/2015 3:16 PM

## 2015-08-21 ENCOUNTER — Ambulatory Visit: Payer: Medicare Other | Admitting: Family

## 2015-08-28 ENCOUNTER — Telehealth: Payer: Self-pay | Admitting: Cardiology

## 2015-08-28 NOTE — Telephone Encounter (Signed)
This patient was sent a letter due to missing her last appointment. Please contact this patient to reschedule.

## 2015-08-28 NOTE — Telephone Encounter (Signed)
Please call patient in reference to her getting a letter stating to contact our office?? I could not find anything in reference to this.

## 2015-09-04 ENCOUNTER — Encounter (HOSPITAL_COMMUNITY): Payer: Medicare Other

## 2015-09-04 ENCOUNTER — Ambulatory Visit: Payer: Medicare Other | Admitting: Family

## 2015-09-11 ENCOUNTER — Ambulatory Visit: Payer: Medicare Other | Admitting: Vascular Surgery

## 2015-09-22 ENCOUNTER — Ambulatory Visit (INDEPENDENT_AMBULATORY_CARE_PROVIDER_SITE_OTHER): Payer: Medicare Other | Admitting: Cardiology

## 2015-09-22 ENCOUNTER — Encounter: Payer: Self-pay | Admitting: Cardiology

## 2015-09-22 VITALS — BP 147/64 | HR 54 | Ht 63.0 in | Wt 101.8 lb

## 2015-09-22 DIAGNOSIS — I739 Peripheral vascular disease, unspecified: Secondary | ICD-10-CM

## 2015-09-22 DIAGNOSIS — I2510 Atherosclerotic heart disease of native coronary artery without angina pectoris: Secondary | ICD-10-CM

## 2015-09-22 MED ORDER — AMLODIPINE BESYLATE 10 MG PO TABS: 10.0000 mg | ORAL_TABLET | Freq: Every day | ORAL | Status: DC

## 2015-09-22 NOTE — Progress Notes (Signed)
Patient ID: Melissa Holland, female   DOB: August 24, 1946, 69 y.o.   MRN: [id number]     Clinical Summary Melissa Holland is a 69 y.o.female seen today for follow up of the following medical problems.   1. CAD  - most recent cath 12/2014 with LM mild ostial disease, LAD mid 50%, LCX diffuse disease and occluded in mid vessel, there are left to left collaterals which reconstitute the OM1 and distal LCX, RCA CTO, PDA fills by collateral. The LCX lesion was ballooned, not able to stent the area.   - notes mild chest pain at times, mild aching pain. Can be better with tums or sprite.   2. HL  - compliant with statin   3. PAD/Carotid disease  - recent lower extremity angio showing bilateral superficial femoral artery occlusions, bilateral common femoral artery stenosis 70%.  - she has been offered a right common femoral endarterectomy and right femoral to below knee pop bypass but refused.  - carotid stenosis is also followed by vascular   Past Medical History  Diagnosis Date  . Coronary artery disease     a. Severe single-vessel/100% occluded right with distal collateralization.Medical therapy/Normal LV fx b. cath 01/22/2015 chronically occluded RCA, LVEDP , balloon angioplasty to occluded mid LCx   . Carotid bruit     Bilateral  . Sinus bradycardia   . Dyslipidemia   . DVT (deep venous thrombosis)     Remote portable as him   . COPD (chronic obstructive pulmonary disease)   . Leg pain   . Productive cough   . Peripheral arterial disease   . Carotid artery occlusion   . Anxiety   . Depression   . CHF (congestive heart failure)   . Anginal pain   . Type II diabetes mellitus   . Umbilical hernia   . GERD (gastroesophageal reflux disease)     "one dr says yes; one says no"  . Arthritis     "just about q where"  . Claustrophobia      Allergies  Allergen Reactions  . Penicillins Anaphylaxis  . Sulfonamide Derivatives Anaphylaxis  . Alupent [Metaproterenol Sulfate]  Other (See Comments)    Makes pt "nervous"  . Azithromycin Other (See Comments)    unknown  . Linzess [Linaclotide] Nausea And Vomiting  . Omnipaque [Iohexol] Other (See Comments)    Pt states can take mix Dye and Metformin together.  Doesn't have SOB or rash  . Pantoprazole Nausea And Vomiting  . Paroxetine Other (See Comments)    unknown  . Varenicline Tartrate Other (See Comments)    unknown  . Benadryl [Diphenhydramine Hcl] Other (See Comments)    Can't breath  . Hydrocortisone Hives  . Prednisone Hives     Current Outpatient Prescriptions  Medication Sig Dispense Refill  . albuterol (2.5 MG/3ML) 0.083% NEBU 3 mL, albuterol (5 MG/ML) 0.5% NEBU 0.5 mL Inhale 2.5 mg into the lungs every 6 (six) hours as needed (congestion).     Marland Kitchen albuterol (VENTOLIN HFA) 108 (90 BASE) MCG/ACT inhaler Inhale 2 puffs into the lungs every 6 (six) hours as needed for wheezing or shortness of breath.     . ALPRAZolam (XANAX) 1 MG tablet Take 0.5-1 mg by mouth 4 (four) times daily as needed for anxiety. For nerves    . amLODipine (NORVASC) 5 MG tablet Take 1 tablet (5 mg total) by mouth daily. 90 tablet 3  . aspirin 81 MG tablet Take 81 mg by mouth daily.      Marland Kitchen  clopidogrel (PLAVIX) 75 MG tablet Take 1 tablet (75 mg total) by mouth daily with breakfast. 90 tablet 3  . Dextromethorphan-Guaifenesin (DIABETIC TUSSIN DM MAX ST PO) Take by mouth as needed. Per phone call from patient after office visit 03-27-15    . furosemide (LASIX) 20 MG tablet Take 40 mg by mouth daily as needed for fluid. Per patient is taking 40 mg    . guaifenesin (ROBITUSSIN) 100 MG/5ML syrup Take 200 mg by mouth 3 (three) times daily as needed for cough.    . metFORMIN (GLUCOPHAGE) 500 MG tablet Take 500 mg by mouth 2 (two) times daily.     . metoprolol succinate (TOPROL-XL) 25 MG 24 hr tablet Take 12.5 mg by mouth every morning.     . Multiple Vitamins-Minerals (MULTIVITAMIN PO) Take by mouth daily.    . nitroGLYCERIN (NITROSTAT) 0.4  MG SL tablet Place 1 tablet (0.4 mg total) under the tongue every 5 (five) minutes as needed. For chest pain. 25 tablet 3  . oxyCODONE-acetaminophen (ENDOCET) 10-325 MG per tablet Take 0.5-1 tablets by mouth every 4 (four) hours as needed for pain.     . potassium chloride (KLOR-CON) 10 MEQ CR tablet Take 10 mEq by mouth as needed. When taking Furosemide    . pravastatin (PRAVACHOL) 80 MG tablet Take 80 mg by mouth daily.    . simvastatin (ZOCOR) 40 MG tablet Take 1 tablet (40 mg total) by mouth every evening. (Patient not taking: Reported on 08/12/2015) 90 tablet 3  . Tetrahydroz-Dextran-PEG-Povid (EYE DROPS ADVANCED RELIEF OP) Apply 1 drop to eye daily as needed (dry eyes).     No current facility-administered medications for this visit.     Past Surgical History  Procedure Laterality Date  . Parotidectomy Left   . Cystectomy      "2 off my right posterior shoulder; 2 off my left neck"  . Appendectomy  05/1975  . Aortogram      with bilateral lower extremity run-off  . Colonoscopy  12/31/2010    WUJ:WJXB tortuous, but otherwise normal-appearing colon/ Normal rectum  . Esophagogastroduodenoscopy (egd) with propofol N/A 05/16/2014    JYN:[id number] inlet patch otherwise normal/s/p dilation as described above. Antral erosions and a single gastric ulcer s/p gastric bx. mild chronic gastritis. no h.pylori  . Esophageal dilation N/A 05/16/2014    Procedure: ESOPHAGEAL DILATION;  Surgeon: Corbin Ade, MD;  Location: [facility];  Service: Endoscopy;  Laterality: N/A;  #56 maloney  . Esophageal biopsy N/A 05/16/2014    Procedure: BIOPSY of stomach;  Surgeon: Corbin Ade, MD;  Location: [facility];  Service: Endoscopy;  Laterality: N/A;  . Abdominal angiogram N/A 11/12/2011    Procedure: ABDOMINAL ANGIOGRAM;  Surgeon: Sherren Kerns, MD;  Location: [facility] CATH LAB;  Service: Cardiovascular;  Laterality: N/A;  . Visceral angiogram  11/12/2011    Procedure: VISCERAL ANGIOGRAM;  Surgeon: Sherren Kerns,  MD;  Location: [facility] CATH LAB;  Service: Cardiovascular;;  . Lower extremity angiogram Bilateral 11/12/2011    Procedure: LOWER EXTREMITY ANGIOGRAM;  Surgeon: Sherren Kerns, MD;  Location: [facility] CATH LAB;  Service: Cardiovascular;  Laterality: Bilateral;  . Left heart catheterization with coronary angiogram N/A 11/13/2013    Procedure: LEFT HEART CATHETERIZATION WITH CORONARY ANGIOGRAM;  Surgeon: Rollene Rotunda, MD;  Location: [facility] CATH LAB;  Service: Cardiovascular;  Laterality: N/A;  . Abdominal aortagram N/A 07/19/2014    Procedure: ABDOMINAL Ronny Flurry;  Surgeon: Sherren Kerns, MD;  Location: [facility] CATH LAB;  Service: Cardiovascular;  Laterality: N/A;  . Cardiac catheterization  X 3  . Coronary angioplasty  01/22/2015  . Tonsillectomy and adenoidectomy    . Abdominal hysterectomy  05/1966    "partial w/left tube & ovary"  . Salpingoophorectomy Right 05/1966    "few days after hyster"  . Percutaneous coronary stent intervention (pci-s) N/A 01/22/2015    Procedure: PERCUTANEOUS CORONARY STENT INTERVENTION (PCI-S);  Surgeon: Corky Crafts, MD;  Location: [facility] CATH LAB;  Service: Cardiovascular;  Laterality: N/A;  . Cardiac catheterization  01/22/2015    Procedure: CORONARY/BYPASS GRAFT CTO INTERVENTION;  Surgeon: Corky Crafts, MD;  Location: [facility] CATH LAB;  Service: Cardiovascular;;     Allergies  Allergen Reactions  . Penicillins Anaphylaxis  . Sulfonamide Derivatives Anaphylaxis  . Alupent [Metaproterenol Sulfate] Other (See Comments)    Makes pt "nervous"  . Azithromycin Other (See Comments)    unknown  . Linzess [Linaclotide] Nausea And Vomiting  . Omnipaque [Iohexol] Other (See Comments)    Pt states can take mix Dye and Metformin together.  Doesn't have SOB or rash  . Pantoprazole Nausea And Vomiting  . Paroxetine Other (See Comments)    unknown  . Varenicline Tartrate Other (See Comments)    unknown  . Benadryl [Diphenhydramine Hcl] Other (See Comments)    Can't breath  .  Hydrocortisone Hives  . Prednisone Hives      Family History  Problem Relation Age of Onset  . Cancer Maternal Grandmother     liver  . Cancer Maternal Grandfather     lung  . Diabetes Mother   . Diabetes Son      Social History Melissa Holland reports that she has been smoking Cigarettes.  She started smoking about 56 years ago. She has a 22.5 pack-year smoking history. She has never used smokeless tobacco. Melissa Holland reports that she does not drink alcohol.   Review of Systems CONSTITUTIONAL: No weight loss, fever, chills, weakness or fatigue.  HEENT: Eyes: No visual loss, blurred vision, double vision or yellow sclerae.No hearing loss, sneezing, congestion, runny nose or sore throat.  SKIN: No rash or itching.  CARDIOVASCULAR: per HPI RESPIRATORY: No shortness of breath, cough or sputum.  GASTROINTESTINAL: No anorexia, nausea, vomiting or diarrhea. No abdominal pain or blood.  GENITOURINARY: No burning on urination, no polyuria NEUROLOGICAL: No headache, dizziness, syncope, paralysis, ataxia, numbness or tingling in the extremities. No change in bowel or bladder control.  MUSCULOSKELETAL: No muscle, back pain, joint pain or stiffness.  LYMPHATICS: No enlarged nodes. No history of splenectomy.  PSYCHIATRIC: No history of depression or anxiety.  ENDOCRINOLOGIC: No reports of sweating, cold or heat intolerance. No polyuria or polydipsia.  Marland Kitchen   Physical Examination Filed Vitals:   09/22/15 1620  BP: 147/64  Pulse: 54   Filed Vitals:   09/22/15 1620  Height: 5\' 3"  (1.6 m)  Weight: 101 lb 12.8 oz (46.176 kg)    Gen: resting comfortably, no acute distress HEENT: no scleral icterus, pupils equal round and reactive, no palptable cervical adenopathy,  CV: RRR, no m/r/g, no jvd. Bilateral carotid bruits Resp: Clear to auscultation bilaterally GI: abdomen is soft, non-tender, non-distended, normal bowel sounds, no hepatosplenomegaly MSK: extremities are warm, no edema.  Skin:  warm, no rash Neuro:  no focal deficits Psych: appropriate affect   Diagnostic Studies 10/2011 Echo  LVEF 55-60%, no WMAs   10/2013 Echo  LVEF 50-55%, hypokinesis of the distalanteroseptal wall, akinesis basal mid-inferolateral walls. Grade I diastolic dysfunction.   09/2012 Carotid  US:  IMPRESSION:  1. Heterogeneous calcified and noncalcified atherosclerotic plaque in the right carotid bulb and proximal right internal carotid artery results in a 50 - 69% diameter stenosis. No significant interval change in the degree of stenosis compared to 10/07/2010 although subjectively there is increased calcification of the plaque.  2. Interval progression of heterogeneous predominately calcified atherosclerotic plaque in the left carotid bulb and proximal internal carotid artery resulting in a greater than 70% diameter stenosis.  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 Abnormal lexiscan MPI. There is a fixed apical defect consistent with prior infarction. There is an inferolateral wall defect with mild peri-infarct ischemia, the interpretation of this defect is limited by significant subdiaphragmatic attenuation adjacent to inferolateral wall. There is hypokinesis of this area, so likely there is a true prior infarct. Interpretation of peri-infarct ischemia of this territory may be limited by subdiaphragmatic attenuation. Correlate findings clinically.  2. Decrased left ventricular systolic function  11/08/13 Clinic EKG  - initial ekg wandering baseline, repeated. Repeat EKG shows sinus rhythm, RBBB, old TWI inferior leads.   10/2013 Cath  Left mainstem: Heavy calcification. No high grade disease  Left anterior descending (LAD): Long proximal 50%. I have compared this lesion to the 2008 films and it appears to be unchanged. The LAD is a very large vessel wrapping the apex. The mid LAD has 30%  stenosis. There are diffuse luminal irregularities. This is a mid to distal moderate to large diagonal vessel that has mild luminal irregularities.  Left circumflex (LCx): The circumflex has had worsening stenosis with now occlusion at the ostium of a large branching OM. This OM has severe diffuse disease proximal to the bifurcation. The vessel after this is free of high grade disease.  Right coronary artery (RCA): Occluded in the mid segment.  Left ventriculography: Left ventricular systolic function is mildly reduced with , LVEF is estimated at 50%, there is no significant mitral regurgitation  Final Conclusions: Two vessel occlusion with moderate disease in the proximal LAD.  Recommendations: The patient would need to be considered for CABG if she has continued chest pain. Discussed with Dr. Wyline MoodBranch.    11/14/15 Lexiscan MPI 1. Large area of anterolateral wall ischemia extending from the apex to the base.  2. The anterolateral wall is severely hypokinetic to akinetic.  3. Left ventricular ejection fraction 38%  4. High-risk stress test findings*.  07/2015   Assessment and Plan   1. CAD - symptoms improved since recent PTCA. She has long history of atypical chest pain as well that can be difficult to distinguish, current symptoms better with tums and sprite - continue current meds. Will try increasing norvasc as additonal antianginal and follow symptoms.   2. Hyperlipidemia - continue current statin  3. PAD - continue to follow with vascular.       F/u 3 months    Antoine PocheJonathan F. Branch, M.D.

## 2015-09-22 NOTE — Patient Instructions (Signed)
Your physician recommends that you schedule a follow-up appointment in: 3 MONTHS WITH DR. BRANCH  Your physician has recommended you make the following change in your medication:   INCREASE NORVASC 10 MG DAILY - WE HAVE GIVEN YOU A PRINTED RX  Thank you for choosing [facility]!!

## 2015-09-23 ENCOUNTER — Telehealth: Payer: Self-pay

## 2015-09-23 NOTE — Telephone Encounter (Signed)
Patient walked in wanting to speak with nurse about her medications.  Two on her list should be removed.  Also wanted to talk about Pravastatin

## 2015-09-23 NOTE — Telephone Encounter (Signed)
[facility], could not get anyone to help. Will try tomorrow

## 2015-09-24 NOTE — Telephone Encounter (Signed)
Attempted to call again and hotel says no room # 169 and could not find out what room she was in and was not willing to help. Will call back after lunch.

## 2015-09-25 NOTE — Telephone Encounter (Signed)
Unable to reach pt. LM with hotel staff.

## 2015-09-25 NOTE — Addendum Note (Signed)
Addended by: Burman NievesASHWORTH, Hannia Matchett T on: 09/25/2015 03:08 PM   Modules accepted: Orders, Medications

## 2015-09-25 NOTE — Telephone Encounter (Signed)
Also wanted Robitussin taken off med list, no longer taking. Updated medication list.

## 2015-09-25 NOTE — Telephone Encounter (Signed)
Pt returned call Pravastatin was stopped by pt 08/28/15 due to side effects, staying weak and losing weak.

## 2015-10-08 ENCOUNTER — Encounter: Payer: Self-pay | Admitting: Internal Medicine

## 2015-10-08 ENCOUNTER — Telehealth: Payer: Self-pay | Admitting: Gastroenterology

## 2015-10-08 NOTE — Telephone Encounter (Signed)
APPT MADE AND LETTER SENT  °

## 2015-10-08 NOTE — Telephone Encounter (Signed)
Patient is due for OV with RMR only. Constipation and biliary dilation/pancreatic cystic lesion. Please arrange.

## 2015-10-09 ENCOUNTER — Telehealth: Payer: Self-pay

## 2015-10-09 NOTE — Telephone Encounter (Signed)
Finally, talked to Melissa Holland, she is c/o left leg pain, redness and swelling "just like it was a couple of months ago, no better" She states that " she got 2 places healed up on her leg but there is still 1 place that won't get better" Melissa Holland is very tearful and keeps repeating the same information even though I ask her different questions. She does not think that she has had any fever or chills but "doesn't have anybody around that cares about her to check".  Melissa Holland had appts in October for vascular labs and NP follow up but cancelled because "that nurse practioner is very cold and doesn't care about me at all"  Melissa. Melissa Holland refuses to see any one but Melissa Holland "because he saved my life". I will get our appt secretary to call Melissa Holland to reschedule these appts asap with Melissa Holland. Melissa. Melissa Holland is again crying and states "that she doesn't know if her son will bring her to these appts but she will try and get someone to bring her to see Melissa Holland". I told Melissa Holland that it may be several weeks before she can get appt with Melissa Holland but she says that she will wait because "he is the only one who cares about her".  Melissa Holland states that she has an appt with her PCP, Melissa Holland, on 10-21-15 and I have encouraged her to keep this appt. Melissa Holland tearfully voiced understanding and agreement with this plan.

## 2015-10-09 NOTE — Telephone Encounter (Signed)
Attempted to call pt.  [facility]; no answer @ the front desk of the [facility] @ [phone];[address]; unable to leave a message.

## 2015-10-10 NOTE — Telephone Encounter (Signed)
I have made an appointment for Ms Melissa Holland, but [facility]. No way of leaving a message. Will try again at a later time. dpm

## 2015-10-13 NOTE — Telephone Encounter (Signed)
I connected with Ms Melissa Holland today, I asked her to hold for a brief moment while my computer loaded (EPIC went down during our conversation) but she hung up. When I called back, no answer. dpm

## 2015-10-13 NOTE — Telephone Encounter (Signed)
Finally spoke with Melissa Holland- offered her the next appointment with Dr Darrick PennaFields which is 11/13/15- pt started crying and stating that her leg might fall off.... Again I offered her the option of seeing our NP earlier than 12/15 but she refused stating Dr Darrick PennaFields is the only person that she wants to see. I have placed her on the cancellation list as well.

## 2015-10-28 ENCOUNTER — Encounter: Payer: Self-pay | Admitting: Internal Medicine

## 2015-10-28 ENCOUNTER — Ambulatory Visit (INDEPENDENT_AMBULATORY_CARE_PROVIDER_SITE_OTHER): Payer: Medicare Other | Admitting: Internal Medicine

## 2015-10-28 ENCOUNTER — Telehealth: Payer: Self-pay | Admitting: Internal Medicine

## 2015-10-28 VITALS — BP 109/61 | HR 61 | Temp 98.2°F | Ht 63.0 in | Wt 103.0 lb

## 2015-10-28 DIAGNOSIS — I69891 Dysphagia following other cerebrovascular disease: Secondary | ICD-10-CM

## 2015-10-28 DIAGNOSIS — K5900 Constipation, unspecified: Secondary | ICD-10-CM

## 2015-10-28 DIAGNOSIS — I2510 Atherosclerotic heart disease of native coronary artery without angina pectoris: Secondary | ICD-10-CM | POA: Diagnosis not present

## 2015-10-28 NOTE — Telephone Encounter (Signed)
Charted in immunizations.

## 2015-10-28 NOTE — Patient Instructions (Signed)
Take OTC stool sofener/laxative daily to every other day for a BM ever day or every other day.  Office visit with us in 3 months

## 2015-10-28 NOTE — Progress Notes (Signed)
Primary Care Physician:  Fredirick Maudlin, MD Primary Gastroenterologist:  Dr. [name]  Pre-Procedure History & Physical: HPI:  Melissa Holland is a 69 y.o. female here for followup of abdominal pain and dysphagia. Patient likely has an underlying esophageal motility disorder. Dysphagia has improved recently. Constipation improved the most on patient's and over-the-counter stool softener/laxative regimen only takes a couple times weekly and has only a couple bowel movements weekly. Not passing any blood. Denies early satiety. She has lost 10 pounds since she was last year. Concerned about her left lower extremity circulation. She did have a CTA abdomen arrange a biopsy which revealed no significant occlusive mesenteric arterial disease.  Past Medical History  Diagnosis Date  . Coronary artery disease     a. Severe single-vessel/100% occluded right with distal collateralization.Medical therapy/Normal LV fx b. cath 01/22/2015 chronically occluded RCA, LVEDP , balloon angioplasty to occluded mid LCx   . Carotid bruit     Bilateral  . Sinus bradycardia   . Dyslipidemia   . DVT (deep venous thrombosis) (HCC)     Remote portable as him   . COPD (chronic obstructive pulmonary disease) (HCC)   . Leg pain   . Productive cough   . Peripheral arterial disease (HCC)   . Carotid artery occlusion   . Anxiety   . Depression   . CHF (congestive heart failure) (HCC)   . Anginal pain (HCC)   . Type II diabetes mellitus (HCC)   . Umbilical hernia   . GERD (gastroesophageal reflux disease)     "one dr says yes; one says no"  . Arthritis     "just about q where"  . Claustrophobia     Past Surgical History  Procedure Laterality Date  . Parotidectomy Left   . Cystectomy      "2 off my right posterior shoulder; 2 off my left neck"  . Appendectomy  05/1975  . Aortogram      with bilateral lower extremity run-off  . Colonoscopy  12/31/2010    GNF:AOZH tortuous, but otherwise  normal-appearing colon/ Normal rectum  . Esophagogastroduodenoscopy (egd) with propofol N/A 05/16/2014    YQM:VHQIONGEXB inlet patch otherwise normal/s/p dilation as described above. Antral erosions and a single gastric ulcer s/p gastric bx. mild chronic gastritis. no h.pylori  . Esophageal dilation N/A 05/16/2014    Procedure: ESOPHAGEAL DILATION;  Surgeon: Corbin Ade, MD;  Location: [facility];  Service: Endoscopy;  Laterality: N/A;  #56 maloney  . Esophageal biopsy N/A 05/16/2014    Procedure: BIOPSY of stomach;  Surgeon: Corbin Ade, MD;  Location: [facility];  Service: Endoscopy;  Laterality: N/A;  . Abdominal angiogram N/A 11/12/2011    Procedure: ABDOMINAL ANGIOGRAM;  Surgeon: Sherren Kerns, MD;  Location: [facility] CATH LAB;  Service: Cardiovascular;  Laterality: N/A;  . Visceral angiogram  11/12/2011    Procedure: VISCERAL ANGIOGRAM;  Surgeon: Sherren Kerns, MD;  Location: [facility] CATH LAB;  Service: Cardiovascular;;  . Lower extremity angiogram Bilateral 11/12/2011    Procedure: LOWER EXTREMITY ANGIOGRAM;  Surgeon: Sherren Kerns, MD;  Location: [facility] CATH LAB;  Service: Cardiovascular;  Laterality: Bilateral;  . Left heart catheterization with coronary angiogram N/A 11/13/2013    Procedure: LEFT HEART CATHETERIZATION WITH CORONARY ANGIOGRAM;  Surgeon: Rollene Rotunda, MD;  Location: [facility] CATH LAB;  Service: Cardiovascular;  Laterality: N/A;  . Abdominal aortagram N/A 07/19/2014    Procedure: ABDOMINAL Ronny Flurry;  Surgeon: Sherren Kerns, MD;  Location: [facility] CATH LAB;  Service: Cardiovascular;  Laterality: N/A;  . Cardiac catheterization  X 3  . Coronary angioplasty  01/22/2015  . Tonsillectomy and adenoidectomy    . Abdominal hysterectomy  05/1966    "partial w/left tube & ovary"  . Salpingoophorectomy Right 05/1966    "few days after hyster"  . Percutaneous coronary stent intervention (pci-s) N/A 01/22/2015    Procedure: PERCUTANEOUS CORONARY STENT INTERVENTION (PCI-S);  Surgeon: Corky CraftsJayadeep S  Varanasi, MD;  [facility];  Service: Cardiovascular;  Laterality: N/A;  . Cardiac catheterization  01/22/2015    Procedure: CORONARY/BYPASS GRAFT CTO INTERVENTION;  Surgeon: Corky CraftsJayadeep S Varanasi, MD;  [facility];  Service: Cardiovascular;;    Prior to Admission medications   Medication Sig Start Date End Date Taking? Authorizing Provider  albuterol (2.5 MG/3ML) 0.083% NEBU 3 mL, albuterol (5 MG/ML) 0.5% NEBU 0.5 mL Inhale 2.5 mg into the lungs every 6 (six) hours as needed (congestion).    Yes Historical Provider, MD  albuterol (VENTOLIN HFA) 108 (90 BASE) MCG/ACT inhaler Inhale 2 puffs into the lungs every 6 (six) hours as needed for wheezing or shortness of breath.    Yes Historical Provider, MD  ALPRAZolam Prudy Feeler(XANAX) 1 MG tablet Take 0.5-1 mg by mouth 4 (four) times daily as needed for anxiety. For nerves   Yes Historical Provider, MD  amLODipine (NORVASC) 10 MG tablet Take 1 tablet (10 mg total) by mouth daily. 09/22/15  Yes Antoine PocheJonathan F Branch, MD  aspirin 81 MG tablet Take 81 mg by mouth daily.     Yes Historical Provider, MD  clopidogrel (PLAVIX) 75 MG tablet Take 1 tablet (75 mg total) by mouth daily with breakfast. 01/24/15  Yes Corky CraftsJayadeep S Varanasi, MD  Dextromethorphan-Guaifenesin (DIABETIC TUSSIN DM MAX ST PO) Take by mouth as needed. Per phone call from patient after office visit 03-27-15   Yes Historical Provider, MD  furosemide (LASIX) 20 MG tablet Take 40 mg by mouth daily as needed for fluid. Per patient is taking 40 mg   Yes Historical Provider, MD  metFORMIN (GLUCOPHAGE) 500 MG tablet Take 500 mg by mouth 2 (two) times daily.    Yes Historical Provider, MD  metoprolol succinate (TOPROL-XL) 25 MG 24 hr tablet Take 12.5 mg by mouth every morning.    Yes Historical Provider, MD  Multiple Vitamins-Minerals (MULTIVITAMIN PO) Take by mouth daily.   Yes Historical Provider, MD  nitroGLYCERIN (NITROSTAT) 0.4 MG SL tablet Place 1 tablet (0.4 mg total) under the tongue every 5  (five) minutes as needed. For chest pain. 04/06/12  Yes Rande BruntEugene C Serpe, PA-C  oxyCODONE-acetaminophen (ENDOCET) 10-325 MG per tablet Take 0.5-1 tablets by mouth every 4 (four) hours as needed for pain.    Yes Historical Provider, MD  potassium chloride (KLOR-CON) 10 MEQ CR tablet Take 10 mEq by mouth as needed. When taking Furosemide   Yes Historical Provider, MD  Tetrahydroz-Dextran-PEG-Povid (EYE DROPS ADVANCED RELIEF OP) Apply 1 drop to eye daily as needed (dry eyes).   Yes Historical Provider, MD    Allergies as of 10/28/2015 - Review Complete 10/28/2015  Allergen Reaction Noted  . Penicillins Anaphylaxis   . Sulfonamide derivatives Anaphylaxis   . Alupent [metaproterenol sulfate] Other (See Comments) 09/10/2011  . Azithromycin Other (See Comments)   . Linzess [linaclotide] Nausea And Vomiting 05/06/2014  . Omnipaque [iohexol] Other (See Comments) 09/10/2011  . Pantoprazole Nausea And Vomiting 07/19/2014  . Paroxetine Other (See Comments) 04/09/2010  . Varenicline tartrate Other (See Comments) 04/09/2010  . Benadryl [  diphenhydramine hcl] Other (See Comments) 03/20/2012  . Hydrocortisone Hives 03/20/2012  . Prednisone Hives 03/20/2012    Family History  Problem Relation Age of Onset  . Cancer Maternal Grandmother     liver  . Cancer Maternal Grandfather     lung  . Diabetes Mother   . Diabetes Son     Social History   Social History  . Marital Status: Widowed    Spouse Name: N/A  . Number of Children: N/A  . Years of Education: N/A   Occupational History  . Not on file.   Social History Main Topics  . Smoking status: Current Some Day Smoker -- 0.50 packs/day for 45 years    Types: Cigarettes    Start date: 01/06/1959  . Smokeless tobacco: Never Used  . Alcohol Use: No  . Drug Use: No  . Sexual Activity: No   Other Topics Concern  . Not on file   Social History Narrative    Review of Systems: See HPI, otherwise negative ROS  Physical Exam: BP 109/61 mmHg   Pulse 61  Temp(Src) 98.2 F (36.8 C) (Oral)  Ht  (1.6 m)  Wt 103 lb (46.72 kg)  BMI 18.25 kg/m2 General:   Alert,  Well-developed, well-nourished, pleasant and cooperative in NAD Skin:  Intact without significant lesions or rashes. Eyes:  Sclera clear, no icterus.   Conjunctiva pink. Ears:  Normal auditory acuity. Nose:  No deformity, discharge,  or lesions. Mouth:  No deformity or lesions. Neck:  Supple; no masses or thyromegaly. No significant cervical adenopathy. Lungs:  Clear throughout to auscultation.   No wheezes, crackles, or rhonchi. No acute distress. Heart:  Regular rate and rhythm; no murmurs, clicks, rubs,  or gallops. Abdomen: Non-distended,Positive bowel sounds.  She has lax abdominal wall muscle tone and easily reducible umbilical hernia. Abdomen is soft and nontender without appreciable mass or organomegaly otherwise.  Extremity: Left lower history of chronic venous stasis changes and a partially healed pretibial ulcer.   Impression:  68 year old lady with multiple medical problems. Dysphagia previously felt to be due to an underlying esophageal motility disorder. Symptoms not that much of an issue these days. No significant reflux symptoms.  Chronic constipation improved considerably  With over-the-counter stool softener/laxative preparation. She was intolerant to a number of agents tried previously. Stable pancreatic and biliary ductal abnormalities likely result of chronic therapy - deserves no further evaluate this time.  Await last noted. This is of uncertain significance. No evidence of mesenteric ischemia on imaging.  Recommendations:  Take OTC stool sofener/laxative daily to every other day for a BM ever day to every other day or at least 3 times a week.  Chew food thoroughly and take a good 30 minutes to consume a meal.  Office visit with [place] in 3 months      Notice: This dictation was prepared with Dragon dictation along with smaller phrase  technology. Any transcriptional errors that result from this process are unintentional and may not be corrected upon review.

## 2015-10-28 NOTE — Telephone Encounter (Signed)
Patient called and stated that she got her flu shot 10/21/15

## 2015-10-31 ENCOUNTER — Telehealth: Payer: Self-pay | Admitting: Cardiology

## 2015-10-31 NOTE — Telephone Encounter (Signed)
amLODipine (NORVASC) 10 MG tablet Having headaches so she is cutting dose back to 5mg  from 10mg 

## 2015-11-03 MED ORDER — AMLODIPINE BESYLATE 5 MG PO TABS: 5.0000 mg | ORAL_TABLET | Freq: Every day | ORAL | Status: AC

## 2015-11-03 NOTE — Telephone Encounter (Signed)
Can continue lower dose of norvasc if her headaches are better   Dominga FerryJ Shaun Runyon MD

## 2015-11-03 NOTE — Addendum Note (Signed)
Addended by: Burman NievesASHWORTH, STACI T on: 11/03/2015 10:37 AM   Modules accepted: Orders, Medications

## 2015-11-03 NOTE — Telephone Encounter (Signed)
Pt says she has not had a headache since decreasing amlodipine 5 mg on Friday. Says she has been keeping BP (she didn't have readings with her) but says BP has been WNL. Will forward to Dr Wyline MoodBranch as Lorain ChildesFYI

## 2015-11-03 NOTE — Telephone Encounter (Signed)
Medication list updated.

## 2015-11-07 ENCOUNTER — Encounter: Payer: Self-pay | Admitting: Vascular Surgery

## 2015-11-13 ENCOUNTER — Ambulatory Visit (HOSPITAL_COMMUNITY)
Admission: RE | Admit: 2015-11-13 | Discharge: 2015-11-13 | Disposition: A | Discharge status: Home / Self Care | Payer: Medicare Other | Source: Ambulatory Visit | Attending: Vascular Surgery | Admitting: Vascular Surgery

## 2015-11-13 ENCOUNTER — Other Ambulatory Visit: Payer: Self-pay | Admitting: Vascular Surgery

## 2015-11-13 ENCOUNTER — Encounter: Payer: Self-pay | Admitting: Vascular Surgery

## 2015-11-13 ENCOUNTER — Ambulatory Visit (INDEPENDENT_AMBULATORY_CARE_PROVIDER_SITE_OTHER): Payer: Medicare Other | Admitting: Vascular Surgery

## 2015-11-13 ENCOUNTER — Ambulatory Visit (INDEPENDENT_AMBULATORY_CARE_PROVIDER_SITE_OTHER)
Admission: RE | Admit: 2015-11-13 | Discharge: 2015-11-13 | Disposition: A | Discharge status: Home / Self Care | Payer: Medicare Other | Source: Ambulatory Visit | Attending: Vascular Surgery | Admitting: Vascular Surgery

## 2015-11-13 VITALS — BP 116/64 | HR 62 | Temp 97.3°F | Resp 14 | Ht 63.0 in | Wt 104.0 lb

## 2015-11-13 DIAGNOSIS — I739 Peripheral vascular disease, unspecified: Secondary | ICD-10-CM

## 2015-11-13 DIAGNOSIS — I6523 Occlusion and stenosis of bilateral carotid arteries: Secondary | ICD-10-CM | POA: Diagnosis not present

## 2015-11-13 DIAGNOSIS — I2510 Atherosclerotic heart disease of native coronary artery without angina pectoris: Secondary | ICD-10-CM | POA: Diagnosis not present

## 2015-11-13 DIAGNOSIS — Z48812 Encounter for surgical aftercare following surgery on the circulatory system: Secondary | ICD-10-CM | POA: Diagnosis not present

## 2015-11-13 NOTE — Progress Notes (Signed)
History of Present Illness:  Patient is a 69 y.o. year old female who presents for evaluation of bilateral carotid stenosis and PAD bilateral LE.  She has no open wounds at this time, she sis have left anterior shin wounds that she treated herself and has since then healed.  No changes in vision, speech or weakness on one side of the body.  She does report exertional CP on and off.  She has an up coming apointment with Dr. Wyline Mood her cardiologist.  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  Past Medical History  Diagnosis Date  . Coronary artery disease     a. Severe single-vessel/100% occluded right with distal collateralization.Medical therapy/Normal LV fx b. cath 01/22/2015 chronically occluded RCA, LVEDP , balloon angioplasty to occluded mid LCx   . Carotid bruit     Bilateral  . Sinus bradycardia   . Dyslipidemia   . DVT (deep venous thrombosis) (HCC)     Remote portable as him   . COPD (chronic obstructive pulmonary disease) (HCC)   . Leg pain   . Productive cough   . Peripheral arterial disease (HCC)   . Carotid artery occlusion   . Anxiety   . Depression   . CHF (congestive heart failure) (HCC)   . Anginal pain (HCC)   . Type II diabetes mellitus (HCC)   . Umbilical  hernia   . GERD (gastroesophageal reflux disease)     "one dr says yes; one says no"  . Arthritis     "just about q where"  . Claustrophobia     Past Surgical History  Procedure Laterality Date  . Parotidectomy Left   . Cystectomy      "2 off my right posterior shoulder; 2 off my left neck"  . Appendectomy  05/1975  . Aortogram      with bilateral lower extremity run-off  . Colonoscopy  12/31/2010    ZOX:WRUE tortuous, but otherwise normal-appearing colon/ Normal rectum  . Esophagogastroduodenoscopy (egd) with propofol N/A 05/16/2014    AVW:[id number] inlet patch otherwise normal/s/p dilation as described above. Antral erosions and a single gastric ulcer s/p gastric bx. mild chronic gastritis. no h.pylori  . Esophageal dilation N/A 05/16/2014    Procedure: ESOPHAGEAL DILATION;  Surgeon: Corbin Ade, MD;  Location: [facility];  Service: Endoscopy;  Laterality: N/A;  #56 maloney  . Esophageal biopsy N/A 05/16/2014    Procedure: BIOPSY of stomach;  Surgeon: Corbin Ade, MD;  Location: [facility];  Service: Endoscopy;  Laterality: N/A;  . Abdominal angiogram N/A 11/12/2011    Procedure: ABDOMINAL ANGIOGRAM;  Surgeon: Sherren Kerns, MD;  Location: [facility] CATH LAB;  Service: Cardiovascular;  Laterality: N/A;  . Visceral angiogram  11/12/2011    Procedure: VISCERAL ANGIOGRAM;  Surgeon: Sherren Kerns, MD;  Location: [facility] CATH LAB;  Service: Cardiovascular;;  . Lower extremity angiogram Bilateral 11/12/2011    Procedure: LOWER EXTREMITY ANGIOGRAM;  Surgeon: Sherren Kerns, MD;  Location: [facility] CATH LAB;  Service: Cardiovascular;  Laterality: Bilateral;  . Left heart catheterization with coronary angiogram N/A 11/13/2013    Procedure: LEFT HEART CATHETERIZATION WITH CORONARY ANGIOGRAM;  Surgeon: Rollene Rotunda, MD;  Location: [facility] CATH LAB;  Service: Cardiovascular;  Laterality: N/A;  . Abdominal aortagram N/A 07/19/2014    Procedure: ABDOMINAL Ronny Flurry;  Surgeon: Sherren Kerns, MD;  Location: [facility]  CATH LAB;  Service: Cardiovascular;  Laterality: N/A;  . Cardiac catheterization  X 3  . Coronary angioplasty  01/22/2015  . Tonsillectomy and adenoidectomy    . Abdominal hysterectomy  05/1966    "partial w/left tube & ovary"  . Salpingoophorectomy Right 05/1966    "few days after hyster"  . Percutaneous coronary stent intervention (pci-s) N/A 01/22/2015    Procedure: PERCUTANEOUS CORONARY STENT INTERVENTION (PCI-S);  Surgeon: Corky Crafts, MD;  Location: [facility] CATH LAB;  Service: Cardiovascular;  Laterality: N/A;  . Cardiac catheterization  01/22/2015    Procedure: CORONARY/BYPASS GRAFT CTO INTERVENTION;  Surgeon: Corky Crafts, MD;  Location: [facility] CATH LAB;  Service: Cardiovascular;;    Social History Social History  Substance Use Topics  . Smoking status: Current Some Day Smoker -- 0.50 packs/day for 45 years    Types: Cigarettes    Start date: 01/06/1959  . Smokeless tobacco: Never Used  . Alcohol Use: No    Family History Family History  Problem Relation Age of Onset  . Cancer Maternal Grandmother     liver  . Cancer Maternal Grandfather     lung  . Diabetes Mother   . Diabetes Son     Allergies  Allergies  Allergen Reactions  . Penicillins Anaphylaxis  . Sulfonamide Derivatives Anaphylaxis  . Alupent [Metaproterenol Sulfate] Other (See Comments)    Makes pt "nervous"  . Azithromycin Other (See Comments)    unknown  . Linzess [Linaclotide] Nausea And Vomiting  . Omnipaque [Iohexol] Other (See Comments)    Pt states can take mix Dye and Metformin together.  Doesn't have SOB or rash  . Pantoprazole Nausea And Vomiting  . Paroxetine Other (See Comments)    unknown  . Varenicline Tartrate Other (See Comments)    unknown  . Benadryl [Diphenhydramine Hcl] Other (See Comments)    Can't breath  . Hydrocortisone Hives  . Prednisone Hives     Current Outpatient Prescriptions  Medication Sig Dispense Refill  . albuterol (2.5 MG/3ML) 0.083% NEBU 3 mL,  albuterol (5 MG/ML) 0.5% NEBU 0.5 mL Inhale 2.5 mg into the lungs every 6 (six) hours as needed (congestion).     Marland Kitchen albuterol (VENTOLIN HFA) 108 (90 BASE) MCG/ACT inhaler Inhale 2 puffs into the lungs every 6 (six) hours as needed for wheezing or shortness of breath.     . ALPRAZolam (XANAX) 1 MG tablet Take 0.5-1 mg by mouth 4 (four) times daily as needed for anxiety. For nerves    . amLODipine (NORVASC) 5 MG tablet Take 1 tablet (5 mg total) by mouth daily. 90 tablet 3  . aspirin 81 MG tablet Take 81 mg by mouth daily.      . clopidogrel (PLAVIX) 75 MG tablet Take 1 tablet (75 mg total) by mouth daily with breakfast. 90 tablet 3  . Dextromethorphan-Guaifenesin (DIABETIC TUSSIN DM MAX ST PO) Take by mouth as needed. Per phone call from patient after office visit 03-27-15    . furosemide (LASIX) 20 MG tablet Take 40 mg by mouth daily as needed for fluid. Per patient is taking 40 mg    .  metFORMIN (GLUCOPHAGE) 500 MG tablet Take 500 mg by mouth 2 (two) times daily.     . metoprolol succinate (TOPROL-XL) 25 MG 24 hr tablet Take 12.5 mg by mouth every morning.     . Multiple Vitamins-Minerals (MULTIVITAMIN PO) Take by mouth daily.    . nitroGLYCERIN (NITROSTAT) 0.4 MG SL tablet Place 1 tablet (0.4 mg total) under the tongue every 5 (five) minutes as needed. For chest pain. 25 tablet 3  . oxyCODONE-acetaminophen (ENDOCET) 10-325 MG per tablet Take 0.5-1 tablets by mouth every 4 (four) hours as needed for pain.     . potassium chloride (KLOR-CON) 10 MEQ CR tablet Take 10 mEq by mouth as needed. When taking Furosemide    . Tetrahydroz-Dextran-PEG-Povid (EYE DROPS ADVANCED RELIEF OP) Apply 1 drop to eye daily as needed (dry eyes).     No current facility-administered medications for this visit.    ROS:   General:  No weight loss, Fever, chills  HEENT: No recent headaches, no nasal bleeding, no visual changes, no sore throat  Neurologic: No dizziness, blackouts, seizures. No recent symptoms of stroke  or mini- stroke. No recent episodes of slurred speech, or temporary blindness.  Cardiac: [x]  recent episodes of chest pain/pressure, no shortness of breath at rest.  No shortness of breath with exertion.  Denies history of atrial fibrillation or irregular heartbeat  Vascular: No history of rest pain in feet.  No history of claudication.  No history of non-healing ulcer, No history of DVT   Pulmonary: No home oxygen, no productive cough, no hemoptysis,  No asthma, positive [x]  wheezing  Musculoskeletal:  [ ]  Arthritis, [ ]  Low back pain,  [ ]  Joint pain  Hematologic:No history of hypercoagulable state.  No history of easy bleeding.  No history of anemia  Gastrointestinal: No hematochezia or melena,  No gastroesophageal reflux, no trouble swallowing  Urinary: [ ]  chronic Kidney disease, [ ]  on HD - [ ]  MWF or [ ]  TTHS, [ ]  Burning with urination, [ ]  Frequent urination, [ ]  Difficulty urinating;   Skin: No rashes  Psychological: No history of anxiety,  No history of depression   Physical Examination  Filed Vitals:   11/13/15 1520  BP: 116/64  Pulse: 62  Temp: 97.3 F (36.3 C)  Resp: 14  Height: 5\' 3"  (1.6 m)  Weight: 104 lb (47.174 kg)  SpO2: 100%    Body mass index is 18.43 kg/(m^2).  General:  Alert and oriented, no acute distress HEENT: Normal Neck: positive bilateral bruit or JVD Pulmonary: Wheezing auscultation bilaterally Cardiac: Regular Rate and Rhythm with murmur Abdomen: Soft, non-tender, non-distended, no mass, no scars, palpable aortic pulse  Skin: No rash Extremity Pulses:  2+ radial, brachial, femoral  pulses bilaterally Musculoskeletal: No deformity, positive min edema on the left LE compared to the right edema  Neurologic: Upper and lower extremity motor 5/5 and symmetric  DATA:   Carotid duplex Right 40-59% stenosis Left 60-79% stenosis No change from her previous duplex  ABI's Right 0.45 monophasic flow Left 0.35 monophasic flow   ASSESSMENT:    Chronic PAD bilateral LE   She had an arteriogram in August of 2015. This showed bilateral high grade common femoral stenoses as well as bilateral superficial femoral artery occlusions. She was offered femoral popliteal bypass at that time but refused. She has previously had a left leg vein stripping.   Carotid stenosis bilaterally asymptomatic with bilateral bruits.   PLAN:   She will follow up in 3  months for repeat ABI's and Carotid duplex scans.  We suggested she stop smoking and she should call her cardiologist tomorrow to discuss her exertional CP.  She stated she would.  Thomasena EdisOLLINS, EMMA MAUREEN PA-C [facility] [place]  The patient was seen today in conjunction with Dr. Darrick PennaFields   History and exam findings as above. Patient really is asymptomatic despite having decreased ABIs in both lower extremities.  She has a moderate recurrent stenosis on the left carotid and a mild stenosis of the right carotid.  Continued conservative management of her lower extremity arterial occlusive disease and carotid occlusive disease at this point. Patient will follow-up in 6 months time.    Strongly emphasized again to the patient today to quit smoking.  Fabienne Brunsharles Dajha Urquilla, MD [facility] [place] Office: [phone] Pager: [phone]

## 2015-11-13 NOTE — Addendum Note (Signed)
Addended by: Adria DillELDRIDGE-LEWIS, Addison Freimuth L on: 11/13/2015 04:11 PM   Modules accepted: Orders

## 2015-11-20 ENCOUNTER — Other Ambulatory Visit: Payer: Self-pay | Admitting: Cardiovascular Disease

## 2015-11-20 MED ORDER — CLOPIDOGREL BISULFATE 75 MG PO TABS: 75.0000 mg | ORAL_TABLET | Freq: Every day | ORAL | Status: AC

## 2015-11-20 NOTE — Telephone Encounter (Signed)
Medication sent to pharmacy  

## 2015-11-20 NOTE — Telephone Encounter (Signed)
clopidogrel (PLAVIX) 75 MG tablet - needing prescription refilled 90 days supply  [facility]

## 2015-12-03 ENCOUNTER — Telehealth: Payer: Self-pay | Admitting: Internal Medicine

## 2015-12-03 NOTE — Telephone Encounter (Signed)
Pt called with multiple complaints of being sick, nauseated and constipated and [facility] [place]. Patient is living out of a motel [place] [facility] and can be reached at [phone] room 169

## 2015-12-03 NOTE — Telephone Encounter (Signed)
[facility], [place]t [employer]. Pt has been feeling bad since before Christmas. She said everytime she eats it doesn't feel like she cannot have a bm. She is taking 4 dulcolax every other day. She said she tried taking one a day but she said it didn't work at all and caused her to have a lot of gas. She is very constpated and feels like she needs something else. She has tried dulcolax, sennekot, stool softeners. Pt has tried miralax in the past.  She feels full and needs to take her DM medications. Pt is not getting much exercise d/t living in a motel. Pt has not tried amitiza or linzess.    Pt wants to know if there is anything she can try to have a bm?

## 2015-12-04 NOTE — Telephone Encounter (Signed)
Tried to call- NA 

## 2015-12-04 NOTE — Telephone Encounter (Signed)
Not allergic to MiraLAX per my review of the record. Woukd try 1-2 doses of MiraLAX daily as needed. Did not do well with Linzess. We'll hold off on Amitiza

## 2015-12-05 ENCOUNTER — Inpatient Hospital Stay
Admission: AD | Admit: 2015-12-05 | Payer: Self-pay | Source: Other Acute Inpatient Hospital | Admitting: Internal Medicine

## 2015-12-05 ENCOUNTER — Encounter (HOSPITAL_COMMUNITY): Admission: EM | Disposition: E | Payer: Self-pay | Source: Home / Self Care | Attending: Interventional Cardiology

## 2015-12-05 ENCOUNTER — Inpatient Hospital Stay (HOSPITAL_COMMUNITY): Payer: Medicare Other

## 2015-12-05 ENCOUNTER — Encounter: Payer: Self-pay | Admitting: Physician Assistant

## 2015-12-05 ENCOUNTER — Emergency Department (HOSPITAL_COMMUNITY): Payer: Medicare Other

## 2015-12-05 DIAGNOSIS — R7401 Elevation of levels of liver transaminase levels: Secondary | ICD-10-CM

## 2015-12-05 DIAGNOSIS — Z7902 Long term (current) use of antithrombotics/antiplatelets: Secondary | ICD-10-CM | POA: Diagnosis not present

## 2015-12-05 DIAGNOSIS — I255 Ischemic cardiomyopathy: Secondary | ICD-10-CM | POA: Diagnosis present

## 2015-12-05 DIAGNOSIS — E11649 Type 2 diabetes mellitus with hypoglycemia without coma: Secondary | ICD-10-CM | POA: Diagnosis present

## 2015-12-05 DIAGNOSIS — I2582 Chronic total occlusion of coronary artery: Secondary | ICD-10-CM | POA: Diagnosis present

## 2015-12-05 DIAGNOSIS — I4510 Unspecified right bundle-branch block: Secondary | ICD-10-CM | POA: Diagnosis present

## 2015-12-05 DIAGNOSIS — I739 Peripheral vascular disease, unspecified: Secondary | ICD-10-CM

## 2015-12-05 DIAGNOSIS — J209 Acute bronchitis, unspecified: Secondary | ICD-10-CM | POA: Diagnosis present

## 2015-12-05 DIAGNOSIS — E1159 Type 2 diabetes mellitus with other circulatory complications: Secondary | ICD-10-CM

## 2015-12-05 DIAGNOSIS — T8579XA Infection and inflammatory reaction due to other internal prosthetic devices, implants and grafts, initial encounter: Secondary | ICD-10-CM

## 2015-12-05 DIAGNOSIS — J449 Chronic obstructive pulmonary disease, unspecified: Secondary | ICD-10-CM | POA: Diagnosis present

## 2015-12-05 DIAGNOSIS — F419 Anxiety disorder, unspecified: Secondary | ICD-10-CM | POA: Diagnosis present

## 2015-12-05 DIAGNOSIS — I5021 Acute systolic (congestive) heart failure: Secondary | ICD-10-CM | POA: Insufficient documentation

## 2015-12-05 DIAGNOSIS — Z7984 Long term (current) use of oral hypoglycemic drugs: Secondary | ICD-10-CM | POA: Diagnosis not present

## 2015-12-05 DIAGNOSIS — E871 Hypo-osmolality and hyponatremia: Secondary | ICD-10-CM | POA: Diagnosis present

## 2015-12-05 DIAGNOSIS — E1151 Type 2 diabetes mellitus with diabetic peripheral angiopathy without gangrene: Secondary | ICD-10-CM | POA: Diagnosis present

## 2015-12-05 DIAGNOSIS — E1165 Type 2 diabetes mellitus with hyperglycemia: Secondary | ICD-10-CM | POA: Diagnosis present

## 2015-12-05 DIAGNOSIS — R740 Nonspecific elevation of levels of transaminase and lactic acid dehydrogenase [LDH]: Secondary | ICD-10-CM | POA: Diagnosis present

## 2015-12-05 DIAGNOSIS — Z9861 Coronary angioplasty status: Secondary | ICD-10-CM

## 2015-12-05 DIAGNOSIS — I4901 Ventricular fibrillation: Secondary | ICD-10-CM | POA: Diagnosis not present

## 2015-12-05 DIAGNOSIS — I214 Non-ST elevation (NSTEMI) myocardial infarction: Secondary | ICD-10-CM | POA: Diagnosis present

## 2015-12-05 DIAGNOSIS — F17210 Nicotine dependence, cigarettes, uncomplicated: Secondary | ICD-10-CM | POA: Diagnosis present

## 2015-12-05 DIAGNOSIS — Z23 Encounter for immunization: Secondary | ICD-10-CM

## 2015-12-05 DIAGNOSIS — Z79899 Other long term (current) drug therapy: Secondary | ICD-10-CM

## 2015-12-05 DIAGNOSIS — I509 Heart failure, unspecified: Secondary | ICD-10-CM

## 2015-12-05 DIAGNOSIS — Z86718 Personal history of other venous thrombosis and embolism: Secondary | ICD-10-CM

## 2015-12-05 DIAGNOSIS — I2510 Atherosclerotic heart disease of native coronary artery without angina pectoris: Secondary | ICD-10-CM | POA: Diagnosis present

## 2015-12-05 DIAGNOSIS — I469 Cardiac arrest, cause unspecified: Secondary | ICD-10-CM | POA: Diagnosis not present

## 2015-12-05 DIAGNOSIS — R0689 Other abnormalities of breathing: Secondary | ICD-10-CM | POA: Diagnosis not present

## 2015-12-05 DIAGNOSIS — Z955 Presence of coronary angioplasty implant and graft: Secondary | ICD-10-CM | POA: Diagnosis not present

## 2015-12-05 DIAGNOSIS — Z7982 Long term (current) use of aspirin: Secondary | ICD-10-CM | POA: Diagnosis not present

## 2015-12-05 DIAGNOSIS — E785 Hyperlipidemia, unspecified: Secondary | ICD-10-CM | POA: Diagnosis present

## 2015-12-05 DIAGNOSIS — R0602 Shortness of breath: Secondary | ICD-10-CM

## 2015-12-05 DIAGNOSIS — E875 Hyperkalemia: Secondary | ICD-10-CM | POA: Diagnosis present

## 2015-12-05 LAB — COMPREHENSIVE METABOLIC PANEL
ALT: 118 U/L — AB (ref 14–54)
ALT: 119 U/L — AB (ref 14–54)
AST: 79 U/L — ABNORMAL HIGH (ref 15–41)
AST: 77 U/L — AB (ref 15–41)
Albumin: 3.4 g/dL — ABNORMAL LOW (ref 3.5–5.0)
Albumin: 3.3 g/dL — ABNORMAL LOW (ref 3.5–5.0)
Alkaline Phosphatase: 141 U/L — ABNORMAL HIGH (ref 38–126)
Alkaline Phosphatase: 156 U/L — ABNORMAL HIGH (ref 38–126)
Anion gap: 11 (ref 5–15)
Anion gap: 8 (ref 5–15)
BILIRUBIN TOTAL: 0.7 mg/dL (ref 0.3–1.2)
BUN: 17 mg/dL (ref 6–20)
BUN: 17 mg/dL (ref 6–20)
CHLORIDE: 95 mmol/L — AB (ref 101–111)
CO2: 25 mmol/L (ref 22–32)
CO2: 26 mmol/L (ref 22–32)
CREATININE: 0.78 mg/dL (ref 0.44–1.00)
CREATININE: 0.67 mg/dL (ref 0.44–1.00)
Calcium: 8.8 mg/dL — ABNORMAL LOW (ref 8.9–10.3)
Calcium: 9 mg/dL (ref 8.9–10.3)
Chloride: 97 mmol/L — ABNORMAL LOW (ref 101–111)
GFR calc Af Amer: >60 mL/min (ref >60)
Glucose, Bld: 82 mg/dL (ref 65–99)
Glucose, Bld: 73 mg/dL (ref 65–99)
POTASSIUM: 4.3 mmol/L (ref 3.5–5.1)
Potassium: 4.4 mmol/L (ref 3.5–5.1)
SODIUM: 131 mmol/L — AB (ref 135–145)
Sodium: 131 mmol/L — ABNORMAL LOW (ref 135–145)
TOTAL PROTEIN: 6 g/dL — AB (ref 6.5–8.1)
Total Bilirubin: 0.7 mg/dL (ref 0.3–1.2)
Total Protein: 6 g/dL — ABNORMAL LOW (ref 6.5–8.1)

## 2015-12-05 LAB — CBC WITH DIFFERENTIAL/PLATELET
BASOS ABS: 0 10*3/uL (ref 0.0–0.1)
Basophils Absolute: 0 10*3/uL (ref 0.0–0.1)
Basophils Relative: 0 %
Basophils Relative: 0 %
EOS ABS: 0.1 10*3/uL (ref 0.0–0.7)
EOS PCT: 1 %
Eosinophils Absolute: 0 10*3/uL (ref 0.0–0.7)
Eosinophils Relative: 0 %
HCT: 33.4 % — ABNORMAL LOW (ref 36.0–46.0)
HEMATOCRIT: 32.4 % — AB (ref 36.0–46.0)
HEMOGLOBIN: 11.1 g/dL — AB (ref 12.0–15.0)
Hemoglobin: 11.4 g/dL — ABNORMAL LOW (ref 12.0–15.0)
LYMPHS ABS: 0.9 10*3/uL (ref 0.7–4.0)
LYMPHS ABS: 1 10*3/uL (ref 0.7–4.0)
LYMPHS PCT: 11 %
Lymphocytes Relative: 11 %
MCH: 28.3 pg (ref 26.0–34.0)
MCH: 28.4 pg (ref 26.0–34.0)
MCHC: 34.3 g/dL (ref 30.0–36.0)
MCHC: 34.1 g/dL (ref 30.0–36.0)
MCV: 82.7 fL (ref 78.0–100.0)
MCV: 83.3 fL (ref 78.0–100.0)
MONOS PCT: 11 %
Monocytes Absolute: 0.9 10*3/uL (ref 0.1–1.0)
Monocytes Absolute: 0.8 10*3/uL (ref 0.1–1.0)
Monocytes Relative: 8 %
NEUTROS ABS: 6.2 10*3/uL (ref 1.7–7.7)
NEUTROS PCT: 78 %
Neutro Abs: 7.4 10*3/uL (ref 1.7–7.7)
Neutrophils Relative %: 80 %
PLATELETS: 234 10*3/uL (ref 150–400)
Platelets: 231 10*3/uL (ref 150–400)
RBC: 3.92 MIL/uL (ref 3.87–5.11)
RBC: 4.01 MIL/uL (ref 3.87–5.11)
RDW: 13.4 % (ref 11.5–15.5)
RDW: 13.4 % (ref 11.5–15.5)
WBC: 8 10*3/uL (ref 4.0–10.5)
WBC: 9.2 10*3/uL (ref 4.0–10.5)

## 2015-12-05 LAB — I-STAT TROPONIN, ED: Troponin i, poc: 6.64 ng/mL (ref 0.00–0.08)

## 2015-12-05 LAB — GLUCOSE, CAPILLARY: Glucose-Capillary: 174 mg/dL — ABNORMAL HIGH (ref 65–99)

## 2015-12-05 LAB — TSH: TSH: 1.515 u[IU]/mL (ref 0.350–4.500)

## 2015-12-05 LAB — MRSA PCR SCREENING: MRSA BY PCR: NEGATIVE

## 2015-12-05 LAB — I-STAT CG4 LACTIC ACID, ED: LACTIC ACID, VENOUS: 3.27 mmol/L — AB (ref 0.5–2.0)

## 2015-12-05 LAB — TROPONIN I: Troponin I: 9.04 ng/mL (ref <0.031)

## 2015-12-05 LAB — BRAIN NATRIURETIC PEPTIDE: B NATRIURETIC PEPTIDE 5: 2604 pg/mL — AB (ref 0.0–100.0)

## 2015-12-05 LAB — MAGNESIUM: MAGNESIUM: 2.2 mg/dL (ref 1.7–2.4)

## 2015-12-05 SURGERY — Surgical Case
Anesthesia: *Unknown

## 2015-12-05 SURGERY — LEFT HEART CATH AND CORONARY ANGIOGRAPHY
Anesthesia: LOCAL

## 2015-12-05 MED ORDER — CLOPIDOGREL BISULFATE 300 MG PO TABS
300.0000 mg | ORAL_TABLET | Freq: Once | ORAL | Status: AC
  Administered 2015-12-05: 300 mg via ORAL
  Filled 2015-12-05: qty 1

## 2015-12-05 MED ORDER — CLOPIDOGREL BISULFATE 75 MG PO TABS
75.0000 mg | ORAL_TABLET | Freq: Every day | ORAL | Status: DC
  Administered 2015-12-06: 75 mg via ORAL
  Filled 2015-12-05: qty 1

## 2015-12-05 MED ORDER — INSULIN ASPART 100 UNIT/ML ~~LOC~~ SOLN
0.0000 [IU] | Freq: Three times a day (TID) | SUBCUTANEOUS | Status: DC
  Administered 2015-12-06: 1 [IU] via SUBCUTANEOUS
  Administered 2015-12-06: 5 [IU] via SUBCUTANEOUS

## 2015-12-05 MED ORDER — ACETAMINOPHEN 325 MG PO TABS: 650.0000 mg | ORAL_TABLET | ORAL | Status: DC | PRN

## 2015-12-05 MED ORDER — METOPROLOL SUCCINATE ER 25 MG PO TB24
12.5000 mg | ORAL_TABLET | Freq: Every day | ORAL | Status: DC
  Administered 2015-12-05 – 2015-12-06 (×2): 12.5 mg via ORAL
  Filled 2015-12-05 (×2): qty 1

## 2015-12-05 MED ORDER — SODIUM CHLORIDE 0.9 % IV BOLUS (SEPSIS)
250.0000 mL | Freq: Once | INTRAVENOUS | Status: AC
  Administered 2015-12-05: 250 mL via INTRAVENOUS

## 2015-12-05 MED ORDER — HEPARIN BOLUS VIA INFUSION
3000.0000 [IU] | Freq: Once | INTRAVENOUS | Status: DC
  Filled 2015-12-05: qty 3000

## 2015-12-05 MED ORDER — ALPRAZOLAM 0.5 MG PO TABS: 1.0000 mg | ORAL_TABLET | Freq: Every day | ORAL | Status: DC

## 2015-12-05 MED ORDER — SODIUM CHLORIDE 0.9 % IJ SOLN: 3.0000 mL | INTRAMUSCULAR | Status: DC | PRN

## 2015-12-05 MED ORDER — ALPRAZOLAM 0.5 MG PO TABS
0.5000 mg | ORAL_TABLET | Freq: Four times a day (QID) | ORAL | Status: DC
  Administered 2015-12-06 (×3): 0.5 mg via ORAL
  Filled 2015-12-05 (×3): qty 1

## 2015-12-05 MED ORDER — OXYCODONE-ACETAMINOPHEN 5-325 MG PO TABS
1.0000 | ORAL_TABLET | Freq: Every day | ORAL | Status: DC
  Administered 2015-12-05 – 2015-12-06 (×4): 1 via ORAL
  Filled 2015-12-05 (×4): qty 1

## 2015-12-05 MED ORDER — ASPIRIN EC 81 MG PO TBEC
81.0000 mg | DELAYED_RELEASE_TABLET | Freq: Every day | ORAL | Status: DC
  Administered 2015-12-06: 81 mg via ORAL
  Filled 2015-12-05: qty 1

## 2015-12-05 MED ORDER — ALPRAZOLAM 0.5 MG PO TABS: 0.5000 mg | ORAL_TABLET | Freq: Four times a day (QID) | ORAL | Status: DC

## 2015-12-05 MED ORDER — FUROSEMIDE 10 MG/ML IJ SOLN
40.0000 mg | Freq: Once | INTRAMUSCULAR | Status: AC
  Administered 2015-12-05: 40 mg via INTRAVENOUS
  Filled 2015-12-05: qty 4

## 2015-12-05 MED ORDER — SODIUM CHLORIDE 0.9 % IJ SOLN
3.0000 mL | Freq: Two times a day (BID) | INTRAMUSCULAR | Status: DC
  Administered 2015-12-05 – 2015-12-06 (×2): 3 mL via INTRAVENOUS

## 2015-12-05 MED ORDER — ADULT MULTIVITAMIN W/MINERALS CH
1.0000 | ORAL_TABLET | Freq: Every day | ORAL | Status: DC
  Administered 2015-12-06: 1 via ORAL
  Filled 2015-12-05: qty 1

## 2015-12-05 MED ORDER — ALPRAZOLAM 0.5 MG PO TABS
1.0000 mg | ORAL_TABLET | Freq: Every day | ORAL | Status: DC
  Administered 2015-12-05: 1 mg via ORAL
  Filled 2015-12-05: qty 2

## 2015-12-05 MED ORDER — PNEUMOCOCCAL VAC POLYVALENT 25 MCG/0.5ML IJ INJ
0.5000 mL | INJECTION | INTRAMUSCULAR | Status: AC
  Administered 2015-12-06: 0.5 mL via INTRAMUSCULAR
  Filled 2015-12-05: qty 0.5

## 2015-12-05 MED ORDER — SODIUM CHLORIDE 0.9 % IV SOLN: 250.0000 mL | INTRAVENOUS | Status: DC | PRN

## 2015-12-05 MED ORDER — ASPIRIN 81 MG PO CHEW: 324.0000 mg | CHEWABLE_TABLET | Freq: Once | ORAL | Status: DC

## 2015-12-05 MED ORDER — HEPARIN (PORCINE) IN NACL 100-0.45 UNIT/ML-% IJ SOLN
750.0000 [IU]/h | INTRAMUSCULAR | Status: DC
  Administered 2015-12-05: 550 [IU]/h via INTRAVENOUS
  Filled 2015-12-05: qty 250

## 2015-12-05 MED ORDER — ALPRAZOLAM 0.25 MG PO TABS: 0.5000 mg | ORAL_TABLET | ORAL | Status: DC

## 2015-12-05 MED ORDER — ONDANSETRON HCL 4 MG/2ML IJ SOLN: 4.0000 mg | Freq: Four times a day (QID) | INTRAMUSCULAR | Status: DC | PRN

## 2015-12-05 MED ORDER — CLOPIDOGREL BISULFATE 75 MG PO TABS: 75.0000 mg | ORAL_TABLET | Freq: Every day | ORAL | Status: DC

## 2015-12-05 MED ORDER — ALBUTEROL SULFATE HFA 108 (90 BASE) MCG/ACT IN AERS: 2.0000 | INHALATION_SPRAY | Freq: Four times a day (QID) | RESPIRATORY_TRACT | Status: DC | PRN

## 2015-12-05 MED ORDER — ALBUTEROL SULFATE (2.5 MG/3ML) 0.083% IN NEBU
3.0000 mL | INHALATION_SOLUTION | Freq: Four times a day (QID) | RESPIRATORY_TRACT | Status: DC | PRN
  Administered 2015-12-05 – 2015-12-06 (×3): 3 mL via RESPIRATORY_TRACT
  Filled 2015-12-05 (×3): qty 3

## 2015-12-05 NOTE — ED Provider Notes (Signed)
CSN: [id number]     Arrival date & time 12-07-15  1616 History   First MD Initiated Contact with Patient 2015-12-07 1629     Chief Complaint  Patient presents with  . Shortness of Breath   70 year old Caucasian female with PMH of CAD, HLD, COPD, DM 2, and anxiety who presents as transfer from [facility] for possible STEMI. Patient endorses she's had 1 week of worsening shortness of breath and indigestion. She's also felt weak. At outside hospital she was found to have a BNP of 13,000 and a troponin of 2. Sent here for possible CAD/ACS. Endorses lower extremity swelling for the past couple of days. She denies any fevers, cough, chills, chest pain, back pain, flank pain, dysuria, hematuria, sick contacts, recent travel.  (Consider location/radiation/quality/duration/timing/severity/associated sxs/prior Treatment) Patient is a 70 y.o. female presenting with shortness of breath. The history is provided by the patient.  Shortness of Breath Severity:  Severe Onset quality:  Sudden Duration:  1 week Timing:  Constant Progression:  Improving Chronicity:  New Relieved by:  Nothing Worsened by:  Exertion and movement Associated symptoms: no abdominal pain, no chest pain, no cough, no diaphoresis, no fever, no headaches, no sore throat, no syncope, no vomiting and no wheezing     Past Medical History  Diagnosis Date  . Coronary artery disease     a. Severe single-vessel/100% occluded right with distal collateralization.Medical therapy/Normal LV fx b. cath 01/22/2015 chronically occluded RCA, LVEDP , balloon angioplasty to occluded mid LCx   . Carotid bruit     Bilateral  . Sinus bradycardia   . Dyslipidemia   . DVT (deep venous thrombosis) (HCC)     Remote portable as him   . COPD (chronic obstructive pulmonary disease) (HCC)   . Leg pain   . Productive cough   . Peripheral arterial disease (HCC)     a. LE PVD - was offered right common femoral endarterectomy and right femoral to below  knee pop bypass but refused. b. Also followed for celiac stenosis, carotid stenosis by [facility].  . Carotid artery occlusion   . Anxiety   . Depression   . Type II diabetes mellitus (HCC)   . Umbilical hernia   . GERD (gastroesophageal reflux disease)     "one dr says yes; one says no"  . Arthritis     "just about q where"  . Claustrophobia    Past Surgical History  Procedure Laterality Date  . Parotidectomy Left   . Cystectomy      "2 off my right posterior shoulder; 2 off my left neck"  . Appendectomy  05/1975  . Aortogram      with bilateral lower extremity run-off  . Colonoscopy  12/31/2010    WUJ:WJXB tortuous, but otherwise normal-appearing colon/ Normal rectum  . Esophagogastroduodenoscopy (egd) with propofol N/A 05/16/2014    JYN:WGNFAOZHYQ inlet patch otherwise normal/s/p dilation as described above. Antral erosions and a single gastric ulcer s/p gastric bx. mild chronic gastritis. no h.pylori  . Esophageal dilation N/A 05/16/2014    Procedure: ESOPHAGEAL DILATION;  Surgeon: Corbin Ade, MD;  Location: [facility];  Service: Endoscopy;  Laterality: N/A;  #56 maloney  . Esophageal biopsy N/A 05/16/2014    Procedure: BIOPSY of stomach;  Surgeon: Corbin Ade, MD;  Location: [facility];  Service: Endoscopy;  Laterality: N/A;  . Abdominal angiogram N/A 11/12/2011    Procedure: ABDOMINAL ANGIOGRAM;  Surgeon: Sherren Kerns, MD;  Location: [facility] CATH LAB;  Service: Cardiovascular;  Laterality: N/A;  . Visceral angiogram  11/12/2011    Procedure: VISCERAL ANGIOGRAM;  Surgeon: Sherren Kerns, MD;  Location: [facility] CATH LAB;  Service: Cardiovascular;;  . Lower extremity angiogram Bilateral 11/12/2011    Procedure: LOWER EXTREMITY ANGIOGRAM;  Surgeon: Sherren Kerns, MD;  Location: [facility] CATH LAB;  Service: Cardiovascular;  Laterality: Bilateral;  . Left heart catheterization with coronary angiogram N/A 11/13/2013    Procedure: LEFT HEART CATHETERIZATION WITH CORONARY ANGIOGRAM;  Surgeon: Rollene Rotunda, MD;  Location: [facility] CATH LAB;  Service: Cardiovascular;  Laterality: N/A;  . Abdominal aortagram N/A 07/19/2014    Procedure: ABDOMINAL Ronny Flurry;  Surgeon: Sherren Kerns, MD;  Location: [facility] CATH LAB;  Service: Cardiovascular;  Laterality: N/A;  . Cardiac catheterization  X 3  . Coronary angioplasty  01/22/2015  . Tonsillectomy and adenoidectomy    . Abdominal hysterectomy  05/1966    "partial w/left tube & ovary"  . Salpingoophorectomy Right 05/1966    "few days after hyster"  . Percutaneous coronary stent intervention (pci-s) N/A 01/22/2015    Procedure: PERCUTANEOUS CORONARY STENT INTERVENTION (PCI-S);  Surgeon: Corky Crafts, MD;  Location: [facility] CATH LAB;  Service: Cardiovascular;  Laterality: N/A;  . Cardiac catheterization  01/22/2015    Procedure: CORONARY/BYPASS GRAFT CTO INTERVENTION;  Surgeon: Corky Crafts, MD;  Location: [facility] CATH LAB;  Service: Cardiovascular;;   Family History  Problem Relation Age of Onset  . Cancer Maternal Grandmother     liver  . Cancer Maternal Grandfather     lung  . Diabetes Mother   . Diabetes Son    Social History  Substance Use Topics  . Smoking status: Current Some Day Smoker -- 0.50 packs/day for 45 years    Types: Cigarettes    Start date: 01/06/1959  . Smokeless tobacco: Never Used  . Alcohol Use: No   OB History    No data available     Review of Systems  Constitutional: Negative for fever, chills and diaphoresis.  HENT: Negative for sore throat.   Respiratory: Positive for shortness of breath. Negative for cough and wheezing.   Cardiovascular: Negative for chest pain, palpitations, leg swelling and syncope.  Gastrointestinal: Positive for abdominal distention (was constipated and took dulcolax and mag citrate before having BM yesterday). Negative for nausea, vomiting, abdominal pain, diarrhea and constipation.  Genitourinary: Negative for dysuria, frequency, flank pain and decreased urine volume.  Musculoskeletal:        Leg swelling  Skin: Negative for wound.  Neurological: Negative for dizziness, speech difficulty, light-headedness and headaches.  All other systems reviewed and are negative.     Allergies  Benadryl; Hydrocortisone; Penicillins; Prednisone; Sulfonamide derivatives; Alupent; Azithromycin; Doxycycline; Linzess; Omnipaque; Pantoprazole; Paroxetine; and Varenicline tartrate  Home Medications   Prior to Admission medications   Medication Sig Start Date End Date Taking? Authorizing Provider  albuterol (2.5 MG/3ML) 0.083% NEBU 3 mL, albuterol (5 MG/ML) 0.5% NEBU 0.5 mL Inhale 2.5 mg into the lungs every 4 (four) hours as needed (shortness of breath/ wheezing).    Yes Historical Provider, MD  albuterol (VENTOLIN HFA) 108 (90 BASE) MCG/ACT inhaler Inhale 2 puffs into the lungs every 6 (six) hours as needed for wheezing or shortness of breath.    Yes Historical Provider, MD  ALPRAZolam Prudy Feeler) 1 MG tablet Take 0.5-1 mg by mouth every 4 (four) hours. For nerves; take 1/2 tablet (0.5 mg) by mouth every 4 hours during the day and take 1 tablet (1 mg) at bedtime  Yes Historical Provider, MD  amLODipine (NORVASC) 5 MG tablet Take 1 tablet (5 mg total) by mouth daily. 11/03/15  Yes Antoine Poche, MD  aspirin EC 81 MG tablet Take 81 mg by mouth daily.   Yes Historical Provider, MD  clopidogrel (PLAVIX) 75 MG tablet Take 1 tablet (75 mg total) by mouth daily with breakfast. 11/20/15  Yes Antoine Poche, MD  Dextromethorphan-Guaifenesin (DIABETIC TUSSIN DM MAX ST PO) Take 2.5 mLs by mouth every 4 (four) hours as needed (cough/ wheezing). Per phone call from patient after office visit 03-27-15   Yes Historical Provider, MD  furosemide (LASIX) 20 MG tablet Take 40 mg by mouth daily as needed for fluid or edema (ankle swelling).    Yes Historical Provider, MD  metFORMIN (GLUCOPHAGE) 500 MG tablet Take 1,000 mg by mouth 2 (two) times daily with a meal.    Yes Historical Provider, MD  metoprolol  succinate (TOPROL-XL) 25 MG 24 hr tablet Take 12.5 mg by mouth daily.    Yes Historical Provider, MD  Multiple Vitamin (MULTIVITAMIN WITH MINERALS) TABS tablet Take 1 tablet by mouth daily. Centrum Silver Women's 50 plus   Yes Historical Provider, MD  nitroGLYCERIN (NITROSTAT) 0.4 MG SL tablet Place 1 tablet (0.4 mg total) under the tongue every 5 (five) minutes as needed. For chest pain. Patient taking differently: Place 0.4 mg under the tongue every 5 (five) minutes as needed for chest pain.  04/06/12  Yes Rande Brunt, PA-C  oxyCODONE-acetaminophen (ENDOCET) 10-325 MG per tablet Take 0.5-1 tablets by mouth every 4 (four) hours. Scheduled: take 1/2 tablet by mouth every 4 hours during the day and take 1 tablet at bedtime   Yes Historical Provider, MD  potassium chloride (KLOR-CON) 10 MEQ CR tablet Take 10 mEq by mouth daily as needed (take with furosemide dose).    Yes Historical Provider, MD  Tetrahydroz-Dextran-PEG-Povid (EYE DROPS ADVANCED RELIEF OP) Place 1 drop into both eyes daily as needed (dry eyes).    Yes Historical Provider, MD   BP 108/62 mmHg  Pulse 85  Temp(Src) 97.8 F (36.6 C) (Oral)  Resp 23  SpO2 93% Physical Exam  Constitutional: She is oriented to person, place, and time. She appears well-developed and well-nourished. No distress.  HENT:  Head: Normocephalic and atraumatic.  Eyes: Pupils are equal, round, and reactive to light.  Neck: Normal range of motion.  Cardiovascular: Normal rate, regular rhythm, normal heart sounds and intact distal pulses.  Exam reveals no gallop and no friction rub.   No murmur heard. Pulmonary/Chest: Effort normal and breath sounds normal. No respiratory distress. She has no wheezes. She has no rales. She exhibits no tenderness.  Abdominal: Soft. Bowel sounds are normal. She exhibits no distension and no mass. There is no tenderness. There is no rebound and no guarding.  Musculoskeletal: She exhibits edema (1+ pitting in bilateral LEs). She  exhibits no tenderness.  Lymphadenopathy:    She has no cervical adenopathy.  Neurological: She is alert and oriented to person, place, and time. No cranial nerve deficit. Coordination normal.  Skin: Skin is warm and dry. She is not diaphoretic.  Nursing note and vitals reviewed.   ED Course  Procedures (including critical care time) Labs Review Labs Reviewed  COMPREHENSIVE METABOLIC PANEL - Abnormal; Notable for the following:    Sodium 131 (*)    Chloride 95 (*)    Calcium 8.8 (*)    Total Protein 6.0 (*)    Albumin 3.4 (*)  AST 79 (*)    ALT 118 (*)    Alkaline Phosphatase 141 (*)    All other components within normal limits  CBC WITH DIFFERENTIAL/PLATELET - Abnormal; Notable for the following:    Hemoglobin 11.1 (*)    HCT 32.4 (*)    All other components within normal limits  BRAIN NATRIURETIC PEPTIDE - Abnormal; Notable for the following:    B Natriuretic Peptide 2604.0 (*)    All other components within normal limits  I-STAT CG4 LACTIC ACID, ED - Abnormal; Notable for the following:    Lactic Acid, Venous 3.27 (*)    All other components within normal limits  I-STAT TROPOININ, ED - Abnormal; Notable for the following:    Troponin i, poc 6.64 (*)    All other components within normal limits  HEPARIN LEVEL (UNFRACTIONATED)  CBC  URINALYSIS, ROUTINE W REFLEX MICROSCOPIC (NOT AT [facility])  HEPATIC FUNCTION PANEL  BASIC METABOLIC PANEL  COMPREHENSIVE METABOLIC PANEL  CBC WITH DIFFERENTIAL/PLATELET  MAGNESIUM  TSH  TROPONIN I  TROPONIN I  TROPONIN I    Imaging Review No results found. I have personally reviewed and evaluated these images and lab results as part of my medical decision-making.   EKG Interpretation   Date/Time:  Friday December 05 2015 16:34:08 EST Ventricular Rate:  77 PR Interval:  144 QRS Duration: 149 QT Interval:  418 QTC Calculation: 473 R Axis:   118 Text Interpretation:  Sinus rhythm Right bundle branch block No  significant change  since last tracing Confirmed by FLOYD MD, Reuel BoomANIEL  [id number]) on 12/01/2015 5:34:59 PM      MDM   Final diagnoses:  SOB (shortness of breath)  Acute on chronic congestive heart failure, unspecified congestive heart failure type [facility](HCC)   70 year old female here with shortness of breath and concern for STEMI. See history of present illness for details. On exam patient in NAD, afebrile, vital stable. 1+ pitting edema to bilateral lower extremities, JVD, basilar lung crackles bilaterally. More consistent with CHF exacerbation than STEMI. STEMI aborted.   Lactate >3. Given small boluses given CHF exacerbation.  Cardiology to admit for CHF exacerbation. (Patient received Lasix at outside hospital.)   Pt was seen under the supervision of Dr. Adela LankFloyd.      Rachelle HoraKeri Wacey Zieger, MD 12/09/2015 1911  Melene Planan Floyd, DO 12/09/2015 1912

## 2015-12-05 NOTE — H&P (Signed)
Cardiology Consultation Note    Patient ID: DWAYNA KENTNER MRN: [id number], DOB: [birth year] Date of Encounter: 12/23/2015, 4:33 PM Primary Physician: Alonza Bogus, MD Primary Cardiologist: Dr. Harl Bowie  Chief Complaint: chest pain, SOB, chills Reason for Admission: CHF, elevated troponin Requesting MD: Lovie Macadamia EDP  HPI: Ms. Krontz is a 70 y/o F with history of CAD (cath 12/2014: chronically occluded RCA with angioplasty to occluded mLCx), PAD (Carotids, LE, mesenteric artery stenosis followed by [facility]), sinus bradycardia, dyslipidemia, COPD, DM, GERD who presented to [facility] as a STEMI. Most recent cath 12/2014 with LM mild ostial disease, LAD mid 50%, LCX diffuse disease and occluded in mid vessel, there are left to left collaterals which reconstitute the OM1 and distal LCX, RCA CTO, PDA fills by collateral. The LCX lesion was ballooned, not able to stent the area. The patient has been followed by [facility]VS for significant PVD and was offered right common femoral endarterectomy and right femoral to below knee pop bypass but refused. Last echo 10/2013: EF 50-55%, +WMA c/w ischemic heart disease, grade 1 DD, mild LAE, sclerotic AV, MAC with mildly restricted posterior leaflet and mild mitral regurgitation, mild TR, PASP 67mHg.  [facility] week history of intermittent chest pain she initially thought was indigestion. It would resolve with Tums. She has also been SOB for 1-2 weeks. Last night it got so bad she could hardly sleep. She also reports chills, cough productive of frothy white sputum, and poor appetite. Pt unsure of weight change. Denies palpitations or syncope. [facility] to be [facility] so [facility]. STamala Julianreviewed tracings and history and did not feel this represented a STEMI. Cardiology admitting for suspected CHF. [facility], 223mIV Lasix,  Levaquin, Vancomycin (for suspected PNA), 20 units of regular insulin for CBG 400+ with subsequent hypoglycemia of 49 requiring D50, and heparin. Labwork notable as below. She is presently chest pain free.    Labs: Na 123, K 5.6, Cl 88, CO2 21, BUN 22, Cr 0.71, glucose 432, alk phos 183, AST 87, ALT 132. WBC 7.9, Hgb 10.6, Hct 32.4, Plt 203, INR 1.1. Troponins 1.51, 1.86, 2.04 pBNP 13637 CXR: cardiomegaly with interstitial edema and early aveolar edema, c/w acute congestive heart failure.   Past Medical History  Diagnosis Date  . Coronary artery disease     a. Severe single-vessel/100% occluded right with distal collateralization.Medical therapy/Normal LV fx b. cath 01/22/2015 chronically occluded RCA, LVEDP 16101m, balloon angioplasty to occluded mid LCx   . Carotid bruit     Bilateral  . Sinus bradycardia   . Dyslipidemia   . DVT (deep venous thrombosis) (HCC)     Remote portable as him   . COPD (chronic obstructive pulmonary disease) (HCC[place] . Leg pain   . Productive cough   . Peripheral arterial disease ([place]   a. LE PVD - was offered right common femoral endarterectomy and right femoral to below knee pop bypass but refused. b. Also followed for celiac stenosis, carotid stenosis by [facility].  . Carotid artery occlusion   . Anxiety   . Depression   . Type II diabetes mellitus (HCCMiddle[place] . Umbilical hernia   . GERD (gastroesophageal reflux disease)     "one dr says yes; one says no"  . Arthritis     "just about q where"  . Claustrophobia  Surgical History:  Past Surgical History  Procedure Laterality Date  . Parotidectomy Left   . Cystectomy      "2 off my right posterior shoulder; 2 off my left neck"  . Appendectomy  05/1975  . Aortogram      with bilateral lower extremity run-off  . Colonoscopy  12/31/2010    DJS:HFWY tortuous, but otherwise normal-appearing colon/ Normal rectum  . Esophagogastroduodenoscopy (egd) with propofol N/A 05/16/2014    OVZ:CHYIFOYDXA inlet  patch otherwise normal/s/p dilation as described above. Antral erosions and a single gastric ulcer s/p gastric bx. mild chronic gastritis. no h.pylori  . Esophageal dilation N/A 05/16/2014    Procedure: ESOPHAGEAL DILATION;  Surgeon: Daneil Dolin, MD;  Location: [facility];  Service: Endoscopy;  Laterality: N/A;  #56 maloney  . Esophageal biopsy N/A 05/16/2014    Procedure: BIOPSY of stomach;  Surgeon: Daneil Dolin, MD;  Location: [facility];  Service: Endoscopy;  Laterality: N/A;  . Abdominal angiogram N/A 11/12/2011    Procedure: ABDOMINAL ANGIOGRAM;  Surgeon: Elam Dutch, MD;  Location: [facility] CATH LAB;  Service: Cardiovascular;  Laterality: N/A;  . Visceral angiogram  11/12/2011    Procedure: VISCERAL ANGIOGRAM;  Surgeon: Elam Dutch, MD;  Location: [facility] CATH LAB;  Service: Cardiovascular;;  . Lower extremity angiogram Bilateral 11/12/2011    Procedure: LOWER EXTREMITY ANGIOGRAM;  Surgeon: Elam Dutch, MD;  Location: [facility] CATH LAB;  Service: Cardiovascular;  Laterality: Bilateral;  . Left heart catheterization with coronary angiogram N/A 11/13/2013    Procedure: LEFT HEART CATHETERIZATION WITH CORONARY ANGIOGRAM;  Surgeon: Minus Breeding, MD;  Location: [facility] CATH LAB;  Service: Cardiovascular;  Laterality: N/A;  . Abdominal aortagram N/A 07/19/2014    Procedure: ABDOMINAL Maxcine Ham;  Surgeon: Elam Dutch, MD;  Location: [facility] CATH LAB;  Service: Cardiovascular;  Laterality: N/A;  . Cardiac catheterization  X 3  . Coronary angioplasty  01/22/2015  . Tonsillectomy and adenoidectomy    . Abdominal hysterectomy  05/1966    "partial w/left tube & ovary"  . Salpingoophorectomy Right 05/1966    "few days after hyster"  . Percutaneous coronary stent intervention (pci-s) N/A 01/22/2015    Procedure: PERCUTANEOUS CORONARY STENT INTERVENTION (PCI-S);  Surgeon: Jettie Booze, MD;  Location: [facility] CATH LAB;  Service: Cardiovascular;  Laterality: N/A;  . Cardiac catheterization  01/22/2015    Procedure:  CORONARY/BYPASS GRAFT CTO INTERVENTION;  Surgeon: Jettie Booze, MD;  Location[facility];  Service: Cardiovascular;;     Home Meds: Prior to Admission medications   Medication Sig Start Date End Date Taking? Authorizing Provider  albuterol (2.5 MG/3ML) 0.083% NEBU 3 mL, albuterol (5 MG/ML) 0.5% NEBU 0.5 mL Inhale 2.5 mg into the lungs every 6 (six) hours as needed (congestion).     Historical Provider, MD  albuterol (VENTOLIN HFA) 108 (90 BASE) MCG/ACT inhaler Inhale 2 puffs into the lungs every 6 (six) hours as needed for wheezing or shortness of breath.     Historical Provider, MD  ALPRAZolam Duanne Moron) 1 MG tablet Take 0.5-1 mg by mouth 4 (four) times daily as needed for anxiety. For nerves    Historical Provider, MD  amLODipine (NORVASC) 5 MG tablet Take 1 tablet (5 mg total) by mouth daily. 11/03/15   Arnoldo Lenis, MD  aspirin 81 MG tablet Take 81 mg by mouth daily.      Historical Provider, MD  clopidogrel (PLAVIX) 75 MG tablet Take 1 tablet (75 mg total) by mouth daily with breakfast.  11/20/15   Arnoldo Lenis, MD  Dextromethorphan-Guaifenesin (DIABETIC TUSSIN DM MAX ST PO) Take by mouth as needed. Per phone call from patient after office visit 03-27-15    Historical Provider, MD  furosemide (LASIX) 20 MG tablet Take 40 mg by mouth daily as needed for fluid. Per patient is taking 40 mg    Historical Provider, MD  metFORMIN (GLUCOPHAGE) 500 MG tablet Take 500 mg by mouth 2 (two) times daily.     Historical Provider, MD  metoprolol succinate (TOPROL-XL) 25 MG 24 hr tablet Take 12.5 mg by mouth every morning.     Historical Provider, MD  Multiple Vitamins-Minerals (MULTIVITAMIN PO) Take by mouth daily.    Historical Provider, MD  nitroGLYCERIN (NITROSTAT) 0.4 MG SL tablet Place 1 tablet (0.4 mg total) under the tongue every 5 (five) minutes as needed. For chest pain. 04/06/12   Donney Dice, PA-C  oxyCODONE-acetaminophen (ENDOCET) 10-325 MG per tablet Take 0.5-1 tablets by mouth  every 4 (four) hours as needed for pain.     Historical Provider, MD  potassium chloride (KLOR-CON) 10 MEQ CR tablet Take 10 mEq by mouth as needed. When taking Furosemide    Historical Provider, MD  Tetrahydroz-Dextran-PEG-Povid (EYE DROPS ADVANCED RELIEF OP) Apply 1 drop to eye daily as needed (dry eyes).    Historical Provider, MD    Allergies:  Allergies  Allergen Reactions  . Penicillins Anaphylaxis  . Sulfonamide Derivatives Anaphylaxis  . Alupent [Metaproterenol Sulfate] Other (See Comments)    Makes pt "nervous"  . Azithromycin Other (See Comments)    unknown  . Linzess [Linaclotide] Nausea And Vomiting  . Omnipaque [Iohexol] Other (See Comments)    Pt states can take mix Dye and Metformin together.  Doesn't have SOB or rash  . Pantoprazole Nausea And Vomiting  . Paroxetine Other (See Comments)    unknown  . Varenicline Tartrate Other (See Comments)    unknown  . Benadryl [Diphenhydramine Hcl] Other (See Comments)    Can't breath  . Hydrocortisone Hives  . Prednisone Hives    Social History   Social History  . Marital Status: Widowed    Spouse Name: N/A  . Number of Children: N/A  . Years of Education: N/A   Occupational History  . Not on file.   Social History Main Topics  . Smoking status: Current Some Day Smoker -- 0.50 packs/day for 45 years    Types: Cigarettes    Start date: 01/06/1959  . Smokeless tobacco: Never Used  . Alcohol Use: No  . Drug Use: No  . Sexual Activity: No   Other Topics Concern  . Not on file   Social History Narrative     Family History  Problem Relation Age of Onset  . Cancer Maternal Grandmother     liver  . Cancer Maternal Grandfather     lung  . Diabetes Mother   . Diabetes Son     Review of Systems: no bleeding or fever.  All other systems reviewed and are otherwise negative except as noted above.  Labs:   Lab Results  Component Value Date   WBC 5.3 01/23/2015   HGB 11.0* 01/23/2015   HCT 33.3* 01/23/2015    MCV 81.8 01/23/2015   PLT 175 01/23/2015   No results for input(s): NA, K, CL, CO2, BUN, CREATININE, CALCIUM, PROT, BILITOT, ALKPHOS, ALT, AST, GLUCOSE in the last 168 hours.  Invalid input(s): LABALBU No results for input(s): CKTOTAL, CKMB, TROPONINI in the last 72  hours. No results found for: CHOL, HDL, LDLCALC, TRIG No results found for: DDIMER  Radiology/Studies:  No results found. Wt Readings from Last 3 Encounters:  11/13/15 104 lb (47.174 kg)  10/28/15 103 lb (46.72 kg)  09/22/15 101 lb 12.8 oz (46.176 kg)    EKG: NSR 86bpm RBBB ? Possible subtle ST elevation in lead III  Physical Exam: Blood pressure 96/60, pulse 82, temperature 97.8 F (36.6 C), temperature source Oral, resp. rate 16, SpO2 97 %. There is no weight on file to calculate BMI. General: Thin elderly WF, in no acute distress. Head: Normocephalic, atraumatic, sclera non-icteric, no xanthomas, nares are without discharge.  Neck: JVD mildly elevated. Lungs: Coarse crackles at bases. Moderate air movement. No wheezes or rhonchi. Breathing is unlabored. Heart: RRR with S1 S2. No murmurs, rubs, or gallops appreciated. Abdomen: Soft, non-tender, non-distended with normoactive bowel sounds. No hepatomegaly. No rebound/guarding. No obvious abdominal masses. Msk:  Strength and tone appear normal for age. Extremities: No clubbing or cyanosis. No edema.  Distal pedal pulses are 2+ and equal bilaterally. Neuro: Alert and oriented X 3. No focal deficit. No facial asymmetry. Moves all extremities spontaneously. Psych:  Responds to questions appropriately with a normal affect.    Assessment and Plan   1. Acute CHF, not currently known if systolic or diastolic - per d/w MD, clinical presentation more c/w CHF. Check stat echo to eval LV function. Initiate Lasix 50m BID. Will ask pharmacy to reconcile home meds with most accurate list. If she is on ACEI, will hold (or not add) due to hyperkalemia at this time. Would also hold  amlodipine due to softer BP. At present time Dr. STamala Julianfeels her presentation is more likely CHF than PNA. If she mounts a fever or elevated WBC would consult IM to help guide antibiotic therapy. Otherwise will hold off.   2. NSTEMI with history of CAD as above - May represent smoldering ACS but code STEMI called off. Continue IV heparin and aspirin. Trend LFTs before adding statin. She is presently chest pain free. Will ask nursing to confirm that she [facility] not, will be administered.  3. Hyponatremia, question r/t #1 - appears hypotonic hypervolemic. Follow. Suspect subacute given lack of any mental status changes.  4. Transaminitis, question r/t #1 - follow.  5. Hyperkalemia - recheck BMET. Hold KCl.   6. Diabetes mellitus with marked hyperglycemia then hypoglycemia - hold Metformin in case of cath this admission. Add SSI. If CBGs begin to rise again would need IM assistance to help manage.   Signed, Charlie PitterA-C 12/30/2015, 4:33 PM Pager: [phone]

## 2015-12-05 NOTE — ED Notes (Signed)
[facility].  Heparin gtt on arrival at 800 units/hr.  Pt has had lipitor, levaquin,  Lasix, vanc 1gm, and 20 units regular insulin.  Pt was given amp D50 prior to transport because cbg was 49.  Dr. Katrinka BlazingSmith and Cards PA saw patient in ED upon arrival and no plan to cath right now.   Pt has had sob and cough times one week with elevated blood sugar and decreased blood pressure.  Trop 2.04, BNP 13637, BUN 22, Na 123, potassium 5.3, blood sugar >400.   Pt had 324mg  asa prior to arrival.  Pt alert and talking on arrival.

## 2015-12-05 NOTE — Progress Notes (Signed)
CRITICAL VALUE ALERT  Critical value received:  Troponin 9.04  Date of notification:  12/13/2015  Time of notification:  2043  Critical value read back:Yes.    Nurse who received alert:  Owens LofflerKaziah Miller, RN   MD notified (1st page):  Azeem  Time of first page:  2045  MD notified (2nd page):   Time of second page:  Responding MD:  Tarri GlennAzeem   Time MD responded:  2103

## 2015-12-05 NOTE — Telephone Encounter (Signed)
Tried to call pt- NA- got answering machine this time and left a message with recommendations.

## 2015-12-05 NOTE — Progress Notes (Signed)
ANTICOAGULATION CONSULT NOTE - Initial Consult  Pharmacy Consult for Heparin Indication: chest pain/ACS  Allergies  Allergen Reactions  . Penicillins Anaphylaxis  . Sulfonamide Derivatives Anaphylaxis  . Alupent [Metaproterenol Sulfate] Other (See Comments)    Makes pt "nervous"  . Azithromycin Other (See Comments)    unknown  . Linzess [Linaclotide] Nausea And Vomiting  . Omnipaque [Iohexol] Other (See Comments)    Pt states can take mix Dye and Metformin together.  Doesn't have SOB or rash  . Pantoprazole Nausea And Vomiting  . Paroxetine Other (See Comments)    unknown  . Varenicline Tartrate Other (See Comments)    unknown  . Benadryl [Diphenhydramine Hcl] Other (See Comments)    Can't breath  . Hydrocortisone Hives  . Prednisone Hives    Patient Measurements:   Heparin Dosing Weight: 47 kg  Vital Signs: Temp: 97.8 F (36.6 C) (01/06 1628) Temp Source: Oral (01/06 1628) BP: 105/71 mmHg (01/06 1645) Pulse Rate: 81 (01/06 1645)  Labs: No results for input(s): HGB, HCT, PLT, APTT, LABPROT, INR, HEPARINUNFRC, CREATININE, CKTOTAL, CKMB, TROPONINI in the last 72 hours.  CrCl cannot be calculated (Unknown ideal weight.).   Medical History: Past Medical History  Diagnosis Date  . Coronary artery disease     a. Severe single-vessel/100% occluded right with distal collateralization.Medical therapy/Normal LV fx b. cath 01/22/2015 chronically occluded RCA, LVEDP 16mmHg, balloon angioplasty to occluded mid LCx   . Carotid bruit     Bilateral  . Sinus bradycardia   . Dyslipidemia   . DVT (deep venous thrombosis) (HCC)     Remote portable as him   . COPD (chronic obstructive pulmonary disease) (HCC)   . Leg pain   . Productive cough   . Peripheral arterial disease (HCC)     a. LE PVD - was offered right common femoral endarterectomy and right femoral to below knee pop bypass but refused. b. Also followed for celiac stenosis, carotid stenosis by [facility].  . Carotid artery  occlusion   . Anxiety   . Depression   . Type II diabetes mellitus (HCC)   . Umbilical hernia   . GERD (gastroesophageal reflux disease)     "one dr says yes; one says no"  . Arthritis     "just about q where"  . Claustrophobia     Medications:   (Not in a hospital admission) Scheduled:  . aspirin  324 mg Oral Once   Infusions:    Assessment: 70[facility] [place] [facility]. Pharmacy is consulted to dose heparin for ACS/chest pain. Hgb 11.0, Plt wnl, sCr wnl.  [facility]  Goal of Therapy:  Heparin level 0.3-0.7 units/ml Monitor platelets by anticoagulation protocol: Yes   Plan:  Start heparin infusion at 550 units/hr Check anti-Xa level in 8 hours and daily while on heparin Continue to monitor H&H and platelets  Arlean Hoppingorey M. Newman PiesBall, PharmD, BCPS Clinical Pharmacist Pager [phone] 12/23/2015,5:03 PM

## 2015-12-05 NOTE — ED Notes (Addendum)
Dr Henry Smith at bedside.  

## 2015-12-05 NOTE — ED Notes (Signed)
[facility] replaced the Heparin bag that patient arrived with and rate changed to 550 units/hr.

## 2015-12-05 NOTE — ED Provider Notes (Signed)
I have personally seen and examined the patient.  I have discussed the plan of care with the resident.  I have reviewed the documentation on PMH/FH/Soc. History.  I have reviewed the documentation of the resident and agree.   EKG Interpretation  Date/Time:  Friday December 05 2015 16:34:08 EST Ventricular Rate:  77 PR Interval:  144 QRS Duration: 149 QT Interval:  418 QTC Calculation: 473 R Axis:   118 Text Interpretation:  Sinus rhythm Right bundle branch block No significant change since last tracing Confirmed by Shylie Polo MD, DANIEL [phone]) on 12/30/2015 5:34:59 PM       70 yo F with a chief complaints of chest pain and shortness of breath. This been ongoing the last 3 days. Patient transferred from outside hospital with concern for possible STEMI. No STEMI on arrival seen by cardiology is a CHF exacerbation. On exam with 2+ pedal edema bilaterally JVD up to the angle of the jaw and rales up to the clavicles. Cardiology will admit. Troponin positive at 6.6.   CRITICAL CARE Performed by: Rae Roamaniel Patrick Hunner Garcon   Total critical care time: 35 minutes  Critical care time was exclusive of separately billable procedures and treating other patients.  Critical care was necessary to treat or prevent imminent or life-threatening deterioration.  Critical care was time spent personally by me on the following activities: development of treatment plan with patient and/or surrogate as well as nursing, discussions with consultants, evaluation of patient's response to treatment, examination of patient, obtaining history from patient or surrogate, ordering and performing treatments and interventions, ordering and review of laboratory studies, ordering and review of radiographic studies, pulse oximetry and re-evaluation of patient's condition.   Melene Planan Anuoluwapo Mefferd, DO 12/21/2015 1912

## 2015-12-05 NOTE — ED Notes (Addendum)
Dr. Verdis PrimeHenry Smith states NO STEMI

## 2015-12-05 NOTE — Progress Notes (Signed)
  Echocardiogram 2D Echocardiogram has been performed.  Delcie RochENNINGTON, Jessica Seidman 12/29/2015, 5:59 PM

## 2015-12-06 ENCOUNTER — Encounter (HOSPITAL_COMMUNITY): Payer: Self-pay | Admitting: Certified Registered Nurse Anesthetist

## 2015-12-06 ENCOUNTER — Inpatient Hospital Stay (HOSPITAL_COMMUNITY): Payer: Medicare Other

## 2015-12-06 DIAGNOSIS — I214 Non-ST elevation (NSTEMI) myocardial infarction: Secondary | ICD-10-CM | POA: Diagnosis not present

## 2015-12-06 DIAGNOSIS — I469 Cardiac arrest, cause unspecified: Secondary | ICD-10-CM

## 2015-12-06 LAB — BASIC METABOLIC PANEL
ANION GAP: 14 (ref 5–15)
BUN: 23 mg/dL — ABNORMAL HIGH (ref 6–20)
CHLORIDE: 92 mmol/L — AB (ref 101–111)
CO2: 22 mmol/L (ref 22–32)
Calcium: 8.8 mg/dL — ABNORMAL LOW (ref 8.9–10.3)
Creatinine, Ser: 0.95 mg/dL (ref 0.44–1.00)
GFR calc non Af Amer: 59 mL/min — ABNORMAL LOW (ref >60)
Glucose, Bld: 321 mg/dL — ABNORMAL HIGH (ref 65–99)
POTASSIUM: 4.6 mmol/L (ref 3.5–5.1)
SODIUM: 128 mmol/L — AB (ref 135–145)

## 2015-12-06 LAB — CBC
HEMATOCRIT: 32.2 % — AB (ref 36.0–46.0)
Hemoglobin: 11.1 g/dL — ABNORMAL LOW (ref 12.0–15.0)
MCH: 28 pg (ref 26.0–34.0)
MCHC: 34.5 g/dL (ref 30.0–36.0)
MCV: 81.3 fL (ref 78.0–100.0)
PLATELETS: 208 10*3/uL (ref 150–400)
RBC: 3.96 MIL/uL (ref 3.87–5.11)
RDW: 13 % (ref 11.5–15.5)
WBC: 9.4 10*3/uL (ref 4.0–10.5)

## 2015-12-06 LAB — URINE MICROSCOPIC-ADD ON

## 2015-12-06 LAB — HEPATIC FUNCTION PANEL
ALT: 123 U/L — ABNORMAL HIGH (ref 14–54)
AST: 90 U/L — ABNORMAL HIGH (ref 15–41)
Albumin: 3.3 g/dL — ABNORMAL LOW (ref 3.5–5.0)
Alkaline Phosphatase: 202 U/L — ABNORMAL HIGH (ref 38–126)
Bilirubin, Direct: 0.2 mg/dL (ref 0.1–0.5)
Indirect Bilirubin: 0.4 mg/dL (ref 0.3–0.9)
Total Bilirubin: 0.6 mg/dL (ref 0.3–1.2)
Total Protein: 5.5 g/dL — ABNORMAL LOW (ref 6.5–8.1)

## 2015-12-06 LAB — HEPARIN LEVEL (UNFRACTIONATED)
HEPARIN UNFRACTIONATED: 0.45 [IU]/mL (ref 0.30–0.70)
Heparin Unfractionated: 0.11 IU/mL — ABNORMAL LOW (ref 0.30–0.70)

## 2015-12-06 LAB — URINALYSIS, ROUTINE W REFLEX MICROSCOPIC
Bilirubin Urine: NEGATIVE
GLUCOSE, UA: NEGATIVE mg/dL
HGB URINE DIPSTICK: NEGATIVE
Ketones, ur: NEGATIVE mg/dL
Nitrite: NEGATIVE
Protein, ur: NEGATIVE mg/dL
SPECIFIC GRAVITY, URINE: 1.01 (ref 1.005–1.030)
pH: 5 (ref 5.0–8.0)

## 2015-12-06 LAB — GLUCOSE, CAPILLARY
GLUCOSE-CAPILLARY: 292 mg/dL — AB (ref 65–99)
GLUCOSE-CAPILLARY: 141 mg/dL — AB (ref 65–99)

## 2015-12-06 LAB — TROPONIN I
Troponin I: 8.3 ng/mL (ref <0.031)
Troponin I: 8.61 ng/mL (ref <0.031)

## 2015-12-06 MED ORDER — LEVOFLOXACIN 250 MG PO TABS
250.0000 mg | ORAL_TABLET | Freq: Every day | ORAL | Status: DC
  Filled 2015-12-06: qty 1

## 2015-12-06 MED ORDER — VASOPRESSIN 20 UNIT/ML IV SOLN
0.0300 [IU]/min | INTRAVENOUS | Status: DC
  Administered 2015-12-06: 0.03 [IU]/min via INTRAVENOUS
  Filled 2015-12-06: qty 2

## 2015-12-06 MED ORDER — LISINOPRIL 2.5 MG PO TABS
2.5000 mg | ORAL_TABLET | Freq: Every day | ORAL | Status: DC
  Filled 2015-12-06: qty 1

## 2015-12-06 MED ORDER — LEVOFLOXACIN 250 MG PO TABS
250.0000 mg | ORAL_TABLET | Freq: Every day | ORAL | Status: DC
  Administered 2015-12-06: 250 mg via ORAL
  Filled 2015-12-06: qty 1

## 2015-12-06 MED ORDER — CETYLPYRIDINIUM CHLORIDE 0.05 % MT LIQD
7.0000 mL | Freq: Two times a day (BID) | OROMUCOSAL | Status: DC
  Administered 2015-12-06: 7 mL via OROMUCOSAL

## 2015-12-06 MED ORDER — MORPHINE SULFATE (PF) 4 MG/ML IV SOLN
INTRAVENOUS | Status: AC
  Administered 2015-12-06: 16:00:00
  Filled 2015-12-06: qty 1

## 2015-12-06 MED ORDER — CARVEDILOL 3.125 MG PO TABS: 3.1250 mg | ORAL_TABLET | Freq: Two times a day (BID) | ORAL | Status: DC

## 2015-12-06 MED ORDER — ATORVASTATIN CALCIUM 10 MG PO TABS: 10.0000 mg | ORAL_TABLET | Freq: Every day | ORAL | Status: DC

## 2015-12-06 MED ORDER — FUROSEMIDE 10 MG/ML IJ SOLN
40.0000 mg | Freq: Every day | INTRAMUSCULAR | Status: DC
  Administered 2015-12-06: 40 mg via INTRAVENOUS
  Filled 2015-12-06: qty 4

## 2015-12-06 MED ORDER — EPINEPHRINE HCL 0.1 MG/ML IJ SOSY
PREFILLED_SYRINGE | INTRAMUSCULAR | Status: AC
Start: 2015-12-06 — End: 2015-12-06
  Administered 2015-12-06: 16:00:00
  Filled 2015-12-06: qty 20

## 2015-12-06 MED ORDER — HEPARIN BOLUS VIA INFUSION
2000.0000 [IU] | Freq: Once | INTRAVENOUS | Status: AC
  Administered 2015-12-06: 2000 [IU] via INTRAVENOUS
  Filled 2015-12-06: qty 2000

## 2015-12-06 MED ORDER — BENZONATATE 100 MG PO CAPS
100.0000 mg | ORAL_CAPSULE | Freq: Two times a day (BID) | ORAL | Status: AC | PRN
  Administered 2015-12-06 (×2): 100 mg via ORAL
  Filled 2015-12-06 (×2): qty 1

## 2015-12-08 DIAGNOSIS — R0689 Other abnormalities of breathing: Secondary | ICD-10-CM

## 2015-12-08 DIAGNOSIS — I4901 Ventricular fibrillation: Secondary | ICD-10-CM

## 2015-12-08 DIAGNOSIS — I739 Peripheral vascular disease, unspecified: Secondary | ICD-10-CM

## 2015-12-08 DIAGNOSIS — I469 Cardiac arrest, cause unspecified: Secondary | ICD-10-CM

## 2015-12-08 DIAGNOSIS — J209 Acute bronchitis, unspecified: Secondary | ICD-10-CM

## 2015-12-08 LAB — GLUCOSE, CAPILLARY: GLUCOSE-CAPILLARY: 153 mg/dL — AB (ref 65–99)

## 2015-12-09 MED FILL — Medication: Qty: 1 | Status: AC

## 2015-12-10 ENCOUNTER — Telehealth: Payer: Self-pay | Admitting: Cardiology

## 2015-12-10 MED FILL — Heparin Sodium (Porcine) 100 Unt/ML in Sodium Chloride 0.45%: INTRAMUSCULAR | Qty: 250 | Status: AC

## 2015-12-10 NOTE — Telephone Encounter (Signed)
Received death certificate on Melissa Holland after researching the death certificate [facility]. [name] who is patient primary Cardiologist so I faxed a copy of the death certificate to Dr. Wyline MoodBranch office and will put original in [facility]rier [facility] [place].

## 2015-12-12 NOTE — Telephone Encounter (Signed)
Dr. Wyline MoodBranch signed the original death certificate.  I have scanned and emailed a copy to Carroll KindsMichelle Callaham in our HIM department.  I have called [employer] and [facility] [phone]) to notify them. They are coming to pick up the document at our [place] location ([facility] of [facility]) this afternoon. Kathlene NovemberMike confirmed they do not need me to fax them a signed copy.

## 2015-12-30 ENCOUNTER — Ambulatory Visit: Payer: Medicare Other | Admitting: Cardiology

## 2015-12-31 NOTE — Progress Notes (Signed)
ANTICOAGULATION CONSULT NOTE  Pharmacy Consult for Heparin Indication: chest pain/ACS  Allergies  Allergen Reactions  . Benadryl [Diphenhydramine Hcl] Hives and Shortness Of Breath  . Hydrocortisone Hives, Shortness Of Breath and Swelling    Eyes swelling  . Penicillins Anaphylaxis    Has patient had a PCN reaction causing immediate rash, facial/tongue/throat swelling, SOB or lightheadedness with hypotension: Yes Has patient had a PCN reaction causing severe rash involving mucus membranes or skin necrosis: No Has patient had a PCN reaction that required hospitalization No Has patient had a PCN reaction occurring within the last 10 years: Yes If all of the above answers are "NO", then may proceed with Cephalosporin use.  . Prednisone Hives, Shortness Of Breath and Swelling    Eyes swelling  . Sulfonamide Derivatives Anaphylaxis  . Alupent [Metaproterenol Sulfate] Other (See Comments)    Makes pt "nervous"  . Azithromycin Diarrhea and Other (See Comments)    Stomach pain  . Doxycycline Other (See Comments)    Doesn't work   . Linzess [Linaclotide] Nausea And Vomiting  . Omnipaque [Iohexol] Other (See Comments)    Cannot take with metformin  . Pantoprazole Nausea And Vomiting  . Paroxetine Other (See Comments)    Caused more depression  . Varenicline Tartrate Other (See Comments)    hallucinations    Patient Measurements: Height: 5\' 3"  (160 cm) Weight: 109 lb 5.6 oz (49.6 kg) IBW/kg (Calculated) : 52.4 Heparin Dosing Weight: 47 kg  Vital Signs: Temp: 97.5 F (36.4 C) (01/07 0008) Temp Source: Oral (01/07 0008) BP: 92/63 mmHg (01/07 0200) Pulse Rate: 82 (01/07 0200)  Labs:  Recent Labs  12/18/2015 1720 12/18/2015 1941 02/28/16 0100  HGB 11.1* 11.4* 11.1*  HCT 32.4* 33.4* 32.2*  PLT 231 234 208  HEPARINUNFRC  --   --  0.11*  CREATININE 0.78 0.67 0.95  TROPONINI  --  9.04* 8.30*    Estimated Creatinine Clearance: 42.5 mL/min (by C-G formula based on Cr of  0.95).  Assessment: 70 y.o. female with SOB/CHF, elevated cardiac markers  for heparin   Goal of Therapy:  Heparin level 0.3-0.7 units/ml Monitor platelets by anticoagulation protocol: Yes   Plan:  Heparin 2000 units IV bolus, then increase heparin  750 units/hr Check heparin level in 8 hours.  Geannie RisenGreg Oluwadarasimi Redmon, PharmD, BCPS  12/10/2015,3:02 AM

## 2015-12-31 NOTE — Procedures (Signed)
Intubation Procedure Note Melissa Holland [id number] 04/18/1946  Procedure: Intubation Indications: Respiratory insufficiency  Procedure Details Consent: Unable to obtain consent because of emergent medical necessity. Time Out: Verified patient identification, verified procedure, site/side was marked, verified correct patient position, special equipment/implants available, medications/allergies/relevent history reviewed, required imaging and test results available.  Performed  Drugs Etomidate 20mg , Rocuronium 50mg  IV1 DL x 1 with MAC 3 blade Grade 2 view 8.0 ET tube passed through cords under direct visualization Placement confirmed with bilateral breath sounds, positive EtCO2 change and smoke in tube   Evaluation Hemodynamic Status: BP stable throughout; O2 sats: Hypoxemic due to cardiac arrest Patient's Current Condition: unstable Complications: No apparent complications Patient did tolerate procedure well. Chest X-ray ordered to verify placement.  CXR: tube position acceptable.   Max FickleDouglas McQuaid 12/30/2015

## 2015-12-31 NOTE — Progress Notes (Signed)
ANTICOAGULATION CONSULT NOTE  Pharmacy Consult for Heparin Indication: chest pain/ACS  Patient Measurements: Height: 5\' 3"  (160 cm) Weight: 109 lb 6.4 oz (49.624 kg) IBW/kg (Calculated) : 52.4 Heparin Dosing Weight: 47 kg  Vital Signs: Temp: 97.5 F (36.4 C) (01/07 0804) Temp Source: Oral (01/07 0804) BP: 91/58 mmHg (01/07 1100) Pulse Rate: 74 (01/07 1100)  Labs:  Recent Labs  Dec 24, 2015 1720 Dec 24, 2015 1941 12/04/2015 0100 12/23/2015 1030  HGB 11.1* 11.4* 11.1*  --   HCT 32.4* 33.4* 32.2*  --   PLT 231 234 208  --   HEPARINUNFRC  --   --  0.11* 0.45  CREATININE 0.78 0.67 0.95  --   TROPONINI  --  9.04* 8.30*  --     Estimated Creatinine Clearance: 42.5 mL/min (by C-G formula based on Cr of 0.95).  Assessment: 70 y.o. female presented with chest pain, CHF. Now with NSTEMI with elevated troponins.  HL therapeutic at 750 units/hr. No s/x bleeding reported.  Goal of Therapy:  Heparin level 0.3-0.7 units/ml Monitor platelets by anticoagulation protocol: Yes   Plan:  Continue heparin 750 units/hr Check heparin level in 8 hours to confirm Daily HL, CBC, s/sx bleeding  Greggory Stallionristy Reyes, PharmD Clinical Pharmacy Resident Pager # [phone] 12/03/2015 12:08 PM

## 2015-12-31 NOTE — Progress Notes (Signed)
   12/26/2015 1551  Clinical Encounter Type  Visited With Patient not available;Health care provider  Visit Type Initial;Code;Death   Chaplain responded to a code Blue in 2H. Patient's family is roughly three hours away, and may not make it today due to weather conditions. Chaplain support available as needed.   Alda Ponderdam M Dion Parrow, Chaplain 12/25/2015 3:53 PM

## 2015-12-31 NOTE — Consult Note (Signed)
[facility]  Consult  HPI: I was called emergently to the bedside to assess Ms. Repinski for a cardiac arrest.  She was listed as a STEMI and was brought here for possible LHC. Based on EKG findings she did not undergo LHC.  She was chest pain free today, but coughing.  Today she coughed and suddenly went into PEA arrest.  Upon my arrival to the bedside she had been receiving CPR for 12 minutes.  Past Medical History  Diagnosis Date  . Coronary artery disease     a. Severe single-vessel/100% occluded right with distal collateralization.Medical therapy/Normal LV fx b. cath 01/22/2015 chronically occluded RCA, LVEDP 16mmHg, balloon angioplasty to occluded mid LCx   . Carotid bruit     Bilateral  . Sinus bradycardia   . Dyslipidemia   . DVT (deep venous thrombosis) (HCC)     Remote portable as him   . COPD (chronic obstructive pulmonary disease) (HCC)   . Leg pain   . Productive cough   . Peripheral arterial disease (HCC)     a. LE PVD - was offered right common femoral endarterectomy and right femoral to below knee pop bypass but refused. b. Also followed for celiac stenosis, carotid stenosis by [facility].  . Carotid artery occlusion   . Anxiety   . Depression   . Type II diabetes mellitus (HCC)   . Umbilical hernia   . GERD (gastroesophageal reflux disease)     "one dr says yes; one says no"  . Arthritis     "just about q where"  . Claustrophobia      Family History  Problem Relation Age of Onset  . Cancer Maternal Grandmother     liver  . Cancer Maternal Grandfather     lung  . Diabetes Mother   . Diabetes Son      Social History   Social History  . Marital Status: Widowed    Spouse Name: N/A  . Number of Children: N/A  . Years of Education: N/A   Occupational History  . Not on file.   Social History Main Topics  . Smoking status: Current Some Day Smoker -- 0.50 packs/day for 45 years    Types: Cigarettes    Start date: 01/06/1959  . Smokeless tobacco: Never Used  .  Alcohol Use: No  . Drug Use: No  . Sexual Activity: No   Other Topics Concern  . Not on file   Social History Narrative     Allergies  Allergen Reactions  . Benadryl [Diphenhydramine Hcl] Hives and Shortness Of Breath  . Hydrocortisone Hives, Shortness Of Breath and Swelling    Eyes swelling  . Penicillins Anaphylaxis    Has patient had a PCN reaction causing immediate rash, facial/tongue/throat swelling, SOB or lightheadedness with hypotension: Yes Has patient had a PCN reaction causing severe rash involving mucus membranes or skin necrosis: No Has patient had a PCN reaction that required hospitalization No Has patient had a PCN reaction occurring within the last 10 years: Yes If all of the above answers are "NO", then may proceed with Cephalosporin use.  . Prednisone Hives, Shortness Of Breath and Swelling    Eyes swelling  . Sulfonamide Derivatives Anaphylaxis  . Alupent [Metaproterenol Sulfate] Other (See Comments)    Makes pt "nervous"  . Azithromycin Diarrhea and Other (See Comments)    Stomach pain  . Doxycycline Other (See Comments)    Doesn't work   . Linzess [Linaclotide] Nausea And Vomiting  .  Omnipaque [Iohexol] Other (See Comments)    Cannot take with metformin  . Pantoprazole Nausea And Vomiting  . Paroxetine Other (See Comments)    Caused more depression  . Varenicline Tartrate Other (See Comments)    hallucinations     @encmedstart @   Filed Vitals:   12/21/2015 1014 12/22/2015 1100 12/20/2015 1200 12/09/2015 1416  BP: 90/59 91/58 100/63   Pulse:  74 81   Temp:   97.6 F (36.4 C)   TempSrc:   Oral   Resp:  18 24   Height:      Weight:      SpO2:  100% 100% 97%   Gen: chronically ill appearing, receiving CPR HENT: NCAT, pale mucus menbranes Eyes: no icterus Lymph: no cervical lymphadenopathy PULM:  Rhonchi bilaterally, equal CV: no heart tones (receiving CPR) GI: soft, Derm: cold, cyanotic Neuro: moves when receiving CPR, flaccid when not  BMET     Component Value Date/Time   NA 128* 12/27/2015 0100   K 4.6 12/30/2015 0100   CL 92* 12/10/2015 0100   CO2 22 11/30/2015 0100   GLUCOSE 321* 12/17/2015 0100   BUN 23* 12/19/2015 0100   CREATININE 0.95 12/17/2015 0100   CALCIUM 8.8* 12/17/2015 0100   GFRNONAA 59* 12/11/2015 0100   GFRAA >60 12/23/2015 0100    CBC    Component Value Date/Time   WBC 9.4 12/19/2015 0100   RBC 3.96 12/22/2015 0100   HGB 11.1* 12/05/2015 0100   HCT 32.2* 12/04/2015 0100   PLT 208 12/11/2015 0100   MCV 81.3 12/05/2015 0100   MCH 28.0 12/29/2015 0100   MCHC 34.5 12/07/2015 0100   RDW 13.0 12/04/2015 0100   LYMPHSABS 1.0 29-Dec-2015 1941   MONOABS 0.8 12/29/15 1941   EOSABS 0.1 December 29, 2015 1941   BASOSABS 0.0 12/29/2015 1941      CXR> ETT in place, no pneumo, diffuse bilateral infiltrates  Tele: initially sinus tach with aberrancy, then V-fib multiple times, then finally agonal bradycardic rhythms  Cardiac arrest: Initially PEA, then VFIB   Impression/plan/discussion:  CPR procedure note: I personally directed over 20 minutes of CPR, though she received > 30 minutes total.  We followed ACLS algorithm and performed a STAT CXR, STAT chest ultrasound and saw no evidence of pneumothorax.  Labs this morning did not demonstrate acidosis or potassium abnormalities but yesterday's lactic acid was elevated.  We gave BICARB, many rounds of epinephrine, amiodarone, and vasopressin.  She received Dopamine, saline boluses, and a vasopressin infusion.  She did not respond to any of these.  Cardiology came to the bedside and performed a stat echo that did not show tamponade.    At this point (> 30 minutes in) I called her son and explained that there was nothing more we could do as she was not responding to standard ACLS resuscitation.  Our presumption is that her last remaining coronary artery (LAD) was occluded.  As she received rocuronium we provided ventilatory support until she developed asystole,  approximately 6-7 minutes after halting compressions.  Total CCM time apart from procedures 41 minutes  Heber North Lawrence, MD [place] [facility] Pager: [phone] Cell: [phone] After 3pm or if no response, call [phone]

## 2015-12-31 NOTE — Progress Notes (Signed)
Pt requesting something to drink. Entered the room, pt was seated on the side of the bed.AAOx4.  Began coughing, eyes rolled back in head, fell back on bed unresponsive. Code called and CPR initiated. See code sheet for further information.

## 2015-12-31 NOTE — Code Documentation (Signed)
  Patient Name: Melissa Holland   MRN: [id number]   Date of Birth/ Sex: 09/22/1946 , female      Admission Date: 12/10/2015  Attending Provider: Lyn RecordsHenry W Smith, MD  Primary Diagnosis: SOB (shortness of breath) [R06.02] Acute on chronic congestive heart failure, unspecified congestive heart failure type (HCC) [I50.9]    Indication: Pt was in her usual state of health until this PM, when she was noted to be vfib . Code blue was subsequently called. At the time of arrival on scene, ACLS protocol was underway.   Technical Description:  - CPR performance duration:  40  minutes  - Was defibrillation or cardioversion used? Yes   - Was external pacer placed? Yes  - Was patient intubated pre/post CPR? Yes   Medications Administered: Y = Yes; Blank = No Amiodarone  Y  Atropine  Y  Calcium    Epinephrine  Y  Lidocaine    Magnesium    Norepinephrine    Phenylephrine    Sodium bicarbonate    Vasopressin    Other    Post CPR evaluation:  - Final Status - Was patient successfully resuscitated ?  She was intubated, cpr was stopped after 40 minutes, per PCCM, if she is in asystole then she wont be put on vent.    Miscellaneous Information: on dopamine, received 500 cc bolus     - Primary team notified?  Yes  - Family Notified? Yes     [name], MD   12/30/2015, 3:50 PM

## 2015-12-31 NOTE — Discharge Summary (Signed)
Death Summary    Patient ID: BRUNETTE Melissa Holland,  MRN: [id number], DOB/AGE: [birth year] 70 y.o.  Admit date: 12/04/2015 Discharge date: 12/08/2015  Primary Care Provider: HAWKINS,EDWARD L Primary Cardiologist: Dr. Harl Bowie  Discharge Diagnoses    Principal Problem:   Acute systolic CHF (congestive heart failure) ([place]) Active Problems:   NSTEMI (non-ST elevated myocardial infarction) ([place])   CAD S/P percutaneous coronary angioplasty   Hyponatremia   Transaminitis   Hyperkalemia   Diabetes mellitus with circulatory complication (HCC)   PAD (peripheral artery disease) (HCC)   Ventricular fibrillation (HCC)   Pulseless electrical activity (HCC)   Acute bronchitis   Respiratory insufficiency   Allergies Allergies  Allergen Reactions  . Benadryl [Diphenhydramine Hcl] Hives and Shortness Of Breath  . Hydrocortisone Hives, Shortness Of Breath and Swelling    Eyes swelling  . Penicillins Anaphylaxis    Has patient had a PCN reaction causing immediate rash, facial/tongue/throat swelling, SOB or lightheadedness with hypotension: Yes Has patient had a PCN reaction causing severe rash involving mucus membranes or skin necrosis: No Has patient had a PCN reaction that required hospitalization No Has patient had a PCN reaction occurring within the last 10 years: Yes If all of the above answers are "NO", then may proceed with Cephalosporin use.  . Prednisone Hives, Shortness Of Breath and Swelling    Eyes swelling  . Sulfonamide Derivatives Anaphylaxis  . Alupent [Metaproterenol Sulfate] Other (See Comments)    Makes pt "nervous"  . Azithromycin Diarrhea and Other (See Comments)    Stomach pain  . Doxycycline Other (See Comments)    Doesn't work   . Linzess [Linaclotide] Nausea And Vomiting  . Omnipaque [Iohexol] Other (See Comments)    Cannot take with metformin  . Pantoprazole Nausea And Vomiting  . Paroxetine Other (See Comments)    Caused more depression  . Varenicline Tartrate  Other (See Comments)    hallucinations    Diagnostic Studies/Procedures    2D Echo 12/13/2015  - Left ventricle: Distal septal and inferior wall akinesis. The cavity size was severely dilated. Wall thickness was normal. Systolic function was severely reduced. The estimated ejectionfraction was in the range of 20% to 25%. Diffuse hypokinesis. - Mitral valve: There was mild regurgitation.  Left atrium: The atrium was mildly dilated. - Atrial septum: No defect or patent foramen ovale was identified. - Tricuspid valve: There was moderate regurgitation. - Pulmonary arteries: PA peak pressure: 57 mm Hg (S). - Pericardium, extracardiac: A trivial pericardial effusion was identified. _____________   History of Present Illness     Ms. 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  Her most recent prior cath 12/2014 with LM mild ostial disease, LAD mid 50%, LCX diffuse disease and occluded in mid vessel, left to left collaterals which reconstitute the OM1 and distal LCX, RCA CTO, PDA fills by collateral. The LCX lesion was ballooned, not able to stent the area. The patient has been followed by VVS for significant PVD and was offered right common femoral endarterectomy and right femoral to below knee pop bypass but refused. Last echo 10/2013: EF 50-55%, +WMA c/w ischemic heart disease, grade 1 DD, mild LAE, sclerotic AV, MAC with mildly restricted posterior leaflet and mild mitral regurgitation, mild TR, PASP 66mHg.  Hospital Course     She presented to [facility] on 12/05/14 with 1 week history of intermittent chest pain she initially thought was indigestion. It would resolve with Tums. She has also been SOB for 1-2 weeks.  The night before admission it got so bad she could hardly sleep. She also reported chills, cough productive of frothy white sputum, and poor appetite. She was unsure of weight change. Denies palpitations or syncope. At [facility] she received lipitor, 67m IV Lasix, Levaquin, Vancomycin (for suspected PNA), 20 units of regular insulin for CBG 400+ with subsequent hypoglycemia of 49 requiring D50, and heparin. [facility] STamala Julianreviewed tracings and history and did not feel this represented a STEMI as EKG was not consistent with such and clinical presentation was more indicative of acute systolic CHF and non-STEMI. [facility]  Labs: Na 123, K 5.6, Cl 88, CO2 21, BUN 22, Cr 0.71, glucose 432, alk phos 183, AST 87, ALT 132. WBC 7.9, Hgb 10.6, Hct 32.4, Plt 203, INR 1.1. Troponins 1.51, 1.86, 2.04 pBNP 13637 CXR: cardiomegaly with interstitial edema and early aveolar edema, c/w acute congestive heart failure.   Beside echocardiogram revealed EF 20% with global HK, suspected ischemically mediated. It was felt that acute catheterization was not indicated given lack of evidence for ongoing ischemia and infarction and more appropriate to focus on treatment with diuresis to relieve pulmonary edema. The plan was to proceed with cath once patient was more stable, but overall prognosis was guarded as she was felt to be at risk for cardiogenic shock. She was continued on IV heparin per pharmacy and started on IV Lasix. Amlodipine was held due to softer BP. Antibiotics were added for possible acute bronchitis. Her CHF meds were titrated as tolerated by pulse and blood pressure. Troponin peaked at 9 but she denied chest pain throughout her hospitalization. On the afternoon of 12/05/14 she began coughing, her eyes rolled back in her head  and she fell back on the bed, unresponsive. Code notes indicate she went into PEA arrest and immediately CPR was started with ACLS protocol. Telemetry revealed initially sinus tach with aberrancy, then V-fib multiple times, then finally agonal bradycardic rhythms. She was intubated emergently. Dr. MAnastasia Pallnotes indicate: "I personally directed over 20 minutes of CPR, though she received > 30 minutes total. We followed ACLS algorithm and performed a STAT CXR, STAT chest ultrasound and saw no evidence of pneumothorax. Labs this morning did not demonstrate acidosis or potassium abnormalities but yesterday's lactic acid was elevated. We gave BICARB, many rounds of epinephrine, amiodarone, and vasopressin. She received Dopamine, saline boluses, and a vasopressin infusion. She did not respond to any of these. Cardiology came to the bedside and performed a stat echo that did not show tamponade.At this point (> 30 minutes in) I called her son and explained that there was nothing more we could do as she was not responding to standard ACLS resuscitation. Our presumption is that her last remaining coronary artery (LAD) was occluded. As she received rocuronium we provided ventilatory support until she developed asystole, approximately 6-7 minutes after halting compressions." The patient expired.  Consultants: PCCM _____________  Discharge Vitals Blood pressure 100/63, pulse 81, temperature 97.6 F (36.4 C), temperature source Oral, resp. rate 24, height 5' 3" (1.6 m), weight 109 lb 6.4  oz (49.624 kg), SpO2 97 %.  Filed Weights   12/04/2015 1900 12/15/2015 0352  Weight: 109 lb 5.6 oz (49.6 kg) 109 lb 6.4 oz (49.624 kg)    Labs & Radiologic Studies     CBC  Recent Labs  12/09/2015 1941 2015-12-15 0100  WBC 9.2 9.4  NEUTROABS 7.4  --   HGB 11.4* 11.1*  HCT 33.4* 32.2*  MCV 83.3 81.3  PLT 234 440   Basic Metabolic Panel  Recent Labs  12/13/2015 1941 2015-12-15 0100  NA 131* 128*  K 4.4 4.6  CL 97*  92*  CO2 26 22  GLUCOSE 73 321*  BUN 17 23*  CREATININE 0.67 0.95  CALCIUM 9.0 8.8*  MG 2.2  --    Liver Function Tests  Recent Labs  12/16/2015 1941 December 15, 2015 0100  AST 77* 90*  ALT 119* 123*  ALKPHOS 156* 202*  BILITOT 0.7 0.6  PROT 6.0* 5.5*  ALBUMIN 3.3* 3.3*   Cardiac Enzymes  Recent Labs  12/25/2015 1941 12/15/15 0100 12-15-15 1030  TROPONINI 9.04* 8.30* 8.61*   Thyroid Function Tests  Recent Labs  12/08/2015 1941  TSH 1.515    Dg Chest Port 1 View  2015/12/15  CLINICAL DATA:  Status post intubation, patient coding EXAM: PORTABLE CHEST 1 VIEW COMPARISON:  12/01/2015 FINDINGS: Stable cardiac enlargement. Endotracheal tube about 2.5-3 cm above the carina. Severe bilateral interstitial and acinar infiltrates. IMPRESSION: Severe bilateral airspace disease consistent with pulmonary edema. Endotracheal tube as described. Electronically Signed   By: Skipper Cliche M.D.   On: 12/15/15 15:51   Dg Chest Port 1 View  12/30/2015  CLINICAL DATA:  Chest pain shortness of breath for 3 days EXAM: PORTABLE CHEST 1 VIEW COMPARISON:  12/13/2015 FINDINGS: Stable cardiomegaly and moderate interstitial change. Stable hazy attenuation in both lower lobes with small pleural effusions. IMPRESSION: No significant change in findings again consistent with congestive heart failure. Electronically Signed   By: Skipper Cliche M.D.   On: 12/04/2015 21:31   See above for [facility].  Disposition   Patient expired 12/05/14.   Follow-up Plans & Appointments   Not applicable.   Discharge Medications   Not applicable.   Duration of Discharge Encounter   Greater than 30 minutes including physician time.  Signed, Charlie Pitter PA-C 12/08/2015, 5:28 PM

## 2015-12-31 NOTE — Progress Notes (Signed)
    Subjective:  Denies CP; dyspnea improving; complains of back pain and poor food   Objective:  Filed Vitals:   12-18-2015 0600 12-18-2015 0700 12-18-2015 0800 12-18-2015 0804  BP: 114/71 99/67 105/68 105/68  Pulse: 88 71 76 79  Temp:    97.5 F (36.4 C)  TempSrc:    Oral  Resp: 18 16 21 18   Height:      Weight:      SpO2: 100% 100% 100% 99%    Intake/Output from previous day:  Intake/Output Summary (Last 24 hours) at 12-18-2015 0855 Last data filed at 12-18-2015 0800  Gross per 24 hour  Intake 664.18 ml  Output   1000 ml  Net -335.82 ml    Physical Exam: Physical exam: Well-developed well-nourished in no acute distress.  Skin is warm and dry.  HEENT is normal.  Neck is supple.  Chest mildly diminished BS bases Cardiovascular exam is regular rate and rhythm.  Abdominal exam nontender or distended. No masses palpated. Extremities show 1+ ankle edema. neuro grossly intact    Lab Results: Basic Metabolic Panel:  Recent Labs  16/08/9600/30/2017 1941 12-18-2015 0100  NA 131* 128*  K 4.4 4.6  CL 97* 92*  CO2 26 22  GLUCOSE 73 321*  BUN 17 23*  CREATININE 0.67 0.95  CALCIUM 9.0 8.8*  MG 2.2  --    CBC:  Recent Labs  12/10/2015 1720 12/05/15 1941 12-18-2015 0100  WBC 8.0 9.2 9.4  NEUTROABS 6.2 7.4  --   HGB 11.1* 11.4* 11.1*  HCT 32.4* 33.4* 32.2*  MCV 82.7 83.3 81.3  PLT 231 234 208   Cardiac Enzymes:  Recent Labs  12/05/15 1941 12-18-2015 0100  TROPONINI 9.04* 8.30*     Assessment/Plan:  1 acute systolic congestive heart failure-Patient remains volume overloaded. Will treat with Lasix 40 mg IV daily. Follow renal function closely. 2 ischemic cardiomyopathy-LV function newly reduced. Plan to continue low-dose beta blockade but will change to carvedilol. Add lisinopril 2.5 mg daily. Titrate medications as tolerated by pulse and blood pressure. Patient will need cardiac catheterization once CHF improves. 3 non-ST elevation myocardial infarction-troponins are  abnormal. We will plan to proceed with cardiac catheterization early next week when congestive heart failure improves. Add statin. Continue aspirin, heparin and beta blocker. 4 COPD-continue bronchodilators. Patient appears to have bronchitis. Add Levaquin. Note allergy to penicillin, sulfa, doxycycline and azithromycin. 5 hyponatremia-possible contribution from elevated glucose. Continue to follow with diuresis. 6 elevated liver functions-follow.   7 diabetes mellitus follow CBGs.  Melissa MillersBrian Hadiya Spoerl 12/23/2015, 8:55 AM

## 2015-12-31 NOTE — Progress Notes (Addendum)
Pt asystolic. Heart auscultated with 2 RNs. No heart sounds noted. Pt family notified.  Malachi ProKristina Wolf RN   Heart sounds absent.  Respirations ceased.  No pulse.  Verified death with Malachi ProKristina Wolf, RN  Marge K. Meryl CrutchLessard, RN

## 2015-12-31 DEATH — deceased

## 2016-05-20 ENCOUNTER — Encounter (HOSPITAL_COMMUNITY): Payer: Medicare Other

## 2016-05-20 ENCOUNTER — Ambulatory Visit: Payer: Medicare Other | Admitting: Vascular Surgery
